# Patient Record
Sex: Male | Born: 1950 | Race: White | Hispanic: No | Smoking: Former smoker
Health system: Southern US, Community
[De-identification: ages and names within clinical notes are randomized; demographics above are authoritative.]

## PROBLEM LIST (undated history)

## (undated) DIAGNOSIS — M542 Cervicalgia: Secondary | ICD-10-CM

## (undated) DIAGNOSIS — I480 Paroxysmal atrial fibrillation: Secondary | ICD-10-CM

## (undated) DIAGNOSIS — M109 Gout, unspecified: Secondary | ICD-10-CM

## (undated) DIAGNOSIS — K219 Gastro-esophageal reflux disease without esophagitis: Secondary | ICD-10-CM

## (undated) DIAGNOSIS — E119 Type 2 diabetes mellitus without complications: Secondary | ICD-10-CM

## (undated) DIAGNOSIS — I340 Nonrheumatic mitral (valve) insufficiency: Secondary | ICD-10-CM

## (undated) DIAGNOSIS — L570 Actinic keratosis: Secondary | ICD-10-CM

## (undated) DIAGNOSIS — I729 Aneurysm of unspecified site: Secondary | ICD-10-CM

## (undated) DIAGNOSIS — I10 Essential (primary) hypertension: Secondary | ICD-10-CM

## (undated) DIAGNOSIS — F1010 Alcohol abuse, uncomplicated: Secondary | ICD-10-CM

## (undated) DIAGNOSIS — I499 Cardiac arrhythmia, unspecified: Secondary | ICD-10-CM

## (undated) DIAGNOSIS — I509 Heart failure, unspecified: Secondary | ICD-10-CM

## (undated) DIAGNOSIS — E785 Hyperlipidemia, unspecified: Secondary | ICD-10-CM

## (undated) DIAGNOSIS — G8929 Other chronic pain: Secondary | ICD-10-CM

## (undated) DIAGNOSIS — I63539 Cerebral infarction due to unspecified occlusion or stenosis of unspecified posterior cerebral artery: Secondary | ICD-10-CM

## (undated) HISTORY — PX: KNEE ARTHROSCOPY: SHX127

## (undated) HISTORY — DX: Heart failure, unspecified: I50.9

## (undated) HISTORY — DX: Hyperlipidemia, unspecified: E78.5

## (undated) HISTORY — DX: Cerebral infarction due to unspecified occlusion or stenosis of unspecified posterior cerebral artery: I63.539

## (undated) HISTORY — DX: Aneurysm of unspecified site: I72.9

## (undated) HISTORY — PX: SKIN CANCER EXCISION: SHX779

## (undated) HISTORY — DX: Actinic keratosis: L57.0

## (undated) HISTORY — PX: CARDIAC CATHETERIZATION: SHX172

---

## 2011-02-10 ENCOUNTER — Other Ambulatory Visit (HOSPITAL_COMMUNITY): Payer: Self-pay | Admitting: Chiropractic Medicine

## 2011-02-10 DIAGNOSIS — M79603 Pain in arm, unspecified: Secondary | ICD-10-CM

## 2011-02-10 DIAGNOSIS — M542 Cervicalgia: Secondary | ICD-10-CM

## 2011-02-13 ENCOUNTER — Ambulatory Visit (HOSPITAL_COMMUNITY)
Admission: RE | Admit: 2011-02-13 | Discharge: 2011-02-13 | Disposition: A | Discharge status: Home / Self Care | Payer: No Typology Code available for payment source | Source: Ambulatory Visit | Attending: Chiropractic Medicine | Admitting: Chiropractic Medicine

## 2011-02-13 DIAGNOSIS — M79603 Pain in arm, unspecified: Secondary | ICD-10-CM

## 2011-02-13 DIAGNOSIS — M542 Cervicalgia: Secondary | ICD-10-CM

## 2011-02-13 DIAGNOSIS — M47812 Spondylosis without myelopathy or radiculopathy, cervical region: Secondary | ICD-10-CM | POA: Insufficient documentation

## 2012-01-17 ENCOUNTER — Inpatient Hospital Stay (HOSPITAL_COMMUNITY): Payer: Self-pay

## 2012-01-17 ENCOUNTER — Encounter (HOSPITAL_COMMUNITY): Payer: Self-pay | Admitting: *Deleted

## 2012-01-17 ENCOUNTER — Emergency Department (HOSPITAL_COMMUNITY): Payer: Self-pay

## 2012-01-17 ENCOUNTER — Inpatient Hospital Stay (HOSPITAL_COMMUNITY)
Admission: EM | Admit: 2012-01-17 | Discharge: 2012-01-20 | DRG: 065 | Disposition: A | Discharge status: Home / Self Care | Payer: Self-pay | Attending: Internal Medicine | Admitting: Internal Medicine

## 2012-01-17 DIAGNOSIS — F1010 Alcohol abuse, uncomplicated: Secondary | ICD-10-CM

## 2012-01-17 DIAGNOSIS — I63539 Cerebral infarction due to unspecified occlusion or stenosis of unspecified posterior cerebral artery: Secondary | ICD-10-CM

## 2012-01-17 DIAGNOSIS — K219 Gastro-esophageal reflux disease without esophagitis: Secondary | ICD-10-CM | POA: Diagnosis present

## 2012-01-17 DIAGNOSIS — M542 Cervicalgia: Secondary | ICD-10-CM | POA: Diagnosis present

## 2012-01-17 DIAGNOSIS — E785 Hyperlipidemia, unspecified: Secondary | ICD-10-CM | POA: Diagnosis present

## 2012-01-17 DIAGNOSIS — Z85828 Personal history of other malignant neoplasm of skin: Secondary | ICD-10-CM

## 2012-01-17 DIAGNOSIS — I63531 Cerebral infarction due to unspecified occlusion or stenosis of right posterior cerebral artery: Secondary | ICD-10-CM | POA: Diagnosis present

## 2012-01-17 DIAGNOSIS — I509 Heart failure, unspecified: Secondary | ICD-10-CM | POA: Diagnosis present

## 2012-01-17 DIAGNOSIS — I5042 Chronic combined systolic (congestive) and diastolic (congestive) heart failure: Secondary | ICD-10-CM | POA: Diagnosis present

## 2012-01-17 DIAGNOSIS — Z79899 Other long term (current) drug therapy: Secondary | ICD-10-CM

## 2012-01-17 DIAGNOSIS — G8929 Other chronic pain: Secondary | ICD-10-CM | POA: Diagnosis present

## 2012-01-17 DIAGNOSIS — R29810 Facial weakness: Secondary | ICD-10-CM | POA: Diagnosis present

## 2012-01-17 DIAGNOSIS — I10 Essential (primary) hypertension: Secondary | ICD-10-CM

## 2012-01-17 DIAGNOSIS — I639 Cerebral infarction, unspecified: Secondary | ICD-10-CM

## 2012-01-17 DIAGNOSIS — H53469 Homonymous bilateral field defects, unspecified side: Secondary | ICD-10-CM | POA: Diagnosis present

## 2012-01-17 DIAGNOSIS — I7090 Unspecified atherosclerosis: Secondary | ICD-10-CM | POA: Diagnosis present

## 2012-01-17 DIAGNOSIS — F05 Delirium due to known physiological condition: Secondary | ICD-10-CM | POA: Diagnosis present

## 2012-01-17 DIAGNOSIS — H9319 Tinnitus, unspecified ear: Secondary | ICD-10-CM | POA: Diagnosis present

## 2012-01-17 DIAGNOSIS — I6789 Other cerebrovascular disease: Secondary | ICD-10-CM

## 2012-01-17 DIAGNOSIS — M549 Dorsalgia, unspecified: Secondary | ICD-10-CM | POA: Diagnosis present

## 2012-01-17 DIAGNOSIS — M109 Gout, unspecified: Secondary | ICD-10-CM | POA: Diagnosis present

## 2012-01-17 DIAGNOSIS — I6350 Cerebral infarction due to unspecified occlusion or stenosis of unspecified cerebral artery: Principal | ICD-10-CM | POA: Diagnosis present

## 2012-01-17 DIAGNOSIS — R279 Unspecified lack of coordination: Secondary | ICD-10-CM | POA: Diagnosis present

## 2012-01-17 HISTORY — DX: Alcohol abuse, uncomplicated: F10.10

## 2012-01-17 HISTORY — DX: Gout, unspecified: M10.9

## 2012-01-17 HISTORY — DX: Cervicalgia: M54.2

## 2012-01-17 HISTORY — DX: Gastro-esophageal reflux disease without esophagitis: K21.9

## 2012-01-17 HISTORY — DX: Other chronic pain: G89.29

## 2012-01-17 HISTORY — DX: Essential (primary) hypertension: I10

## 2012-01-17 LAB — COMPREHENSIVE METABOLIC PANEL
AST: 23 U/L (ref 0–37)
BUN: 11 mg/dL (ref 6–23)
CO2: 27 mEq/L (ref 19–32)
Calcium: 9.9 mg/dL (ref 8.4–10.5)
Creatinine, Ser: 0.87 mg/dL (ref 0.50–1.35)
GFR calc Af Amer: >90 mL/min (ref >90)
GFR calc non Af Amer: >90 mL/min (ref >90)
Total Bilirubin: 0.5 mg/dL (ref 0.3–1.2)

## 2012-01-17 LAB — CBC WITH DIFFERENTIAL/PLATELET
Hemoglobin: 15.6 g/dL (ref 13.0–17.0)
Lymphocytes Relative: 27 % (ref 12–46)
Lymphs Abs: 3.1 10*3/uL (ref 0.7–4.0)
MCH: 34.2 pg — ABNORMAL HIGH (ref 26.0–34.0)
Monocytes Relative: 7 % (ref 3–12)
Neutro Abs: 7.3 10*3/uL (ref 1.7–7.7)
Neutrophils Relative %: 64 % (ref 43–77)
Platelets: 352 10*3/uL (ref 150–400)
RBC: 4.56 MIL/uL (ref 4.22–5.81)
WBC: 11.4 10*3/uL — ABNORMAL HIGH (ref 4.0–10.5)

## 2012-01-17 LAB — ETHANOL: Alcohol, Ethyl (B): <11 mg/dL (ref 0–11)

## 2012-01-17 LAB — GLUCOSE, CAPILLARY: Glucose-Capillary: 119 mg/dL — ABNORMAL HIGH (ref 70–99)

## 2012-01-17 MED ORDER — SODIUM CHLORIDE 0.9 % IV SOLN
INTRAVENOUS | Status: DC
  Administered 2012-01-17: 18:00:00 via INTRAVENOUS
  Administered 2012-01-18: 1000 mL via INTRAVENOUS

## 2012-01-17 MED ORDER — SENNOSIDES-DOCUSATE SODIUM 8.6-50 MG PO TABS: 1.0000 | ORAL_TABLET | Freq: Every evening | ORAL | Status: DC | PRN

## 2012-01-17 MED ORDER — ASPIRIN 81 MG PO CHEW
81.0000 mg | CHEWABLE_TABLET | Freq: Once | ORAL | Status: AC
  Administered 2012-01-17: 81 mg via ORAL
  Filled 2012-01-17: qty 1

## 2012-01-17 MED ORDER — HEPARIN SODIUM (PORCINE) 5000 UNIT/ML IJ SOLN
5000.0000 [IU] | Freq: Three times a day (TID) | INTRAMUSCULAR | Status: DC
  Administered 2012-01-17 – 2012-01-19 (×7): 5000 [IU] via SUBCUTANEOUS
  Filled 2012-01-17 (×12): qty 1

## 2012-01-17 MED ORDER — ADULT MULTIVITAMIN W/MINERALS CH
1.0000 | ORAL_TABLET | Freq: Every day | ORAL | Status: DC
  Administered 2012-01-17 – 2012-01-20 (×4): 1 via ORAL
  Filled 2012-01-17 (×4): qty 1

## 2012-01-17 MED ORDER — THIAMINE HCL 100 MG/ML IJ SOLN
100.0000 mg | Freq: Every day | INTRAMUSCULAR | Status: DC
  Filled 2012-01-17 (×4): qty 1

## 2012-01-17 MED ORDER — LORAZEPAM 1 MG PO TABS: 1.0000 mg | ORAL_TABLET | Freq: Four times a day (QID) | ORAL | Status: AC | PRN

## 2012-01-17 MED ORDER — ONDANSETRON HCL 4 MG/2ML IJ SOLN: 4.0000 mg | Freq: Four times a day (QID) | INTRAMUSCULAR | Status: DC | PRN

## 2012-01-17 MED ORDER — ACETAMINOPHEN 325 MG PO TABS: 650.0000 mg | ORAL_TABLET | ORAL | Status: DC | PRN

## 2012-01-17 MED ORDER — LORAZEPAM 2 MG/ML IJ SOLN: 1.0000 mg | Freq: Four times a day (QID) | INTRAMUSCULAR | Status: AC | PRN

## 2012-01-17 MED ORDER — HYDROCODONE-ACETAMINOPHEN 5-325 MG PO TABS
1.0000 | ORAL_TABLET | Freq: Four times a day (QID) | ORAL | Status: DC | PRN
  Administered 2012-01-17 – 2012-01-20 (×9): 2 via ORAL
  Filled 2012-01-17 (×11): qty 2

## 2012-01-17 MED ORDER — GABAPENTIN 300 MG PO CAPS
600.0000 mg | ORAL_CAPSULE | Freq: Two times a day (BID) | ORAL | Status: DC
  Administered 2012-01-17 – 2012-01-20 (×7): 600 mg via ORAL
  Filled 2012-01-17 (×8): qty 2

## 2012-01-17 MED ORDER — ACETAMINOPHEN 650 MG RE SUPP: 650.0000 mg | RECTAL | Status: DC | PRN

## 2012-01-17 MED ORDER — ASPIRIN 81 MG PO CHEW
81.0000 mg | CHEWABLE_TABLET | Freq: Every day | ORAL | Status: DC
  Administered 2012-01-17 – 2012-01-18 (×2): 81 mg via ORAL
  Filled 2012-01-17 (×3): qty 1

## 2012-01-17 MED ORDER — CYCLOBENZAPRINE HCL 10 MG PO TABS
10.0000 mg | ORAL_TABLET | Freq: Once | ORAL | Status: AC
  Administered 2012-01-17: 10 mg via ORAL

## 2012-01-17 MED ORDER — VITAMIN B-1 100 MG PO TABS
100.0000 mg | ORAL_TABLET | Freq: Every day | ORAL | Status: DC
  Administered 2012-01-17 – 2012-01-20 (×4): 100 mg via ORAL
  Filled 2012-01-17 (×4): qty 1

## 2012-01-17 MED ORDER — CYCLOBENZAPRINE HCL 10 MG PO TABS
10.0000 mg | ORAL_TABLET | Freq: Every morning | ORAL | Status: DC
  Administered 2012-01-18 – 2012-01-20 (×3): 10 mg via ORAL
  Filled 2012-01-17 (×6): qty 1

## 2012-01-17 MED ORDER — ASPIRIN 300 MG RE SUPP
300.0000 mg | Freq: Every day | RECTAL | Status: DC
  Filled 2012-01-17: qty 1

## 2012-01-17 MED ORDER — ASPIRIN 325 MG PO TABS: 325.0000 mg | ORAL_TABLET | Freq: Every day | ORAL | Status: DC

## 2012-01-17 MED ORDER — FOLIC ACID 1 MG PO TABS
1.0000 mg | ORAL_TABLET | Freq: Every day | ORAL | Status: DC
  Administered 2012-01-17 – 2012-01-20 (×4): 1 mg via ORAL
  Filled 2012-01-17 (×4): qty 1

## 2012-01-17 MED ORDER — GABAPENTIN 300 MG PO CAPS
300.0000 mg | ORAL_CAPSULE | Freq: Every day | ORAL | Status: DC
  Administered 2012-01-17 – 2012-01-19 (×3): 300 mg via ORAL
  Filled 2012-01-17 (×4): qty 1

## 2012-01-17 NOTE — ED Notes (Signed)
Pt c/o equilibrium being off with slight dizziness. Pt c/o intermittent double vision, reports he had same issue about 10 days ago. Pt c/o neck pain 8/10, pt takes gabapentin and flexeril for neck pain daily. Equal hand grips, no facial droop. Pt reports he has also been having trouble walking since yesterday morning.

## 2012-01-17 NOTE — ED Notes (Signed)
Pt back from CT
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
## 2012-01-17 NOTE — Consult Note (Signed)
Referring Physician:  ED physician Chief Complaint:  Vision loss, ataxia, sensory loss LSN: yesterday 8: 00 AM HPI: This is a 61 y/o RHWM who has been having visual changes for past several days and it got worse yesterday and had some balance and  Co ordination issues. He did not seek medical help  And upon questioning he mentioned because he does not have health insurance. He mentioned he had some speech impairment. But his main concern was word finding difficulty, disoriented and confused around the house. He comes in today and head CT indicates PCA territory ischemic stroke He does not take ASA at home. He denied chest pain, no sob, no abdominal pain, no nausea, no vomiting, no tinnitus, no hearing loss, no speech impariment ( resolved), ataxia, poor coordination, no tick bites, no dysphagia, positive for visual changes.   tPA Given: No Because: 1) Outside the tPA window 2) head CT positive for stroke   Past Medical History  Diagnosis Date  . Hypertension   . Alcohol abuse     . Chronic back pain  History reviewed. No pertinent past surgical history.  Family history: Father died of emphysema, mother died of DVT     Social History:  reports that he has quit smoking. He does not have any smokeless tobacco history on file. He reports that he drinks alcohol. He reports that he does not use illicit drugs.  He is not married and lives alone, he works in [employer]  Allergies: No Known Allergies  Medications:  Scheduled:   . aspirin  81 mg Oral Once    ROS: All the 14 point review of systems were reviewed and pertinent are mentioned in the HPI  Physical Examination: Blood pressure 166/90, pulse 99, temperature 98.2 F (36.8 C), temperature source Oral, resp. rate 30, SpO2 97.00%.   Physical Exam: General: Awake, alert and follows commands HEENT:   EOMI, PEERLA  ( pupils are sluggish) , no nystagmus,  Mild ptosis on the left  Neck: Supple, no  JVD, no Carotid bruit, No lAD Lungs; CTA Heart; RRR,, no murmurs, rubs and or gallops Abdomen: bowel sounds present Skin: no rash Extremities : No edema   Neuro:  Mental Status: Alert, oriented, thought content appropriate.   Speech fluent without evidence of aphasia. Verbal Dyslexia without agraphia  Able to follow 3 step commands without difficulty.   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  No dysphagia, Cough and GAG + Tongue mildly deviated to the left NO HORNER SYNDROME   Motor: Right : Upper extremity   5/5    Left:     Upper extremity   4+/5 with drift  Lower extremity   5/5     Lower extremity   4+ /5  With drift Tone and bulk:normal tone throughout; no atrophy noted Sensory: Pinprick and light touch diminished on the right side  Including the face Deep Tendon Reflexes: 2+ and symmetric throughout Plantars: Right: downgoing   Left: downgoing Cerebellar: Intention tremor  And mild hemiparesis on the left,  ( secondary to posteroventral nucleus of thalamus involvment) also cerebral peduncle involvement    Laboratory Studies:  Basic Metabolic Panel:  Lab 01/17/12 4540  NA 138  K 3.7  CL 99  CO2 27  GLUCOSE 118*  BUN 11  CREATININE 0.87  CALCIUM 9.9  MG --  PHOS --    Liver Function Tests:  Lab 01/17/12 1206  AST 23  ALT 20  ALKPHOS 83  BILITOT 0.5  PROT 7.5  ALBUMIN 4.1   No results found for this basename: LIPASE:5,AMYLASE:5 in the last 168 hours No results found for this basename: AMMONIA:3 in the last 168 hours  CBC:  Lab 01/17/12 1206  WBC 11.4*  NEUTROABS 7.3  HGB 15.6  HCT 44.1  MCV 96.7  PLT 352    Cardiac Enzymes: No results found for this basename: CKTOTAL:5,CKMB:5,CKMBINDEX:5,TROPONINI:5 in the last 168  hours  BNP: No components found with this basename: POCBNP:5  CBG:  Lab 01/17/12 1153  GLUCAP 119*    Microbiology: No results found for this or any previous visit.  Coagulation Studies: No results found for this basename: LABPROT:5,INR:5 in the last 72 hours  Urinalysis: No results found for this basename: COLORURINE:2,APPERANCEUR:2,LABSPEC:2,PHURINE:2,GLUCOSEU:2,HGBUR:2,BILIRUBINUR:2,KETONESUR:2,PROTEINUR:2,UROBILINOGEN:2,NITRITE:2,LEUKOCYTESUR:2 in the last 168 hours  Lipid Panel: No results found for this basename: chol, trig, hdl, cholhdl, vldl, ldlcalc    HgbA1C:  No results found for this basename: HGBA1C    Urine Drug Screen:   No results found for this basename: labopia, cocainscrnur, labbenz, amphetmu, thcu, labbarb    Alcohol Level:  Lab 01/17/12 1206  ETH <11    Other results: EKG: normal EKG, normal sinus rhythm, unchanged from previous tracings.  Imaging: Ct Head Wo Contrast  01/17/2012  *RADIOLOGY REPORT*  Clinical Data:  visual disturbance  CT HEAD WITHOUT CONTRAST  Technique:  Contiguous axial images were obtained from the base of the skull through the vertex without contrast.  Comparison: None.  Findings: Wedge-shaped hypodense area in the right occipital lobe posteriorly with loss of gray-white matter differentiation and mild localized mass effect.  Appearance compatible with a subacute right PCA territory infarct.  No associated hemorrhage.  Cisterns patent. No hydrocephalus or extra-axial fluid collection.  No cerebellar abnormality demonstrated.  Orbits symmetric.  Mastoids and sinuses clear.  IMPRESSION: Subacute right PCA territory infarct.  Critical Value/emergent results were called by telephone at the time of interpretation on 01/17/2012 at 12:30 p.m. to Dr. Rulon Abide, who verbally acknowledged these results.   Original Report Authenticated By: Judie Petit. Ruel Favors, M.D.     Assessment: Patient with Right PCA ischemic stroke on CT. Not a tPA candidate,  Does not take ASA at home  Recommendations: 1) Admit for stroke work up 2) MRI of the brain, MRA of the head and neck,  TTE,   3) ASA 81 mg daily 4) Do not treat the bp unless it is > 220/120 for the next 48 hours. His symptoms started 24 hours ago 5) Stroke labs, 6) PT/OT/ST 7) fall and aspiration precautions   Approximately 60 minutes spent with the patient  Maxon Kresse V-P Eilleen Kempf., MD., Ph.D.,MS 01/17/2012 2:07 PM

## 2012-01-17 NOTE — ED Notes (Addendum)
Dr. Parekh at the bedside.  

## 2012-01-17 NOTE — ED Notes (Signed)
Pt in radiology 

## 2012-01-18 ENCOUNTER — Inpatient Hospital Stay (HOSPITAL_COMMUNITY): Payer: Self-pay

## 2012-01-18 DIAGNOSIS — I63539 Cerebral infarction due to unspecified occlusion or stenosis of unspecified posterior cerebral artery: Secondary | ICD-10-CM | POA: Diagnosis present

## 2012-01-18 DIAGNOSIS — E785 Hyperlipidemia, unspecified: Secondary | ICD-10-CM

## 2012-01-18 DIAGNOSIS — M4802 Spinal stenosis, cervical region: Secondary | ICD-10-CM | POA: Insufficient documentation

## 2012-01-18 DIAGNOSIS — I729 Aneurysm of unspecified site: Secondary | ICD-10-CM | POA: Insufficient documentation

## 2012-01-18 DIAGNOSIS — I6789 Other cerebrovascular disease: Secondary | ICD-10-CM

## 2012-01-18 DIAGNOSIS — I6350 Cerebral infarction due to unspecified occlusion or stenosis of unspecified cerebral artery: Principal | ICD-10-CM

## 2012-01-18 HISTORY — DX: Cerebral infarction due to unspecified occlusion or stenosis of unspecified posterior cerebral artery: I63.539

## 2012-01-18 HISTORY — DX: Aneurysm of unspecified site: I72.9

## 2012-01-18 HISTORY — DX: Hyperlipidemia, unspecified: E78.5

## 2012-01-18 LAB — URINALYSIS, ROUTINE W REFLEX MICROSCOPIC
Glucose, UA: NEGATIVE mg/dL
Ketones, ur: NEGATIVE mg/dL
Leukocytes, UA: NEGATIVE
Nitrite: NEGATIVE
Specific Gravity, Urine: 1.02 (ref 1.005–1.030)
pH: 6.5 (ref 5.0–8.0)

## 2012-01-18 LAB — BASIC METABOLIC PANEL
CO2: 27 mEq/L (ref 19–32)
Chloride: 103 mEq/L (ref 96–112)
Creatinine, Ser: 0.92 mg/dL (ref 0.50–1.35)
Glucose, Bld: 93 mg/dL (ref 70–99)
Sodium: 138 mEq/L (ref 135–145)

## 2012-01-18 LAB — LIPID PANEL
LDL Cholesterol: 121 mg/dL — ABNORMAL HIGH (ref 0–99)
Total CHOL/HDL Ratio: 6.1 RATIO
VLDL: 48 mg/dL — ABNORMAL HIGH (ref 0–40)

## 2012-01-18 LAB — CBC WITH DIFFERENTIAL/PLATELET
Eosinophils Relative: 3 % (ref 0–5)
HCT: 42.4 % (ref 39.0–52.0)
Lymphs Abs: 3.4 10*3/uL (ref 0.7–4.0)
MCV: 97 fL (ref 78.0–100.0)
Monocytes Relative: 9 % (ref 3–12)
Neutro Abs: 5.8 10*3/uL (ref 1.7–7.7)
RBC: 4.37 MIL/uL (ref 4.22–5.81)
WBC: 10.5 10*3/uL (ref 4.0–10.5)

## 2012-01-18 LAB — TSH: TSH: 1.642 u[IU]/mL (ref 0.350–4.500)

## 2012-01-18 LAB — HEMOGLOBIN A1C
Hgb A1c MFr Bld: 5.7 % — ABNORMAL HIGH (ref <5.7)
Mean Plasma Glucose: 117 mg/dL — ABNORMAL HIGH (ref <117)

## 2012-01-18 LAB — RAPID URINE DRUG SCREEN, HOSP PERFORMED: Barbiturates: NOT DETECTED

## 2012-01-18 MED ORDER — SIMVASTATIN 20 MG PO TABS
20.0000 mg | ORAL_TABLET | Freq: Every day | ORAL | Status: DC
  Administered 2012-01-18 – 2012-01-20 (×3): 20 mg via ORAL
  Filled 2012-01-18 (×3): qty 1

## 2012-01-18 MED ORDER — CLOPIDOGREL BISULFATE 75 MG PO TABS
75.0000 mg | ORAL_TABLET | Freq: Every day | ORAL | Status: DC
  Administered 2012-01-18 – 2012-01-20 (×3): 75 mg via ORAL
  Filled 2012-01-18 (×5): qty 1
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
## 2012-01-18 NOTE — Evaluation (Signed)
Occupational Therapy Evaluation Patient Details Name: Austin Andrade MRN: [id number] DOB: [birth year] Today's Date: 01/18/2012 Time: 1478-2956 OT Time Calculation (min): 24 min  OT Assessment / Plan / Recommendation Clinical Impression  Pt admitted with ataxia, sensory loss and visual changes.  Per MD note, MRI findings show: Concurrent tiny small acute infarct in the left cerebellum.Right PCA is occluded just beyond its origin with no reconstituted flow. 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 Right vertebral artery terminates in PICA.  Will benefit from acute OT services to address below problem list in prep for d/c home.    OT Assessment  Patient needs continued OT Services    Follow Up Recommendations  Outpatient OT    Barriers to Discharge Decreased caregiver support Pt unsure of level of assist he can obtain from friends/family but states he thinks he can have someone drive him to OPOT.  Equipment Recommendations  None recommended by OT    Recommendations for Other Services    Frequency  Min 2X/week    Precautions / Restrictions Precautions Precautions:  (left visual field deficit)   Pertinent Vitals/Pain See vitals    ADL  Grooming: Performed;Wash/dry hands;Supervision/safety Where Assessed - Grooming: Unsupported standing Toilet Transfer: Min guard;Performed Statistician Method: Sit to Barista: Comfort height toilet Toileting - Architect and Hygiene: Performed;Supervision/safety Where Assessed - Engineer, mining and Hygiene: Standing Equipment Used: Gait belt Transfers/Ambulation Related to ADLs: min guard during ambulation.  Slight LOB to left side when looking right.  ADL Comments: Pt is near baseline but demonstrates balance deficits during functional transfers as well as left visual field deficit. Edcuated pt on  safety concern with visual deficit as well as to wait until cleared by MD to begin driving.    OT Diagnosis: Disturbance of vision;Generalized weakness;Paresis  OT Problem List: Decreased activity tolerance;Impaired balance (sitting and/or standing);Impaired vision/perception;Impaired sensation OT Treatment Interventions: Self-care/ADL training;Therapeutic activities;Visual/perceptual remediation/compensation;Patient/family education;Balance training   OT Goals Acute Rehab OT Goals OT Goal Formulation: With patient Time For Goal Achievement: 01/25/12 Potential to Achieve Goals: Good ADL Goals Pt Will Transfer to Toilet: Independently;Ambulation;Regular height toilet ADL Goal: Toilet Transfer - Progress: Goal set today Miscellaneous OT Goals Miscellaneous OT Goal #1: Pt will independently retrieve ADL items located throughout room with no LOB. OT Goal: Miscellaneous Goal #1 - Progress: Goal set today Miscellaneous OT Goal #2: Pt will independently use visual compensatory strategies at all times to locate objects in environment. OT Goal: Miscellaneous Goal #2 - Progress: Goal set today  Visit Information  Last OT Received On: 01/18/12 Assistance Needed: +1    Subjective Data  Subjective: I feel sort of dizzy.   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) Communication Communication: No difficulties Dominant Hand: Right           Vision/Perception Vision - Assessment Eye Alignment: Within Functional Limits Vision Assessment: Vision tested Tracking/Visual Pursuits: Decreased smoothness of eye movement to LEFT superior field;Decreased smoothness of eye  movement to LEFT inferior field Saccades: Decreased speed of saccadic movement Visual Fields: Left homonymous hemianopsia;Impaired - to be further tested in functional context Diplopia Assessment:  (not present during this session but has occured recently) Additional Comments: Pt with possible left homonymous hemianopia.  Will continue to assess. Educated pt to scan environment thoroughly by turning head to left to ensure safety and to locate all objects.   Cognition  Overall Cognitive Status: Appears within functional limits for tasks assessed/performed Arousal/Alertness: Awake/alert Orientation Level: Oriented X4 / Intact Behavior During Session: [facility] for tasks performed    Extremity/Trunk Assessment Right Upper Extremity Assessment RUE ROM/Strength/Tone: Within functional levels RUE Sensation: WFL - Light Touch;WFL - Proprioception RUE Coordination: WFL - gross/fine motor Left Upper Extremity Assessment LUE ROM/Strength/Tone: Within functional levels LUE Sensation: Deficits LUE Sensation Deficits: numbness and tingling LUE Coordination: WFL - gross/fine motor     Mobility Bed Mobility Bed Mobility: Supine to Sit;Sitting - Scoot to Edge of Bed Supine to Sit: 7: Independent Sitting - Scoot to Edge of Bed: 7: Independent Transfers Sit to Stand: 5: Supervision;Without upper extremity assist;From bed;From chair/3-in-1 Stand to Sit: 5: Supervision;Without upper extremity assist;To chair/3-in-1 Details for Transfer Assistance: supervision due to reports of head feeling "wishy-washy"     Shoulder Instructions     Exercise     Balance     End of Session OT - End of Session Equipment Utilized During Treatment: Gait belt Activity Tolerance: Patient tolerated  treatment well Patient left: in chair;with call bell/phone within reach (with PT) Nurse Communication: Mobility status  GO   01/18/2012 Austin Andrade OTR/L Pager [phone] Office [phone]   Austin Andrade 01/18/2012, 4:39 PM

## 2012-01-18 NOTE — Progress Notes (Addendum)
BP remains elevated. Dr Shirlee Latch (medical resident with Dr Orvan Falconer) aware, pt BP must be untreated first 48hrs. No orders at this time.

## 2012-01-18 NOTE — Progress Notes (Signed)
  Echocardiogram 2D Echocardiogram has been performed.  Austin Andrade 01/18/2012, 9:26 AM
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
## 2012-01-18 NOTE — Progress Notes (Signed)
Patient ID: Austin Andrade, male   DOB: May 15, 1950, 61 y.o.   MRN: [id number]    Internal Medicine Teaching Program   Date of Admission:  01/17/2012     Principal Problem:  *Acute ischemic multifocal posterior circulation stroke Active Problems:  Left homonymous hemianopsia  Sensory deficit, left  Hypertension  Chronic neck pain  H/O alcohol abuse  Dyslipidemia      . aspirin  81 mg Oral Once  . clopidogrel  75 mg Oral Q breakfast  . cyclobenzaprine  10 mg Oral q morning - 10a  . cyclobenzaprine  10 mg Oral Once  . folic acid  1 mg Oral Daily  . gabapentin  300 mg Oral QHS  . gabapentin  600 mg Oral BID WC  . heparin  5,000 Units Subcutaneous Q8H  . multivitamin with minerals  1 tablet Oral Daily  . simvastatin  20 mg Oral q1800  . thiamine  100 mg Oral Daily   Or  . thiamine  100 mg Intravenous Daily  . DISCONTD: aspirin  81 mg Oral Daily  . DISCONTD: aspirin  300 mg Rectal Daily  . DISCONTD: aspirin  325 mg Oral Daily    Subjective: I have seen and examined Austin Andrade and discussed his assessment and initial management with my medical team. He had 2 brief episodes of vertical diplopia about 12 days ago. Over the last 3-4 days he has felt some "disequilibrium" when standing and walking. More recently he developed some vision loss in his left eye prompting him to come to the emergency department yesterday where a noncontrasted CT scan showed a right posterior cerebral artery ischemic stroke. His blood pressure was elevated on admission but is down slightly today. He has been told in the past that he had high blood pressure but he has not been on and he medications and has not been seen a primary care physician recently. He is a former cigarette smoker having quit about 6 years ago. He currently works as a Event organiser. He states that his disequilibrium and visual loss is about the same today as it has been for the past 2 days.  Objective: Temp:  [97.6 F (36.4 C)-98.7  F (37.1 C)] 97.8 F (36.6 C) (10/02 1333) Pulse Rate:  [80-89] 83  (10/02 1333) Resp:  [15-18] 16  (10/02 1333) BP: (166-183)/(84-100) 176/90 mmHg (10/02 1333) SpO2:  [94 %-100 %] 97 % (10/02 1333) Weight:  [74.844 kg (165 lb)] 74.844 kg (165 lb) (10/01 1550)  General: Alert and in no distress with normal speech Skin: No rash, conjunctival or splinter hemorrhages Lungs: Clear Cor: Regular S1 and S2 no murmurs Abdomen: Obese, soft and nontender Neuro: alert and oriented. No focal weakness noted. He does have a temporal field cut in his left eye. Pupils are equal, round and reactive to light. He has decrease sensation on his left face and left arm.  Lab Results Lab Results  Component Value Date   WBC 10.5 01/18/2012   HGB 14.7 01/18/2012   HCT 42.4 01/18/2012   MCV 97.0 01/18/2012   PLT 321 01/18/2012    Lab Results  Component Value Date   CREATININE 0.92 01/18/2012   BUN 10 01/18/2012   NA 138 01/18/2012   K 4.0 01/18/2012   CL 103 01/18/2012   CO2 27 01/18/2012    Lab Results  Component Value Date   ALT 20 01/17/2012   AST 23 01/17/2012   ALKPHOS 83 01/17/2012   BILITOT 0.5  01/17/2012      Microbiology: No results found for this or any previous visit (from the past 240 hour(s)).  Studies/Results: Dg Chest 2 View  01/17/2012  *RADIOLOGY REPORT*  Clinical Data: Stroke, hypertension  CHEST - 2 VIEW  Comparison: None.  Findings: Normal heart size and vascularity.  No focal pneumonia, collapse, consolidation, airspace disease, edema, effusion or pneumothorax.  Trachea is midline.  Healed posterior right rib fractures.  IMPRESSION: No acute chest finding.   Original Report Authenticated By: Judie Petit. Ruel Favors, M.D.    Ct Head Wo Contrast  01/17/2012  *RADIOLOGY REPORT*  Clinical Data:  visual disturbance  CT HEAD WITHOUT CONTRAST  Technique:  Contiguous axial images were obtained from the base of the skull through the vertex without contrast.  Comparison: None.  Findings: Wedge-shaped  hypodense area in the right occipital lobe posteriorly with loss of gray-white matter differentiation and mild localized mass effect.  Appearance compatible with a subacute right PCA territory infarct.  No associated hemorrhage.  Cisterns patent. No hydrocephalus or extra-axial fluid collection.  No cerebellar abnormality demonstrated.  Orbits symmetric.  Mastoids and sinuses clear.  IMPRESSION: Subacute right PCA territory infarct.  Critical Value/emergent results were called by telephone at the time of interpretation on 01/17/2012 at 12:30 p.m. to Dr. Rulon Abide, who verbally acknowledged these results.   Original Report Authenticated By: Judie Petit. Ruel Favors, M.D.    Mr Brain Wo Contrast  01/18/2012  *RADIOLOGY REPORT*  Clinical Data:  61 year old male with visual disturbance.  PCA stroke.  Comparison: Head CT without contrast 01/17/2012.  Cervical MRI 02/13/2011.  MRI HEAD WITHOUT CONTRAST  Technique: Multiplanar, multiecho pulse sequences of the brain and surrounding structures were obtained according to standard protocol without intravenous contrast.  Findings: Confluent restricted diffusion throughout the right PCA territory involving the medial right occipital lobe, posterior right mesial temporal lobe and hippocampal complex, and with patchy involvement of the right thalamus.  Cytotoxic edema and T2 and FLAIR hyperintensity throughout the affected areas.  No petechial hemorrhage or significant mass effect.  There is also a punctate focus of restricted effusion in the left central cerebellum (series 4 image 6).  Subtle T2 and FLAIR hyperintensity here.  No other posterior fossa restricted diffusion.  No left supratentorial hemisphere diffusion abnormality.  Major intracranial vascular flow voids are preserved, MRA findings are below.  No ventriculomegaly. No acute intracranial hemorrhage identified. No midline shift or mass lesion.  Negative pituitary, cervicomedullary junction and visualized cervical spine.  Normal  marrow signal.  Normal for age gray and white matter signal outside of the acute findings.  Orbit soft tissues and scalp soft tissues are within normal limits. Mild paranasal sinus mucosal thickening and/or occasional small retention cyst.  Mastoids are clear.  IMPRESSION: 1.  Confluent acute right PCA infarct.  Edema without hemorrhage or significant mass effect. 2. Concurrent tiny small acute infarct in the left cerebellum. 3.  See MRA findings below.  MRA HEAD WITHOUT CONTRAST  Technique: Angiographic images of the Circle of Willis were obtained using MRA technique without  intravenous contrast.  Findings: Antegrade flow in the posterior circulation.  The distal right vertebral artery appears patent but terminates in the right PICA.  The left vertebral artery supplies the basilar.  Minimal irregularity of the distal left vertebral artery without stenosis. Probable dominant left AICA.  No basilar stenosis.  SCA and left PCA origins are within normal limits.  The basilar tip is patent, but the right PCA is occluded just beyond its  origin (series 802 image 10).  No definite posterior communicating artery, a diminutive right anterior choroidal artery is visible.  No reconstituted PCA flow is identified.  Left PCA branches are within normal limits.  The left posterior communicating artery appears to be present.  Antegrade flow in both ICA siphons.  No ICA stenosis.  On the right, the ophthalmic artery origin is within normal limits.  A small inferior outpouching of the supraclinoid right ICA is suspected to be an infundibulum.  On the left there is a small 3 mm saccular lesion arising anteriorly from the proximal cavernous ICA (series 8 image 87).  An infundibulum is suspected at the left ophthalmic artery origin. The left posterior communicating artery origin is within normal limits.  Both carotid termini normal.  The MCA and ACA origins are normal. The anterior communicating artery is diminutive or absent.  Visualized ACA branches are within normal limits.  Visualized bilateral MCA branches are within normal limits; simplified branching pattern on the right.  IMPRESSION: 1.  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 2.  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 3.  Probable small infundibula at the left ophthalmic artery origin and right supraclinoid ICA. 4.  Right vertebral artery terminates in PICA.   Original Report Authenticated By: Harley Hallmark, M.D.    Mr Mra Head/brain Wo Cm  01/18/2012  *RADIOLOGY REPORT*  Clinical Data:  61 year old male with visual disturbance.  PCA stroke.  Comparison: Head CT without contrast 01/17/2012.  Cervical MRI 02/13/2011.  MRI HEAD WITHOUT CONTRAST  Technique: Multiplanar, multiecho pulse sequences of the brain and surrounding structures were obtained according to standard protocol without intravenous contrast.  Findings: Confluent restricted diffusion throughout the right PCA territory involving the medial right occipital lobe, posterior right mesial temporal lobe and hippocampal complex, and with patchy involvement of the right thalamus.  Cytotoxic edema and T2 and FLAIR hyperintensity throughout the affected areas.  No petechial hemorrhage or significant mass effect.  There is also a punctate focus of restricted effusion in the left central cerebellum (series 4 image 6).  Subtle T2 and FLAIR hyperintensity here.  No other posterior fossa restricted diffusion.  No left supratentorial hemisphere diffusion abnormality.  Major intracranial vascular flow voids are preserved, MRA findings are below.  No ventriculomegaly. No acute intracranial hemorrhage identified. No midline shift or mass lesion.  Negative pituitary, cervicomedullary junction and visualized cervical spine.  Normal marrow signal.  Normal for age gray and white matter signal outside of the acute findings.  Orbit soft tissues and scalp soft tissues are within normal limits.  Mild paranasal sinus mucosal thickening and/or occasional small retention cyst.  Mastoids are clear.  IMPRESSION: 1.  Confluent acute right PCA infarct.  Edema without hemorrhage or significant mass effect. 2. Concurrent tiny small acute infarct in the left cerebellum. 3.  See MRA findings below.  MRA HEAD WITHOUT CONTRAST  Technique: Angiographic images of the Circle of Willis were obtained using MRA technique without  intravenous contrast.  Findings: Antegrade flow in the posterior circulation.  The distal right vertebral artery appears patent but terminates in the right PICA.  The left vertebral artery supplies the basilar.  Minimal irregularity of the distal left vertebral artery without stenosis. Probable dominant left AICA.  No basilar stenosis.  SCA and left PCA origins are within normal limits.  The basilar tip is patent, but the right PCA is occluded just beyond its origin (series 802 image 10).  No definite posterior communicating artery, a diminutive right anterior choroidal artery is visible.  No reconstituted PCA flow is identified.  Left PCA branches are within normal limits.  The left posterior communicating artery appears to be present.  Antegrade flow in both ICA siphons.  No ICA stenosis.  On the right, the ophthalmic artery origin is within normal limits.  A small inferior outpouching of the supraclinoid right ICA is suspected to be an infundibulum.  On the left there is a small 3 mm saccular lesion arising anteriorly from the proximal cavernous ICA (series 8 image 87).  An infundibulum is suspected at the left ophthalmic artery origin. The left posterior communicating artery origin is within normal limits.  Both carotid termini normal.  The MCA and ACA origins are normal. The anterior communicating artery is diminutive or absent. Visualized ACA branches are within normal limits.  Visualized bilateral MCA branches are within normal limits; simplified branching pattern on the right.  IMPRESSION: 1.   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 2.  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 3.  Probable small infundibula at the left ophthalmic artery origin and right supraclinoid ICA. 4.  Right vertebral artery terminates in PICA.   Original Report Authenticated By: Harley Hallmark, M.D.     Assessment: His MRI shows the right posterior cerebral artery ischemic stroke and a small, acute left cerebellar stroke. The MRA shows sharp cut off of the right posterior cerebral artery. These findings suggest the possibility of cardiac embolic source for bihemispheric strokes. He has no evidence for endocarditis clinically and, so far, no known reason to have intracardiac thrombus. I agree with transesophageal echocardiography to look for the sources as well as for patent foramen ovale. His blood pressure is still elevated but we will wait another 24 hours before making a decision about treatment. He does have dyslipidemia and we will start pravastatin. He is already on low-dose aspirin.  Plan: 1. Check TEE 2. Check hemoglobin A1c 3. Low-dose aspirin and pravastatin 4. Monitor her blood pressure  Cliffton Asters, MD Internal Medicine Teaching Program [facility] [phone] pager   [phone] cell 01/18/2012, 1:41 PM

## 2012-01-18 NOTE — Evaluation (Signed)
Physical Therapy Evaluation Patient Details Name: Austin Andrade MRN: [id number] DOB: 19-Jun-1950 Today's Date: 01/18/2012 Time: 4098-1191 PT Time Calculation (min): 27 min  PT Assessment / Plan / Recommendation Clinical Impression  61 yo s/p Rt PCA infarct, Rt PCA occluded, and Lt cerebellar CVA. Pt with residual visual, sensory, and balance deficits and will benefit from PT to incr independence.    PT Assessment  Patient needs continued PT services    Follow Up Recommendations  Outpatient PT    Barriers to Discharge Decreased caregiver support      Equipment Recommendations  None recommended by PT    Recommendations for Other Services     Frequency Min 4X/week    Precautions / Restrictions Precautions Precautions: Other (comment) (Lt visual field deficit)   Pertinent Vitals/Pain No pain      Mobility  Bed Mobility Bed Mobility: Supine to Sit;Sitting - Scoot to Edge of Bed Supine to Sit: 7: Independent Sitting - Scoot to Edge of Bed: 7: Independent Transfers Transfers: Sit to Stand;Stand to Sit Sit to Stand: 5: Supervision;Without upper extremity assist;From bed;From chair/3-in-1 Stand to Sit: 5: Supervision;Without upper extremity assist;To chair/3-in-1 Details for Transfer Assistance: supervision due to reports of head feeling "wishy-washy" Ambulation/Gait Ambulation/Gait Assistance: 4: Min guard Ambulation Distance (Feet): 180 Feet Assistive device: None Ambulation/Gait Assistance Details: maintained straight path when looking forward, deviated to Lt when looking to Rt while walking (able to maintain balance without assist, but min guard required) Gait Pattern: Wide base of support Modified Rankin (Stroke Patients Only) Pre-Morbid Rankin Score: No symptoms Modified Rankin: Moderate disability    Shoulder Instructions     Exercises     PT Diagnosis: Difficulty walking  PT Problem List: Decreased balance;Decreased mobility;Impaired sensation PT Treatment  Interventions: Gait training;Stair training;Functional mobility training;Therapeutic activities;Balance training;Neuromuscular re-education;Patient/family education   PT Goals Acute Rehab PT Goals PT Goal Formulation: With patient Time For Goal Achievement: 01/21/12 Potential to Achieve Goals: Good Pt will go Sit to Stand: Independently PT Goal: Sit to Stand - Progress: Goal set today Pt will Ambulate: >150 feet;Independently;with modified independence PT Goal: Ambulate - Progress: Goal set today Pt will Go Up / Down Stairs: 1-2 stairs;Independently PT Goal: Up/Down Stairs - Progress: Goal set today Pt will Perform Home Exercise Program: with supervision, verbal cues required/provided PT Goal: Perform Home Exercise Program - Progress: Goal set today  Visit Information  Last PT Received On: 01/18/12 Assistance Needed: +1    Subjective Data  Subjective: reports he felt light headed--denied vertigo Patient Stated Goal: return to independent status; driving again   Prior Functioning  Home Living Lives With: Alone Available Help at Discharge: Other (Comment) (may be able to arrange help) Type of Home: House Home Access: Stairs to enter [employer] of Steps: 2 Entrance Stairs-Rails: None Home Layout: One level Bathroom Shower/Tub: Engineer, manufacturing systems: Standard Bathroom Accessibility: Yes How Accessible: Accessible via walker ("it would be tight") Home Adaptive Equipment: None Prior Function Level of Independence: Independent Able to Take Stairs?: Reciprically Driving: Yes 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)    Cognition  Overall Cognitive Status: Appears within functional limits for tasks assessed/performed Arousal/Alertness: Awake/alert Orientation Level: Oriented X4 / Intact Behavior During Session: WFL for tasks performed    Extremity/Trunk Assessment Right Lower Extremity Assessment RLE ROM/Strength/Tone: Within  functional levels RLE Sensation: WFL - Light Touch RLE Coordination: WFL - gross motor Left Lower Extremity Assessment LLE ROM/Strength/Tone: Within functional levels LLE Sensation: Deficits LLE Sensation Deficits: numbness compared  to light touch on RLE (throughout entire LLE) LLE Coordination: WFL - gross motor Trunk Assessment Trunk Assessment: Normal   Balance Balance Balance Assessed: Yes Standardized Balance Assessment Standardized Balance Assessment: Berg Balance Test;Dynamic Gait Index Berg Balance Test Sit to Stand: Able to stand without using hands and stabilize independently Standing Unsupported: Able to stand safely 2 minutes Sitting with Back Unsupported but Feet Supported on Floor or Stool: Able to sit safely and securely 2 minutes Stand to Sit: Sits safely with minimal use of hands Transfers: Able to transfer safely, minor use of hands Standing Unsupported with Eyes Closed: Able to stand 10 seconds safely Standing Ubsupported with Feet Together: Able to place feet together independently and stand 1 minute safely From Standing, Reach Forward with Outstretched Arm: Can reach confidently >25 cm (10") From Standing Position, Pick up Object from Floor: Able to pick up shoe safely and easily From Standing Position, Turn to Look Behind Over each Shoulder: Looks behind from both sides and weight shifts well Turn 360 Degrees: Able to turn 360 degrees safely in 4 seconds or less Standing Unsupported, One Foot in Front: Loses balance while stepping or standing Standing on One Leg: Unable to try or needs assist to prevent fall Dynamic Gait Index Level Surface: Normal Change in Gait Speed: Normal Gait with Horizontal Head Turns: Mild Impairment Gait with Vertical Head Turns: Mild Impairment Gait and Pivot Turn: Normal  End of Session PT - End of Session Equipment Utilized During Treatment: Gait belt Activity Tolerance: Patient tolerated treatment well Patient left: in  chair;with call bell/phone within reach Nurse Communication: Mobility status  GP     Elfriede Bonini 01/18/2012, 12:26 PM  Pager [phone]

## 2012-01-18 NOTE — Care Management Note (Signed)
    Page 1 of 1   01/23/2012     3:25:41 PM   CARE MANAGEMENT NOTE 01/23/2012  Patient:  Austin Andrade   Account Number:  [id number]  Date Initiated:  01/18/2012  Documentation initiated by:  Onnie Boer  Subjective/Objective Assessment:   PT WAS ADMITTED WITH WEAKNESS AND BLURRED VISION     Action/Plan:   PROGRESSION OF CARE AND DISCHARGE PLANNING   Anticipated DC Date:  01/20/2012   Anticipated DC Plan:        DC Planning Services  CM consult      Choice offered to / List presented to:  C-1 Patient        HH arranged  HH-2 PT  HH-3 OT      HH agency  [facility]   Status of service:  Completed, signed off Medicare Important Message given?   (If response is "NO", the following Medicare IM given date fields will be blank) Date Medicare IM given:   Date Additional Medicare IM given:    Discharge Disposition:  HOME W HOME HEALTH SERVICES  Per UR Regulation:  Reviewed for med. necessity/level of care/duration of stay  If discussed at Long Length of Stay Meetings, dates discussed:    Comments:  01/18/12 Onnie Boer, RN, BSN 1117 PT WAS ADMITTED WITH CVA.  PTA PT WAS AT HOME WITH SELF CARE.  WILL F/U WITH DC NEEDS AND RECOMMENDATIONS.

## 2012-01-18 NOTE — Progress Notes (Signed)
Stroke Team Progress Note  HISTORY  This is a 61 y/o RHWM who has been having visual changes for past several days and it got worse yesterday and had some balance and Co ordination issues. He did not seek medical help And upon questioning he mentioned because he does not have health insurance. He mentioned he had some speech impairment. But his main concern was word finding difficulty, disoriented and confused around the house. He comes in today and head CT indicates PCA territory ischemic stroke   He does not take ASA at home.  He denied chest pain, no sob, no abdominal pain, no nausea, no vomiting, no tinnitus, no hearing loss, no speech impariment ( resolved), ataxia, poor coordination, no tick bites, no dysphagia, positive for visual changes  SUBJECTIVE His spouse is at the bedside.  Overall he feels his condition is quickly resolving.  OBJECTIVE Most recent Vital Signs: Filed Vitals:   01/18/12 0030 01/18/12 0200 01/18/12 0437 01/18/12 0631  BP: 168/90 183/93 172/86 176/84  Pulse: 86 86 80 86  Temp: 98.6 F (37 C) 98.3 F (36.8 C) 98.7 F (37.1 C) 97.8 F (36.6 C)  TempSrc: Oral Oral  Oral  Resp: 18 18 18 18   Height:      Weight:      SpO2: 96% 96% 95% 94%   CBG (last 3)   Basename 01/17/12 1153  GLUCAP 119*   Intake/Output from previous day: 10/01 0701 - 10/02 0700 In: -  Out: 500 [Urine:500]  IV Fluid Intake:     . sodium chloride 100 mL/hr at 01/17/12 1822    MEDICATIONS    . aspirin  81 mg Oral Once  . aspirin  81 mg Oral Daily  . cyclobenzaprine  10 mg Oral q morning - 10a  . cyclobenzaprine  10 mg Oral Once  . folic acid  1 mg Oral Daily  . gabapentin  300 mg Oral QHS  . gabapentin  600 mg Oral BID WC  . heparin  5,000 Units Subcutaneous Q8H  . multivitamin with minerals  1 tablet Oral Daily  . thiamine  100 mg Oral Daily   Or  . thiamine  100 mg Intravenous Daily  . DISCONTD: aspirin  300 mg Rectal Daily  . DISCONTD: aspirin  325 mg Oral Daily   PRN:  acetaminophen, acetaminophen, HYDROcodone-acetaminophen, LORazepam, LORazepam, ondansetron (ZOFRAN) IV, senna-docusate  Diet:  Sodium Restricted thin liquids Activity:  Up with assistance DVT Prophylaxis:  SCD  CLINICALLY SIGNIFICANT STUDIES Basic Metabolic Panel:  Lab 01/18/12 1191 01/17/12 1206  NA 138 138  K 4.0 3.7  CL 103 99  CO2 27 27  GLUCOSE 93 118*  BUN 10 11  CREATININE 0.92 0.87  CALCIUM 9.3 9.9  MG -- --  PHOS -- --   Liver Function Tests:  Lab 01/17/12 1206  AST 23  ALT 20  ALKPHOS 83  BILITOT 0.5  PROT 7.5  ALBUMIN 4.1   CBC:  Lab 01/18/12 0530 01/17/12 1206  WBC 10.5 11.4*  NEUTROABS 5.8 7.3  HGB 14.7 15.6  HCT 42.4 44.1  MCV 97.0 96.7  PLT 321 352   Urinalysis:  Lab 01/18/12 0519  COLORURINE YELLOW  LABSPEC 1.020  PHURINE 6.5  GLUCOSEU NEGATIVE  HGBUR NEGATIVE  BILIRUBINUR NEGATIVE  KETONESUR NEGATIVE  PROTEINUR NEGATIVE  UROBILINOGEN 1.0  NITRITE NEGATIVE  LEUKOCYTESUR NEGATIVE   Lipid Panel    Component Value Date/Time   CHOL 202* 01/18/2012 0530   TRIG 238* 01/18/2012 0530  HDL 33* 01/18/2012 0530   CHOLHDL 6.1 01/18/2012 0530   VLDL 48* 01/18/2012 0530   LDLCALC 121* 01/18/2012 0530   HgbA1C  No results found for this basename: HGBA1C    Urine Drug Screen:     Component Value Date/Time   LABOPIA POSITIVE* 01/18/2012 0519   COCAINSCRNUR NONE DETECTED 01/18/2012 0519   LABBENZ NONE DETECTED 01/18/2012 0519   AMPHETMU NONE DETECTED 01/18/2012 0519   THCU POSITIVE* 01/18/2012 0519   LABBARB NONE DETECTED 01/18/2012 0519    Alcohol Level:  Lab 01/17/12 1206  ETH <11    Dg Chest 2 View 01/17/2012 No acute chest finding.     Ct Head Wo Contrast 01/17/2012 Subacute right PCA territory infarct.   Mr Brain Wo Contrast 01/18/2012   IMPRESSION: Confluent acute right PCA infarct.  Edema without hemorrhage or significant mass effect. Concurrent tiny small acute infarct in the left cerebellum.Right PCA is occluded just beyond its  origin with no reconstituted flow. 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 Right vertebral artery terminates in PICA.   Original Report Authenticated By: Harley Hallmark, M.D.    Mr Maxine Glenn Head/brain Wo Cm 01/18/2012    Right PCA is occluded just beyond its origin with no reconstituted flow. 2.  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 3.  Probable small infundibula at the left ophthalmic artery origin and right supraclinoid ICA. 4.  Right vertebral artery terminates in PICA.    TEE  Carotid Doppler    EKG   normal sinus rhythm.   Therapy Recommendations PT - ; OT - ; ST -   Physical Exam pleasant middle aged Caucasian male not in distress.Awake alert. Afebrile. Head is nontraumatic. Neck is supple without bruit. Hearing is normal. Cardiac exam no murmur or gallop. Lungs are clear to auscultation. Distal pulses are well felt.  Neurological Exam ; Awake  Alert oriented x 3. Normal speech and language. Intact attention, registration and recall.Animal naming 11. eye movements full without nystagmus.Fundi not visualized. Visual acuity adequate. Dense left homonymous hemianopia. Face symmetric. Tongue midline. Normal strength, tone, reflexes and coordination. Normal sensation. Gait deferred.  ASSESSMENT Mr. Maston Wight is a 61 y.o. male presenting with acute delirium and ataxia . Imaging confirms an acute Right PCA infarct as well as a concurent small acute infarct in the left cerebellum. Felt to be embolic.  Work up underway. On none antiplatelets prior to admission. Now on aspirin 81 mg orally every day for secondary stroke prevention. Patient with resultant ataxia.   Acute Right Posterior Cerebral Artery Infarct  Acute Left cerebellar infarct  Hypertension  Alcohol Abuse  Chronic Back Pain  Hospital day # 1  TREATMENT/PLAN  Change aspirin to clopidogrel 75 mg orally every day  for secondary stroke prevention. TEE to look for embolic source.I have arranged TEE. Will need to be NPO after midnight. If positive for PFO (patent foramen ovale), check bilateral lower extremity venous dopplers to rule out DVT as possible source of stroke.   Aggressive risk factor management: goals---SBP < 130, HGB A1C < 7.0, LDL < 70 (diabetics) < 100 (non diabetics) PT/OT EVAL Carotid doppler pending.  Guy Franco, [facility],  MBA, MHA [facility] Pager: [phone] 01/18/2012 12:02 PM  Scribe for Dr. Delia Heady, Stroke Center Medical Director. He has personally reviewed chart, pertinent data, examined the patient and developed the plan of care. Pager:  [phone]

## 2012-01-18 NOTE — Progress Notes (Signed)
*  PRELIMINARY RESULTS* Vascular Ultrasound Carotid Duplex (Doppler) has been completed.   No obvious evidence of hemodynamically significant internal carotid artery stenosis bilaterally. Vertebral arteries are patent with antegrade flow. 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  01/18/2012 12:22 PM Gertie Fey, RDMS, RDCS

## 2012-01-19 ENCOUNTER — Encounter (HOSPITAL_COMMUNITY)
Admission: EM | Disposition: A | Discharge status: Home / Self Care | Payer: Self-pay | Source: Home / Self Care | Attending: Internal Medicine

## 2012-01-19 ENCOUNTER — Encounter (HOSPITAL_COMMUNITY): Payer: Self-pay | Admitting: *Deleted

## 2012-01-19 DIAGNOSIS — I509 Heart failure, unspecified: Secondary | ICD-10-CM

## 2012-01-19 DIAGNOSIS — I7090 Unspecified atherosclerosis: Secondary | ICD-10-CM | POA: Diagnosis present

## 2012-01-19 DIAGNOSIS — I6789 Other cerebrovascular disease: Secondary | ICD-10-CM

## 2012-01-19 HISTORY — DX: Heart failure, unspecified: I50.9

## 2012-01-19 HISTORY — PX: TEE WITHOUT CARDIOVERSION: SHX5443

## 2012-01-19 LAB — CBC WITH DIFFERENTIAL/PLATELET
Eosinophils Absolute: 0.3 10*3/uL (ref 0.0–0.7)
Lymphocytes Relative: 26 % (ref 12–46)
Lymphs Abs: 2.8 10*3/uL (ref 0.7–4.0)
MCH: 30.6 pg (ref 26.0–34.0)
Neutrophils Relative %: 62 % (ref 43–77)
Platelets: 300 10*3/uL (ref 150–400)
RBC: 4.45 MIL/uL (ref 4.22–5.81)
WBC: 10.6 10*3/uL — ABNORMAL HIGH (ref 4.0–10.5)

## 2012-01-19 LAB — BASIC METABOLIC PANEL
GFR calc non Af Amer: 89 mL/min — ABNORMAL LOW (ref >90)
Glucose, Bld: 102 mg/dL — ABNORMAL HIGH (ref 70–99)
Potassium: 3.8 mEq/L (ref 3.5–5.1)
Sodium: 137 mEq/L (ref 135–145)

## 2012-01-19 SURGERY — ECHOCARDIOGRAM, TRANSESOPHAGEAL
Anesthesia: Moderate Sedation

## 2012-01-19 MED ORDER — FENTANYL CITRATE 0.05 MG/ML IJ SOLN
INTRAMUSCULAR | Status: AC
  Filled 2012-01-19: qty 2

## 2012-01-19 MED ORDER — MIDAZOLAM HCL 5 MG/ML IJ SOLN
INTRAMUSCULAR | Status: AC
  Filled 2012-01-19: qty 2

## 2012-01-19 MED ORDER — MIDAZOLAM HCL 10 MG/2ML IJ SOLN
INTRAMUSCULAR | Status: DC | PRN
  Administered 2012-01-19: 1 mg via INTRAVENOUS
  Administered 2012-01-19 (×2): 2 mg via INTRAVENOUS

## 2012-01-19 MED ORDER — LISINOPRIL 2.5 MG PO TABS
2.5000 mg | ORAL_TABLET | Freq: Every day | ORAL | Status: DC
  Administered 2012-01-19 – 2012-01-20 (×2): 2.5 mg via ORAL
  Filled 2012-01-19 (×2): qty 1

## 2012-01-19 MED ORDER — FENTANYL CITRATE 0.05 MG/ML IJ SOLN
INTRAMUSCULAR | Status: DC | PRN
  Administered 2012-01-19 (×2): 25 ug via INTRAVENOUS

## 2012-01-19 MED ORDER — CARVEDILOL 3.125 MG PO TABS
3.1250 mg | ORAL_TABLET | Freq: Two times a day (BID) | ORAL | Status: DC
  Administered 2012-01-19 – 2012-01-20 (×3): 3.125 mg via ORAL
  Filled 2012-01-19 (×4): qty 1

## 2012-01-19 MED ORDER — SODIUM CHLORIDE 0.9 % IV SOLN
INTRAVENOUS | Status: DC
  Administered 2012-01-19: 10:00:00 via INTRAVENOUS

## 2012-01-19 MED ORDER — BUTAMBEN-TETRACAINE-BENZOCAINE 2-2-14 % EX AERO
INHALATION_SPRAY | CUTANEOUS | Status: DC | PRN
  Administered 2012-01-19: 2 via TOPICAL

## 2012-01-19 MED ORDER — DIPHENHYDRAMINE HCL 50 MG/ML IJ SOLN
INTRAMUSCULAR | Status: AC
  Filled 2012-01-19: qty 1
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
## 2012-01-19 NOTE — Progress Notes (Signed)
Medical Student Daily Progress Note  Subjective: Austin Andrade reports no new complaints today. His symptoms of disequilibrium and lack of balance are still improving, and he reports being much more steady when walking to and from the bathroom.  His close friend Austin Andrade [phone] visits with him today and reports that he will help take complete care of Austin Andrade when he returns home. Austin Andrade inquires about when he will be discharged so he can prepare Austin Andrade house and business affairs. He adds that they are trying to organize paperwork to get Austin Andrade appointed as Austin Andrade for Austin Andrade, who agrees this is the best plan.  Austin Andrade states he could afford his Plavix on an outpatient basis if it needed, though it is expensive. He is amenable to the healthcare plan of risk factor reduction and close follow-up.   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, normal field of vision in right eye; sensation diminished in left cheek and mandible, left UE and left LE compared to right; strength 5/5 in UE and LE b/l; RAM is intact in b/l UE; romberg test is negative; gait is wide-based and slow with no evidence of ataxia or disequilibrium, some shuffling that may be secondary to weakness Lab Results: Basic Metabolic Panel:  Lab 01/19/12 0981 01/18/12 0530  NA 137 138  K 3.8 4.0  CL 103 103  CO2 27 27  GLUCOSE 102* 93  BUN 8 10  CREATININE 0.91 0.92  CALCIUM 9.4 9.3  MG -- --  PHOS --  --   Liver Function Tests:  Lab 01/17/12 1206  AST 23  ALT 20  ALKPHOS 83  BILITOT 0.5  PROT 7.5  ALBUMIN 4.1   CBC:  Lab 01/19/12 0629 01/18/12 0530  WBC 10.6* 10.5  NEUTROABS 6.6 5.8  HGB 13.6 14.7  HCT 42.9 42.4  MCV 96.4 97.0  PLT 300 321   CBG:  Lab 01/17/12 1153  GLUCAP 119*   Hemoglobin A1C:  Lab 01/18/12 0530  HGBA1C 5.7*   Fasting Lipid Panel:  Lab 01/18/12 0530  CHOL 202*  HDL 33*  LDLCALC 121*  TRIG 238*  CHOLHDL 6.1  LDLDIRECT --   Thyroid Function Tests:  Lab 01/17/12 1847  TSH 1.642  T4TOTAL --  FREET4 --  T3FREE --  THYROIDAB --   Urine Drug Screen: Drugs of Abuse     Component Value Date/Time   LABOPIA POSITIVE* 01/18/2012 0519   COCAINSCRNUR NONE DETECTED 01/18/2012 0519   LABBENZ NONE DETECTED 01/18/2012 0519   AMPHETMU NONE DETECTED 01/18/2012 0519   THCU POSITIVE* 01/18/2012 0519   LABBARB NONE DETECTED 01/18/2012 0519    Alcohol Level:  Lab 01/17/12 1206  ETH <11   Urinalysis:  Lab 01/18/12 0519  COLORURINE YELLOW  LABSPEC 1.020  PHURINE 6.5  GLUCOSEU NEGATIVE  HGBUR NEGATIVE  BILIRUBINUR NEGATIVE  KETONESUR NEGATIVE  PROTEINUR NEGATIVE  UROBILINOGEN 1.0  NITRITE NEGATIVE  LEUKOCYTESUR NEGATIVE   Studies/Results: Dg Chest 2 View  01/17/2012  *RADIOLOGY REPORT*  Clinical Data: Stroke, hypertension  CHEST - 2 VIEW  Comparison: None.  Findings: Normal heart size and vascularity.  No focal pneumonia, collapse, consolidation, airspace disease, edema, effusion or pneumothorax.  Trachea is midline.  Healed posterior right rib fractures.  IMPRESSION: No acute chest finding.   Original Report Authenticated By: Judie Petit. Ruel Favors, M.D.    Mr Brain Wo Contrast  01/18/2012  *RADIOLOGY REPORT*  Clinical Data:  61 year old male with visual disturbance.  PCA stroke.  Comparison: Head CT without contrast 01/17/2012.  Cervical MRI 02/13/2011.  MRI HEAD WITHOUT CONTRAST  Technique: Multiplanar, multiecho pulse sequences of the brain and  surrounding structures were obtained according to standard protocol without intravenous contrast.  Findings: Confluent restricted diffusion throughout the right PCA territory involving the medial right occipital lobe, posterior right mesial temporal lobe and hippocampal complex, and with patchy involvement of the right thalamus.  Cytotoxic edema and T2 and FLAIR hyperintensity throughout the affected areas.  No petechial hemorrhage or significant mass effect.  There is also a punctate focus of restricted effusion in the left central cerebellum (series 4 image 6).  Subtle T2 and FLAIR hyperintensity here.  No other posterior fossa restricted diffusion.  No left supratentorial hemisphere diffusion abnormality.  Major intracranial vascular flow voids are preserved, MRA findings are below.  No ventriculomegaly. No acute intracranial hemorrhage identified. No midline shift or mass lesion.  Negative pituitary, cervicomedullary junction and visualized cervical spine.  Normal marrow signal.  Normal for age gray and white matter signal outside of the acute findings.  Orbit soft tissues and scalp soft tissues are within normal limits. Mild paranasal sinus mucosal thickening and/or occasional small retention cyst.  Mastoids are clear.  IMPRESSION: 1.  Confluent acute right PCA infarct.  Edema without hemorrhage or significant mass effect. 2. Concurrent tiny small acute infarct in the left cerebellum. 3.  See MRA findings below.  MRA HEAD WITHOUT CONTRAST  Technique: Angiographic images of the Circle of Willis were obtained using MRA technique without  intravenous contrast.  Findings: Antegrade flow in the posterior circulation.  The distal right vertebral artery appears patent but terminates in the right PICA.  The left vertebral artery supplies the basilar.  Minimal irregularity of the distal left vertebral artery without stenosis. Probable dominant left AICA.  No basilar stenosis.  SCA and left PCA origins are within normal  limits.  The basilar tip is patent, but the right PCA is occluded just beyond its origin (series 802 image 10).  No definite posterior communicating artery, a diminutive right anterior choroidal artery is visible.  No reconstituted PCA flow is identified.  Left PCA branches are within normal limits.  The left posterior communicating artery appears to be present.  Antegrade flow in both ICA siphons.  No ICA stenosis.  On the right, the ophthalmic artery origin is within normal limits.  A small inferior outpouching of the supraclinoid right ICA is suspected to be an infundibulum.  On the left there is a small 3 mm saccular lesion arising anteriorly from the proximal cavernous ICA (series 8 image 87).  An infundibulum is suspected at the left ophthalmic artery origin. The left posterior communicating artery origin is within normal limits.  Both carotid termini normal.  The MCA and ACA origins are normal. The anterior communicating artery is diminutive or absent. Visualized ACA branches are within normal limits.  Visualized bilateral MCA branches are within normal limits; simplified branching pattern on the right.  IMPRESSION: 1.  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 2.  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 3.  Probable small infundibula at the left ophthalmic artery origin and right supraclinoid ICA. 4.  Right vertebral artery terminates in PICA.   Original Report Authenticated By: Harley Hallmark, M.D.    Mr Mra Head/brain Wo Cm  01/18/2012  *RADIOLOGY REPORT*  Clinical Data:  61 year old male with visual disturbance.  PCA stroke.  Comparison: Head CT without contrast 01/17/2012.  Cervical MRI 02/13/2011.  MRI HEAD WITHOUT CONTRAST  Technique: Multiplanar, multiecho pulse sequences of the brain and surrounding structures were obtained according to standard protocol without intravenous contrast.  Findings: Confluent restricted diffusion throughout the right PCA  territory involving the medial right occipital lobe, posterior right mesial temporal lobe and hippocampal complex, and with patchy involvement of the right thalamus.  Cytotoxic edema and T2 and FLAIR hyperintensity throughout the affected areas.  No petechial hemorrhage or significant mass effect.  There is also a punctate focus of restricted effusion in the left central cerebellum (series 4 image 6).  Subtle T2 and FLAIR hyperintensity here.  No other posterior fossa restricted diffusion.  No left supratentorial hemisphere diffusion abnormality.  Major intracranial vascular flow voids are preserved, MRA findings are below.  No ventriculomegaly. No acute intracranial hemorrhage identified. No midline shift or mass lesion.  Negative pituitary, cervicomedullary junction and visualized cervical spine.  Normal marrow signal.  Normal for age gray and white matter signal outside of the acute findings.  Orbit soft tissues and scalp soft tissues are within normal limits. Mild paranasal sinus mucosal thickening and/or occasional small retention cyst.  Mastoids are clear.  IMPRESSION: 1.  Confluent acute right PCA infarct.  Edema without hemorrhage or significant mass effect. 2. Concurrent tiny small acute infarct in the left cerebellum. 3.  See MRA findings below.  MRA HEAD WITHOUT CONTRAST  Technique: Angiographic images of the Circle of Willis were obtained using MRA technique without  intravenous contrast.  Findings: Antegrade flow in the posterior circulation.  The distal right vertebral artery appears patent but terminates in the right PICA.  The left vertebral artery supplies the basilar.  Minimal irregularity of the distal left vertebral artery without stenosis. Probable dominant left AICA.  No basilar stenosis.  SCA and left PCA origins are within normal limits.  The basilar tip is patent, but the right PCA is occluded just beyond its origin (series 802 image 10).  No definite posterior communicating artery, a  diminutive right anterior choroidal artery is visible.  No reconstituted PCA flow is identified.  Left PCA branches are within normal limits.  The left posterior communicating artery appears to be present.  Antegrade flow in both ICA siphons.  No ICA stenosis.  On the right, the ophthalmic artery origin is within normal limits.  A small inferior outpouching of the supraclinoid right ICA is suspected to be an infundibulum.  On the left there is a small 3 mm saccular lesion arising anteriorly from the proximal cavernous ICA (series 8 image 87).  An infundibulum is suspected at the left ophthalmic artery origin. The left posterior communicating artery origin is within normal limits.  Both carotid termini normal.  The MCA and ACA origins are normal. The anterior communicating artery is diminutive or absent. Visualized ACA branches are within normal limits.  Visualized bilateral MCA branches are within  normal limits; simplified branching pattern on the right.  IMPRESSION: 1.  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 2.  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 3.  Probable small infundibula at the left ophthalmic artery origin and right supraclinoid ICA. 4.  Right vertebral artery terminates in PICA.   Original Report Authenticated By: Harley Hallmark, M.D.    Medications: I have reviewed the patient's current medications. Scheduled Meds:   . clopidogrel  75 mg Oral Q breakfast  . cyclobenzaprine  10 mg Oral q morning - 10a  . folic acid  1 mg Oral Daily  . gabapentin  300 mg Oral QHS  . gabapentin  600 mg Oral BID WC  . heparin  5,000 Units Subcutaneous Q8H  . multivitamin with minerals  1 tablet Oral Daily  . simvastatin  20 mg Oral q1800  . thiamine  100 mg Oral Daily   Or  . thiamine  100 mg Intravenous Daily   Continuous Infusions:   . sodium chloride 1,000 mL (01/18/12 2024)  . sodium chloride 20 mL/hr at 01/19/12 0945   PRN Meds:.acetaminophen,  acetaminophen, HYDROcodone-acetaminophen, LORazepam, LORazepam, ondansetron (ZOFRAN) IV, senna-docusate, DISCONTD: butamben-tetracaine-benzocaine, DISCONTD: fentaNYL, DISCONTD: midazolam   Assessment/Plan:  Problem List: **Subacute ischemic multifocal posterior circulation stroke** Hypertension   **Subacute ischemic multifocal posterior circulation stroke** Symptomatically, Austin Andrade is improving steadily in his balance and left eye visual field. Both TTE and TEE are negative for any intracardiac thrombi, PFO, and valvular vegetations, which makes a cardiac source of emboli less likely. The differential for his ischemic bihemispheric stroke therefore consists of cryptogenic embolic etiology versus less likely thrombotic etiology. His preceding episodes of diplopia seem most consistent with TIA's at this point, given that his carotid duplex was negative for occlusive disease. Therapeutic goals include continuing to reduce his risk factors of hypertension and dyslipidemia, and to anticoagulate him. He is outside of the 48 hour window of permissive hypertension that allows for maximal perfusion of the ischemic tissue, so it is reasonable to begin treating his hypertension slowly. 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 The best option for anticoagulation in his case is Clopidogrel for secondary stroke prevention, per neurology recommndations.  --Continue Simvastatin 20 mg (corrected from pravastatin 40 mg from yesterday's note) --Started Carvedilol 3.125 mg and 2.5 mg Lisinopril for blood pressure control --Started Clopidogrel 75 mg --Continue PT/OT visits; ambulation as tolerated  **Hypertension** Following the 48 hour window of permissive hypertension following his CVA, it is now acceptable to reduce his blood pressure  slowly to a goal of < 130/90. An ACE inhibitor and Beta blocker would be ideal agents to start, given his TTE findings of systolic and diastolic dysfunction (EF of 35-40%), as these reduce mortality in such patients. This combination has potential to cause bradycardia and hypotension, so he will be monitored closely and started at low doses. --Patient counseled on diet and physical activity --Started Carvedilol 3.125 mg and Lisinopril 2.5 mg as above --Will titrate up after one week and make recommendations to follow-up on an outpatient basis for further adjustment to a goal of under 130/90   This is a Psychologist, occupational Note.  The care of the patient was discussed with Dr. Shirlee Latch and the assessment and plan formulated with their assistance.  Please see their attached note for official  documentation of the daily encounter.  Diontae Route Imad 01/19/2012, 2:45 PM
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
## 2012-01-19 NOTE — Progress Notes (Signed)
This is the note from the medical student from 01/18/12. Overall agree with plan.  Did not decide to start antihypertensives ACEI, Coreg until 01/19/12 because during an acute stroke permissive hypertension x 48 hours from 01/17/12.  Also he is on Zocor instead of pravachol for hyperlipidemia.   Shirlee Latch MD

## 2012-01-19 NOTE — CV Procedure (Signed)
Procedure: TEE  Indication: CVA  Sedation: Versed 5 mg IV, Fentanyl 50 mcg IV  Findings: Please see echo section of chart for full report.  Normal LV size with mild LV hypertrophy.  EF 35-40% with global hypokinesis.  Mild aortic insufficiency.  No LAA thrombus.  No evidence for PFO.  Minimal aortic plaque.  No source of embolus.   No complications.   Should have cardiology followup as an outpatient given cardiomyopathy.  Ought to be on ACEi and beta blocker.   Austin Andrade 01/19/2012 10:48 AM

## 2012-01-19 NOTE — Progress Notes (Signed)
  Echocardiogram Echocardiogram Transesophageal has been performed.  Austin Andrade 01/19/2012, 11:21 AM

## 2012-01-19 NOTE — Clinical Social Work Psychosocial (Signed)
     Clinical Social Work Department BRIEF PSYCHOSOCIAL ASSESSMENT 01/19/2012  Patient:  Austin Andrade, Austin Andrade     Account Number:  [id number]     Admit date:  01/17/2012  Clinical Social Worker:  Jacelyn Grip  Date/Time:  01/19/2012 04:20 PM  Referred by:  Physician  Date Referred:  01/19/2012 Referred for  Other - See comment  Advanced Directives   Other Referral:   Assistance with Insurance   Interview type:  Patient Other interview type:    PSYCHOSOCIAL DATA Living Status:  ALONE Admitted from facility:   Level of care:   Primary support name:  Trey Paula Eells/friend Primary support relationship to patient:  FRIEND Degree of support available:   unknown at this time    CURRENT CONCERNS Current Concerns  Other - See comment   Other Concerns:   Advanced Directives and assistance with insurance    SOCIAL WORK ASSESSMENT / PLAN CSW received referral for Advanced Directives and assistance with insurance.    CSW contacted financial counseling to notify that pt is self pay. Per Artist, financial counseling spoke with pt today but pt is not appropriate for Medicaid and/or disability.    CSW met with pt at bedside to address pt concerns. CSW discussed Advanced Directives packet and provided packet to patient. CSW encouraged pt to notify MD or RN if he completes packet in order for notary to be arranged. CSW discussed with pt and provided information about affordable care act and the website for pt to visit to explore insurance options. Pt did not identify any other social work needs at this time.    CSW notified RNCM that pt will need medication assistance and per PT, Outpatient PT.    CSW signing off. Please re-consult if pt seeks for Advanced Directives to be notarized or if any further social work needs arise.   Assessment/plan status:  No Further Intervention Required Other assessment/ plan:   Information/referral to community resources:   Advanced Directives  and Information on affordable care act and website to explore enrollment options.    PATIENTS/FAMILYS RESPONSE TO PLAN OF CARE: Pt alert and oriented. Pt expressed appreciation for CSW visit and assistance. Pt stated understanding on how to notify staff if needing follow up in regard to notarizing advanced directives.

## 2012-01-19 NOTE — Evaluation (Signed)
Speech Language Pathology Evaluation Patient Details Name: Austin Andrade MRN: [id number] DOB: 1951/02/10 Today's Date: 01/19/2012 Time: 6962-9528 SLP Time Calculation (min): 25 min  Problem List:  Patient Active Problem List  Diagnosis  . Hypertension  . Left hemianopsia  . Sensory deficit, left  . Chronic neck pain  . H/O alcohol abuse  . Acute ischemic multifocal posterior circulation stroke  . Dyslipidemia  . Stenosis, cervical spine  . Cervical spondylosis  . Cervical arthritis  . 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  . Diastolic dysfunction (grade 1)   . Cerebellar infarct (acute, left)   . Artery occlusion, right PCA  . Chronic combined systolic and diastolic heart failure   Past Medical History:  Past Medical History  Diagnosis Date  . Alcohol abuse   . Hypertension     not currently on medication as of 01/17/2012   . Chronic back pain greater than 3 months duration   . Chronic neck pain   . GERD (gastroesophageal reflux disease)   . Stroke 01/17/2012    Subacute Right PCA territory ischemic infarction  . Arthritis     "neck" (01/17/2012)  . Gout   . Tinnitus of both ears     chronic, intermittent (01/17/2012)  . Double vision     "off and on; both eyes; last 10 days or more" 910/04/2011)  . Fall at home 01/16/2012    "was standing; stumbled backwards" (01/17/2012)  . Skin cancer     "both arms and hands" (01/17/2012)   Past Surgical History:  Past Surgical History  Procedure Date  . Cardiac catheterization ~ 2003    "in [place]"  . Knee arthroscopy twice    right  . Skin cancer excision     "both arms and hands" (01/17/2012)   HPI:      Assessment / Plan / Recommendation Clinical Impression  Pt scored 24/30 points on Montreal Cognitive Assessment (MoCA) (N=>25/30). Difficulty noted with serial 7 subtraction task, and delayed recall of 5 unrelated words. Pt is aware of memory deficits, and reports using strategies to compensate.  Provided option of  outpatient therapy if memory issues progress, or begin to impair independence or safety.    SLP Assessment  Patient does not need any further Speech Lanaguage Pathology Services    Follow Up Recommendations   Outpatient services available if needed in the future       Pertinent Vitals/Pain Neck pain   SLP Evaluation Prior Functioning  Cognitive/Linguistic Baseline: Within functional limits Education: 2 years of college   Cognition  Overall Cognitive Status: Appears within functional limits for tasks assessed Arousal/Alertness: Awake/alert Orientation Level: Oriented X4 Memory: Impaired Memory Impairment: Decreased recall of new information;Decreased short term memory;Other (comment) (Pt reports difficulty with memory.) Decreased Short Term Memory: Verbal basic Awareness: Appears intact Problem Solving: Appears intact Safety/Judgment: Appears intact Comments: MoCA administered. Pt scored 24/30 (N=>25/30). Encouraged use of compensatory strategies for improving recall. Provided information re: outpatient services in the future if the need arises.    Comprehension  Auditory Comprehension Overall Auditory Comprehension: Appears within functional limits for tasks assessed Visual Recognition/Discrimination Discrimination: Within Function Limits Reading Comprehension Reading Status: Within funtional limits    Expression Expression Primary Mode of Expression: Verbal Verbal Expression Overall Verbal Expression: Appears within functional limits for tasks assessed Written Expression Written Expression: Not tested   Oral / Motor Oral Motor/Sensory Function Overall Oral Motor/Sensory Function: Appears within functional limits for tasks assessed Motor Speech Overall Motor Speech: Appears within functional  limits for tasks assessed   GO  Austin Andrade B. Roots, [facility], CCC-SLP [phone]   [name] 01/19/2012, 3:34 PM

## 2012-01-19 NOTE — Progress Notes (Signed)
Patient ID: Austin Andrade, male   DOB: [birth year], 61 y.o.   MRN: [id number] Patient gone for TEE during rounds hence not examined. Chart reviewed. Continue present treatment. Will see patient in am.

## 2012-01-19 NOTE — Progress Notes (Signed)
OT Cancellation Note  Treatment cancelled today due to patient receiving procedure or test.  Adler Alton, Ursula Alert M 01/19/2012, 10:21 PM

## 2012-01-19 NOTE — Progress Notes (Signed)
Physical Therapy Treatment Patient Details Name: Austin Andrade MRN: [id number] DOB: 05/01/1950 Today's Date: 01/19/2012 Time: 4098-1191 PT Time Calculation (min): 13 min  PT Assessment / Plan / Recommendation Comments on Treatment Session  Patient progressing well. Still some slight gait imbalances with dynamic acitivities. Session cut short by transportors coming to take  patient to TEE    Follow Up Recommendations  Outpatient PT    Barriers to Discharge        Equipment Recommendations  None recommended by PT;None recommended by OT    Recommendations for Other Services    Frequency Min 4X/week   Plan Discharge plan remains appropriate;Frequency remains appropriate    Precautions / Restrictions Precautions Precautions: None;Other (comment) (L visual field deficit)   Pertinent Vitals/Pain     Mobility  Bed Mobility Bed Mobility: Sit to Supine Supine to Sit: 7: Independent Sitting - Scoot to Edge of Bed: 7: Independent Sit to Supine: 7: Independent Transfers Sit to Stand: 6: Modified independent (Device/Increase time) Stand to Sit: 6: Modified independent (Device/Increase time) Ambulation/Gait Ambulation/Gait Assistance: 5: Supervision Ambulation Distance (Feet): 1300 Feet Assistive device: None Ambulation/Gait Assistance Details: Patient able to ambulate with good balance this session. Was able to perform DGI activities ( see below). Patient did complain of "blinds spots" out of L eye Gait Pattern: Wide base of support Modified Rankin (Stroke Patients Only) Pre-Morbid Rankin Score: No symptoms Modified Rankin: Moderate disability    Exercises     PT Diagnosis:    PT Problem List:   PT Treatment Interventions:     PT Goals Acute Rehab PT Goals PT Goal: Sit to Stand - Progress: Progressing toward goal PT Goal: Ambulate - Progress: Progressing toward goal PT Goal: Up/Down Stairs - Progress: Progressing toward goal  Visit Information  Last PT Received On:  01/19/12 Assistance Needed: +1    Subjective Data      Cognition  Overall Cognitive Status: Appears within functional limits for tasks assessed/performed Arousal/Alertness: Awake/alert Orientation Level: Appears intact for tasks assessed Behavior During Session: [facility] for tasks performed    Balance  Standardized Balance Assessment Standardized Balance Assessment: Dynamic Gait Index Dynamic Gait Index Level Surface: Normal Change in Gait Speed: Normal Gait with Horizontal Head Turns: Mild Impairment Gait with Vertical Head Turns: Mild Impairment Gait and Pivot Turn: Normal Step Over Obstacle: Normal Step Around Obstacles: Mild Impairment Steps: Mild Impairment Total Score: 20   End of Session PT - End of Session Equipment Utilized During Treatment: Gait belt Activity Tolerance: Patient tolerated treatment well Patient left: in bed;Other (comment) (transport here to take patient to TEE) Nurse Communication: Mobility status   GP     Fredrich Birks 01/19/2012, 9:38 AM 01/19/2012 Fredrich Birks PTA [phone] pager [phone] office

## 2012-01-19 NOTE — Progress Notes (Signed)
Pt sent down to  Endo for Tee via bed without tele monitor this am.  Pt remained NPO .  Ambulated with pt and gait fairly steady this am

## 2012-01-19 NOTE — Interval H&P Note (Signed)
History and Physical Interval Note:  01/19/2012 10:30 AM  Austin Andrade  has presented today for surgery, with the diagnosis of stroke   The various methods of treatment have been discussed with the patient and family. After consideration of risks, benefits and other options for treatment, the patient has consented to  Procedure(s) (LRB) with comments: TRANSESOPHAGEAL ECHOCARDIOGRAM (TEE) (N/A) as a surgical intervention .  The patient's history has been reviewed, patient examined, no change in status, stable for surgery.  I have reviewed the patient's chart and labs.  Questions were answered to the patient's satisfaction.     Nickolai Rinks [employer]

## 2012-01-20 ENCOUNTER — Encounter (HOSPITAL_COMMUNITY): Payer: Self-pay

## 2012-01-20 ENCOUNTER — Encounter (HOSPITAL_COMMUNITY): Payer: Self-pay | Admitting: Cardiology

## 2012-01-20 MED ORDER — LISINOPRIL 2.5 MG PO TABS: 2.5000 mg | ORAL_TABLET | Freq: Every day | ORAL | Status: DC

## 2012-01-20 MED ORDER — CARVEDILOL 3.125 MG PO TABS: 3.1250 mg | ORAL_TABLET | Freq: Two times a day (BID) | ORAL | Status: DC

## 2012-01-20 MED ORDER — PRAVASTATIN SODIUM 20 MG PO TABS: 20.0000 mg | ORAL_TABLET | Freq: Every day | ORAL | Status: DC

## 2012-01-20 MED ORDER — CLOPIDOGREL BISULFATE 75 MG PO TABS: 75.0000 mg | ORAL_TABLET | Freq: Every day | ORAL | Status: DC

## 2012-01-20 NOTE — Progress Notes (Signed)
Stroke Team Progress Note  HISTORY This is a 61 y/o RHWM who has been having visual changes for past several days and it got worse yesterday and had some balance and Co ordination issues. He did not seek medical help And upon questioning he mentioned because he does not have health insurance. He mentioned he had some speech impairment. But his main concern was word finding difficulty, disoriented and confused around the house. He comes in today and head CT indicates PCA territory ischemic stroke   He does not take ASA at home.  He denied chest pain, no sob, no abdominal pain, no nausea, no vomiting, no tinnitus, no hearing loss, no speech impariment ( resolved), ataxia, poor coordination, no tick bites, no dysphagia, positive for visual changes  SUBJECTIVE His spouse is at the bedside.  Overall he feels his condition is quickly resolving.  OBJECTIVE Most recent Vital Signs: Filed Vitals:   01/19/12 1800 01/19/12 2126 01/20/12 0219 01/20/12 0618  BP: 160/86 159/89 163/92 175/92  Pulse: 85 84 79 83  Temp: 98 F (36.7 C) 98.4 F (36.9 C) 98.3 F (36.8 C) 98.1 F (36.7 C)  TempSrc: Oral Oral Oral Oral  Resp: 18 20 20 20   Height:      Weight:      SpO2: 95% 94% 97% 98%   CBG (last 3)   Basename 01/17/12 1153  GLUCAP 119*   Intake/Output from previous day: 10/03 0701 - 10/04 0700 In: 480 [P.O.:240; I.V.:240] Out: -   IV Fluid Intake:      . sodium chloride 1,000 mL (01/18/12 2024)  . sodium chloride 20 mL/hr at 01/19/12 0945    MEDICATIONS     . carvedilol  3.125 mg Oral BID WC  . clopidogrel  75 mg Oral Q breakfast  . cyclobenzaprine  10 mg Oral q morning - 10a  . folic acid  1 mg Oral Daily  . gabapentin  300 mg Oral QHS  . gabapentin  600 mg Oral BID WC  . heparin  5,000 Units Subcutaneous Q8H  . lisinopril  2.5 mg Oral Daily  . multivitamin with minerals  1 tablet Oral Daily  . simvastatin  20 mg Oral q1800  . thiamine  100 mg Oral Daily   Or  . thiamine  100  mg Intravenous Daily   PRN:  acetaminophen, acetaminophen, HYDROcodone-acetaminophen, LORazepam, LORazepam, ondansetron (ZOFRAN) IV, senna-docusate, DISCONTD: butamben-tetracaine-benzocaine, DISCONTD: fentaNYL, DISCONTD: midazolam  Diet:  Cardiac thin liquids Activity:  Up with assistance DVT Prophylaxis:  SCD  CLINICALLY SIGNIFICANT STUDIES Basic Metabolic Panel:   Lab 01/19/12 0629 01/18/12 0530  NA 137 138  K 3.8 4.0  CL 103 103  CO2 27 27  GLUCOSE 102* 93  BUN 8 10  CREATININE 0.91 0.92  CALCIUM 9.4 9.3  MG -- --  PHOS -- --   Liver Function Tests:   Lab 01/17/12 1206  AST 23  ALT 20  ALKPHOS 83  BILITOT 0.5  PROT 7.5  ALBUMIN 4.1   CBC:   Lab 01/19/12 0629 01/18/12 0530  WBC 10.6* 10.5  NEUTROABS 6.6 5.8  HGB 13.6 14.7  HCT 42.9 42.4  MCV 96.4 97.0  PLT 300 321   Urinalysis:   Lab 01/18/12 0519  COLORURINE YELLOW  LABSPEC 1.020  PHURINE 6.5  GLUCOSEU NEGATIVE  HGBUR NEGATIVE  BILIRUBINUR NEGATIVE  KETONESUR NEGATIVE  PROTEINUR NEGATIVE  UROBILINOGEN 1.0  NITRITE NEGATIVE  LEUKOCYTESUR NEGATIVE   Lipid Panel    Component Value Date/Time  CHOL 202* 01/18/2012 0530   TRIG 238* 01/18/2012 0530   HDL 33* 01/18/2012 0530   CHOLHDL 6.1 01/18/2012 0530   VLDL 48* 01/18/2012 0530   LDLCALC 121* 01/18/2012 0530   HgbA1C  Lab Results  Component Value Date   HGBA1C 5.7* 01/18/2012    Urine Drug Screen:     Component Value Date/Time   LABOPIA POSITIVE* 01/18/2012 0519   COCAINSCRNUR NONE DETECTED 01/18/2012 0519   LABBENZ NONE DETECTED 01/18/2012 0519   AMPHETMU NONE DETECTED 01/18/2012 0519   THCU POSITIVE* 01/18/2012 0519   LABBARB NONE DETECTED 01/18/2012 0519    Alcohol Level:   Lab 01/17/12 1206  ETH <11    Dg Chest 2 View 01/17/2012 No acute chest finding.     Ct Head Wo Contrast 01/17/2012 Subacute right PCA territory infarct.   Mr Brain Wo Contrast 01/18/2012   IMPRESSION: Confluent acute right PCA infarct.  Edema without hemorrhage or  significant mass effect. Concurrent tiny small acute infarct in the left cerebellum.Right PCA is occluded just beyond its origin with no reconstituted flow. 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 Right vertebral artery terminates in PICA.   Original Report Authenticated By: Harley Hallmark, M.D.    Mr Maxine Glenn Head/brain Wo Cm 01/18/2012    Right PCA is occluded just beyond its origin with no reconstituted flow. 2.  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 3.  Probable small infundibula at the left ophthalmic artery origin and right supraclinoid ICA. 4.  Right vertebral artery terminates in PICA.    TEE no source of embolus  Carotid Doppler  No obvious evidence of hemodynamically significant internal carotid artery stenosis bilaterally. Vertebral arteries are patent with antegrade flow. 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  EKG   normal sinus rhythm.   Therapy Recommendations PT &  OT - outpatient   Physical Exam pleasant middle aged Caucasian male not in distress.Awake alert. Afebrile. Head is nontraumatic. Neck is supple without bruit. Hearing is normal. Cardiac exam no murmur or gallop. Lungs are clear to auscultation. Distal pulses are well felt.  Neurological Exam ; Awake  Alert oriented x 3. Normal speech and language. Intact attention, registration and recall.Animal naming 11. eye movements full without nystagmus.Fundi not visualized. Visual acuity adequate. Dense left homonymous hemianopia. Face symmetric. Tongue midline. Normal strength, tone, reflexes and coordination. Normal sensation. Gait deferred.   ASSESSMENT Mr. Anubis Blewitt is a 61 y.o. male presenting with acute delirium and ataxia . Imaging confirms an acute Right PCA infarct as well as a concurent small acute infarct in the left cerebellum. Felt to be embolic.  No source found. Work up completed. On  no antiplatelets prior to admission. Now on clopidogrel 75 mg orally every day for secondary stroke prevention. Patient with resultant ataxia.   Acute Right Posterior Cerebral Artery Infarct  Acute Left cerebellar infarct  Hypertension  Alcohol Abuse  Chronic Back Pain  Hospital day # 3  TREATMENT/PLAN  Ccontinue clopidogrel 75 mg orally every day for secondary stroke prevention. Ongoing aggressive risk factor management: goals---SBP < 130, HGB A1C < 7.0, LDL < 70 (diabetics) < 100 (non diabetics) Home health PT/OT  Typically would do OP cardiac monitoring to look for Afib as well as OP TCD bubble study and emboli monitoring. Discussed with patient. Given lack of insurance coverage, will hold further testing at this time. Stroke Service will sign off.  Follow up with Dr. Pearlean Brownie, Stroke Clinic, in 2 months.   Annie Main, MSN, RN, ANVP-BC, ANP-BC, GNP-BC [facility] Pager: [phone] 01/20/2012 10:38 AM  Scribe for Dr. Delia Heady, Stroke Center Medical Director, who has personally reviewed chart, pertinent data, examined the patient and developed the plan of care. Pager:  [phone]

## 2012-01-20 NOTE — Progress Notes (Signed)
Occupational Therapy Treatment Patient Details Name: Austin Andrade MRN: [id number] DOB: 1950-09-03 Today's Date: 01/20/2012 Time: 4098-1191 OT Time Calculation (min): 32 min  OT Assessment / Plan / Recommendation Comments on Treatment Session Pt verbalizes good awareness of visual deficits.  Activities to facilitate visual scanning provided.  Recommend HHOT    Follow Up Recommendations  Outpatient OT    Barriers to Discharge       Equipment Recommendations  None recommended by OT    Recommendations for Other Services    Frequency Min 2X/week   Plan Discharge plan remains appropriate    Precautions / Restrictions Precautions Precautions: Other (comment) Precaution Comments: Lt. visual field deficit Restrictions Weight Bearing Restrictions: No   Pertinent Vitals/Pain     ADL  Transfers/Ambulation Related to ADLs: Pt. ambulates with supervision ADL Comments: Pt. just out of the shower - performed in standing.  Pt. with lt. superior field deficit; however, he demonstrates difficutly reading computer screen and has to tilt head to see the print - ? macular involvement.  Pt. is able to verbalize awareness of visual deficit and that it will impact work and driving - he reports he plans discontinuing both until vision is better.  Discussed visual scanning activities to perform at home, and pt. provided with a list of computer activities to improve/facilitate visual scanning and saccades.  Also instructed pt in community safety issues - i.e. making sure someone is with him, and walks on his Lt. side when out and about to avoid running into objects or people.  Pt. demonstrated mod difficulty locating email, and demonstrated difficulty with alternating and divided attention.  Pt. reports he plans to go to OP for therapies    OT Diagnosis:    OT Problem List:   OT Treatment Interventions:     OT Goals ADL Goals ADL Goal: Toilet Transfer - Progress: Met Miscellaneous OT Goals OT Goal:  Miscellaneous Goal #1 - Progress: Met OT Goal: Miscellaneous Goal #2 - Progress: Progressing toward goals  Visit Information  Last OT Received On: 01/20/12 Assistance Needed: +1    Subjective Data      Prior Functioning       Cognition  Overall Cognitive Status: Impaired Area of Impairment: Attention Arousal/Alertness: Awake/alert Orientation Level: Appears intact for tasks assessed Behavior During Session: [facility] for tasks performed Current Attention Level: Selective Attention - Other Comments: min - mod difficulty with alternating and divided attention Cognition - Other Comments: not specifically tested    Mobility  Shoulder Instructions Bed Mobility Supine to Sit: 7: Independent Sitting - Scoot to Edge of Bed: 7: Independent Sit to Supine: 7: Independent Transfers Transfers: Sit to Stand;Stand to Sit Sit to Stand: 6: Modified independent (Device/Increase time);From bed Stand to Sit: 6: Modified independent (Device/Increase time);To chair/3-in-1;With armrests       Exercises  Other Exercises Other Exercises: balance exercises: tandem stand at sink and single leg stand at sink multiple trials bil legs.   Other Exercises: braiding, heel walk, toe walk, tandem gait 25'x2 each in hallway.   supervision to minguard assist needed during higher level activities.    Balance Dynamic Gait Index Level Surface: Normal Change in Gait Speed: Normal Gait with Horizontal Head Turns: Normal Gait with Vertical Head Turns: Normal Gait and Pivot Turn: Normal Step Over Obstacle: Normal Step Around Obstacles: Normal Steps: Normal Total Score: 24    End of Session    GO     Corwin Kuiken M 01/20/2012, 3:25 PM

## 2012-01-20 NOTE — Progress Notes (Signed)
Medical Student Daily Progress Note  Problem List *Acute ischemic multifocal posterior circulation stroke* Chronic combined systolic and diastolic heart failure Hypertension Dyslipidemia  Subjective: Mr. Towell continues to improve clinically. His peripheral vision in his left eye in increased from prior, and he is ambulating well to and from the bathroom.  He is motivated to take better care of his health in the future, including close follow-up, adherence to his new medical regimen, and risk factor modification consisting of reducing his alcohol intake, exercising regularly, and eating a healthy diet. He understands the disease processes of heart failure and stroke and is amenable to the changes we have made regarding his work-up to date.   Objective: Vital signs in last 24 hours: Filed Vitals:   01/19/12 1800 01/19/12 2126 01/20/12 0219 01/20/12 0618  BP: 160/86 159/89 163/92 175/92  Pulse: 85 84 79 83  Temp: 98 F (36.7 C) 98.4 F (36.9 C) 98.3 F (36.8 C) 98.1 F (36.7 C)  TempSrc: Oral Oral Oral Oral  Resp: 18 20 20 20   Height:      Weight:      SpO2: 95% 94% 97% 98%   Weight change:   Intake/Output Summary (Last 24 hours) at 01/20/12 1029 Last data filed at 01/19/12 1830  Gross per 24 hour  Intake    480 ml  Output      0 ml  Net    480 ml   Physical Exam: General appearance: alert, cooperative, fatigued, but in no distress Lungs: clear to auscultation bilaterally with no wheezes, rales, or rhonchi in anterior lung fields Heart: regular rate and rhythm, faint S1 with normal S2, no murmur, rub or gallop Neurologic: Sensory: slightly diminished in left UE and LE and in left cheek and chin; Motor: grossly normal; Gait: slightly wide-based and slow, with no loss of balance or signs of ataxia  Lab Results: Basic Metabolic Panel:  Lab 01/19/12 1610 01/18/12 0530  NA 137 138  K 3.8 4.0  CL 103 103  CO2 27 27  GLUCOSE 102* 93  BUN 8 10  CREATININE 0.91 0.92    CALCIUM 9.4 9.3  MG -- --  PHOS -- --   Liver Function Tests:  Lab 01/17/12 1206  AST 23  ALT 20  ALKPHOS 83  BILITOT 0.5  PROT 7.5  ALBUMIN 4.1   CBC:  Lab 01/19/12 0629 01/18/12 0530  WBC 10.6* 10.5  NEUTROABS 6.6 5.8  HGB 13.6 14.7  HCT 42.9 42.4  MCV 96.4 97.0  PLT 300 321   CBG:  Lab 01/17/12 1153  GLUCAP 119*   Hemoglobin A1C:  Lab 01/18/12 0530  HGBA1C 5.7*   Fasting Lipid Panel:  Lab 01/18/12 0530  CHOL 202*  HDL 33*  LDLCALC 121*  TRIG 238*  CHOLHDL 6.1  LDLDIRECT --   Thyroid Function Tests:  Lab 01/17/12 1847  TSH 1.642  T4TOTAL --  FREET4 --  T3FREE --  THYROIDAB --   Urine Drug Screen: Drugs of Abuse     Component Value Date/Time   LABOPIA POSITIVE* 01/18/2012 0519   COCAINSCRNUR NONE DETECTED 01/18/2012 0519   LABBENZ NONE DETECTED 01/18/2012 0519   AMPHETMU NONE DETECTED 01/18/2012 0519   THCU POSITIVE* 01/18/2012 0519   LABBARB NONE DETECTED 01/18/2012 0519    Alcohol Level:  Lab 01/17/12 1206  ETH <11   Urinalysis:  Lab 01/18/12 0519  COLORURINE YELLOW  LABSPEC 1.020  PHURINE 6.5  GLUCOSEU NEGATIVE  HGBUR NEGATIVE  BILIRUBINUR NEGATIVE  KETONESUR  NEGATIVE  PROTEINUR NEGATIVE  UROBILINOGEN 1.0  NITRITE NEGATIVE  LEUKOCYTESUR NEGATIVE    MRI/MRA Brain w/o Contrast  IMPRESSION: 1. 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 2. 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 3. Probable small infundibula at the left ophthalmic artery origin and right supraclinoid ICA. 4. Right vertebral artery terminates in PICA.  TEE - Left ventricle: The cavity size was normal. Wall thickness was normal. Systolic function was moderately reduced. The estimated ejection fraction was in the range of 35% to 40%. Diffuse hypokinesis. - Aortic valve: There was no stenosis. Mild regurgitation. - Aorta: Normal caliber aorta with mild plaque. - Left atrium: The atrium was mildly dilated. No evidence  of thrombus in the atrial cavity or appendage. No evidence of thrombus in the atrial cavity or appendage. - Right ventricle: The cavity size was normal. Systolic function was normal. - Right atrium: No evidence of thrombus in the atrial cavity or appendage. - Atrial septum: No defect or patent foramen ovale was identified. Echo contrast study showed no right-to-left atrial level shunt, at baseline or with provocation.   Medications: I have reviewed the patient's current medications. Scheduled Meds:   . carvedilol  3.125 mg Oral BID WC  . clopidogrel  75 mg Oral Q breakfast  . cyclobenzaprine  10 mg Oral q morning - 10a  . folic acid  1 mg Oral Daily  . gabapentin  300 mg Oral QHS  . gabapentin  600 mg Oral BID WC  . heparin  5,000 Units Subcutaneous Q8H  . lisinopril  2.5 mg Oral Daily  . multivitamin with minerals  1 tablet Oral Daily  . simvastatin  20 mg Oral q1800  . thiamine  100 mg Oral Daily   Or  . thiamine  100 mg Intravenous Daily   Continuous Infusions:   . sodium chloride 1,000 mL (01/18/12 2024)  . sodium chloride 20 mL/hr at 01/19/12 0945   PRN Meds:.acetaminophen, acetaminophen, HYDROcodone-acetaminophen, LORazepam, LORazepam, ondansetron (ZOFRAN) IV, senna-docusate, DISCONTD: butamben-tetracaine-benzocaine, DISCONTD: fentaNYL, DISCONTD: midazolam   Assessment and Plan *Acute ischemic multifocal posterior circulation stroke* Taking into consideration findings from TTE, TEE, carotid doppler, and MRI/MRA, it seems an embolic source of Mr. Dibiasio stroke has not yet been found in terms of a thrombus, PFO, or significant carotid stenosis. His infarcts are bihemispheric in nature, meaning that two separate thrombotic events would have occurred in the right PCA and left cerebellar circulation for the etiology to be thrombotic, which is unlikely even in light of his atherosclerotic risk factors of HTN, dyslipidemia, and significant history of tobacco and alcohol abuse.  An exception would be a coagulation disorder esulting in a prothrombotic state, which is not suspected in Mr. Mcmann given his negative past medical history and family history of signs/symptoms of such. In addition, current literature shows that 75% of ischemic strokes have an embolic etiology. The differential includes atheroembolic versus cryptogenic embolic ischemic stroke. His carotid doppler study showed no significant flow limiting stenosis, but mentioned an irregular and possibly ulcerative heterogenous plaque in his right ICA, which could be the source of an atheroembolus that would infarct his right PCA downstream. However, this still would not account for the infarct in his left cerebellum, unless a piece of the atheroembolus broke off from the right ICA and went through the circle of Willis to end in the left cerebellar circulation. 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 The second item on the differential is cryptogenic embolic stroke, which is a diagnosis of exclusion that accounts for 30-40% of embolic strokes, and therefore must be considered if Mr. Andreski carotid disease was not the source of his stroke. In either case, it is important to stabilize the existing plaques with cholesterol control with a statin, give antiplatelet therapy in the form of Clopidogrel, and reduce risk factors of alcohol use, hypertension, and diet and exercise. --Continue Clopidogrel 75 mg --Switched from Simvastatin 20 to Pravastatin 40 because it is on the $4 list --Patient counseled on diet, exercise, limiting ethanol use, and medication regimen as a means of secondary stroke prevention  Chronic combined systolic and diastolic heart failure Findings from the TTE and  TEE of note include a reduced ejection fraction of 35-40%, increased contribution of atrial contraction to ventricular filling, and diffuse hypokinesis, which are consistent with chronic systolic and diastolic dysfunction. Diffuse hypokinesis likely indicates global ischemia secondary to ischemic heart disease versus cardiomyopathy. Given his carotid disease, it is likely that he has a component of coronary stenosis causing global demand ischemia that accounts for some systolic and diastolic dysfunction. The other component that may be implicated is a dilated cardiomyopathy, possibly due to long term alcohol abuse. Given that the echo found that ventricular wall thickness is not abnormal, and that the ventricular size is within normal limits, it is possible that these findings represent very early stage dilated cardiomyopathy, resulting in combined systolic and diastolic dysfunction as well, with overlay from ischemic heart disease from atherosclerosis. In either case, since he has a decreased EF, therapeutic options should include adding medications that have been shown to reduce mortality in systolic CHF patients, including a beta blocker and ACE inhibitor. In addition, these two medication classes would reduce the important risk factor of hypertension. Because he experienced a CVA recently, it is most prudent to titrate these medicines slowly, over several weeks, and ideally with a close follow-up on an outpatient basis. --Continue Carvedilol 3.125 mg and Lisinopril 2.5 mg for mortality reduction --Titrate doses up after 2 weeks to a BP of <140/90 in [facility], where he will follow up --Patient advised on basics of CHF, including advice not to take NSAID's, as they can exacerbate dysfunction   Hypertension High blood pressures remain a significant risk factor for further stroke, MI, and accelerated atherosclerosis for Mr. Kirshenbaum. Therapy options include ACE inhibitors, beta blockers, calcium channel  blockers, and nitrates and hydralazine. Mr. Goulette blood pressures do not need emergent, aggressive management with nitrates and hydralazine, and he would not benefit from a mortality reduction if CCBs were used. Therefore, of these agents, an ACE inhibitor would be the ideal therapy. Blood pressures have been trending down since initiation of lisinopril, but further titration can be done an an outpatient basis, as above. --Continue Lisinopril 2.5 mg --Titrate doses up after 2 weeks in follow-up in [facility], as above   Dyslipidemia Not well controlled previously, but patient has been started on a statin. --Changed Simvastatin 20 to Pravastatin 40, as above --Diet and exercise recommendations as above    This is a Psychologist, occupational Note.  The care of the patient was discussed with Dr. Shirlee Latch and the assessment and plan formulated with their assistance.  Please see their attached note for official  documentation of the daily encounter.  Canuto Kingston Imad 01/20/2012, 10:29 AM

## 2012-01-20 NOTE — Progress Notes (Signed)
[facility]  Patient Status: New  [facility] is providing the following services: PT and MSW  If patient discharges after hours, please call [phone].   Austin Andrade 01/20/2012, 2:17 PM
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
## 2012-01-20 NOTE — Progress Notes (Signed)
Patient ID: Austin Andrade, male   DOB: 06-Apr-1951, 61 y.o.   MRN: [id number]    Internal Medicine Teaching Program   Date of Admission:  01/17/2012     Principal Problem:  *Acute ischemic multifocal posterior circulation stroke Active Problems:  Left hemianopsia  Sensory deficit, left  Hypertension  Chronic neck pain  Dyslipidemia  Cerebellar infarct (acute, left)   Artery occlusion, right PCA  Chronic combined systolic and diastolic heart failure      . carvedilol  3.125 mg Oral BID WC  . clopidogrel  75 mg Oral Q breakfast  . cyclobenzaprine  10 mg Oral q morning - 10a  . folic acid  1 mg Oral Daily  . gabapentin  300 mg Oral QHS  . gabapentin  600 mg Oral BID WC  . heparin  5,000 Units Subcutaneous Q8H  . lisinopril  2.5 mg Oral Daily  . multivitamin with minerals  1 tablet Oral Daily  . simvastatin  20 mg Oral q1800  . thiamine  100 mg Oral Daily   Or  . thiamine  100 mg Intravenous Daily    Subjective: Austin Andrade is feeling better. He has not noted any change in his left temporal visual field cut but he has less of a feeling of "disequilibrium" when he is up walking.  Objective: Temp:  [98 F (36.7 C)-98.4 F (36.9 C)] 98.1 F (36.7 C) (10/04 0618) Pulse Rate:  [79-88] 83  (10/04 0618) Resp:  [18-20] 20  (10/04 0618) BP: (159-175)/(86-96) 175/92 mmHg (10/04 0618) SpO2:  [94 %-98 %] 98 % (10/04 0618)  General:  Alert and comfortable working on his lap top computer Skin: No rash Lungs: Clear Cor: Regular S1 and S2 no murmur Neuro: No change in left temporal visual field cut. Decrease sensation left cheek unchanged. Gait not tested today.  Lab Results Lab Results  Component Value Date   WBC 10.6* 01/19/2012   HGB 13.6 01/19/2012   HCT 42.9 01/19/2012   MCV 96.4 01/19/2012   PLT 300 01/19/2012    Lab Results  Component Value Date   CREATININE 0.91 01/19/2012   BUN 8 01/19/2012   NA 137 01/19/2012   K 3.8 01/19/2012   CL 103 01/19/2012   CO2 27 01/19/2012      Lab Results  Component Value Date   ALT 20 01/17/2012   AST 23 01/17/2012   ALKPHOS 83 01/17/2012   BILITOT 0.5 01/17/2012   Assessment: He suffered a bihemispheric ischemic strokes. His TEE did not reveal any cardiac source for emboli. I agree with antiplatelet therapy, control of his blood pressure and dyslipidemia. The TEE also showed some global hypokinesis which could be related to prior alcohol use, myocarditis or could be idiopathic. I am less inclined to think that he has ischemic cardiomyopathy. I agree with ACE inhibitor and beta blocker therapy. I've examined him and reviewed discharge plans with my team.  Plan: 1. Antiplatelet, antihypertensive and statin therapy 2. Outpatient neuro-rehabilitation therapy 3. Outpatient followup with primary care and cardiology  Austin Asters, MD Internal Medicine Teaching Program [facility] [phone] pager   [phone] cell 01/20/2012, 1:19 PM
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
## 2012-01-20 NOTE — Discharge Summary (Signed)
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 NEGATIVE  Bilirubin Urine Latest Range: NEGATIVE  NEGATIVE  Ketones, ur Latest Range: NEGATIVE mg/dL NEGATIVE  Protein Latest Range: NEGATIVE mg/dL NEGATIVE  Urobilinogen, UA Latest Range: 0.0-1.0 mg/dL 1.0  Nitrite Latest Range: NEGATIVE  NEGATIVE  Leukocytes, UA Latest Range: NEGATIVE  NEGATIVE  Hgb urine dipstick Latest Range: NEGATIVE  NEGATIVE   Results for JAHAAD, PENADO (MRN [id number]) as of 01/20/2012 10:46  Ref. Range 01/17/2012 12:06 01/18/2012 05:19  Alcohol, Ethyl (B) Latest Range: 0-11 mg/dL <14   AMPHETAMINES Latest Range: NONE DETECTED   NONE DETECTED  Barbiturates Latest Range: NONE DETECTED   NONE DETECTED  Benzodiazepines Latest Range: NONE DETECTED   NONE DETECTED  Opiates Latest Range: NONE DETECTED   POSITIVE (A)  COCAINE Latest Range: NONE DETECTED   NONE DETECTED  Tetrahydrocannabinol Latest Range: NONE DETECTED   POSITIVE (A)   Signed: Annett Gula 01/20/2012, 11:31 AM   Time Spent on Discharge: >30 minutes <1 hour

## 2012-01-21 NOTE — Progress Notes (Signed)
Agree with A&P student doctor Hamzi. Reviewed with him and also see my assessment and plan. 

## 2012-01-21 NOTE — ED Provider Notes (Signed)
History     CSN: [id number]  Arrival date & time 01/17/12  1116   First MD Initiated Contact with Patient 01/17/12 1230      Chief Complaint  Patient presents with  . Cerebrovascular Accident    (Consider location/radiation/quality/duration/timing/severity/associated sxs/prior treatment) Patient is a 61 y.o. male presenting with Acute Neurological Problem.  Cerebrovascular Accident  Austin Andrade is a 61 y.o. male presenting with double vision and difficult walking due to balance from his PCP office.  This started 1 week ago but spontaneously resolved, then occurred yesterday morning and has persisted.  He says he can still see but sees double, no "curtain" over his vision, colors, or hallucinations.  Pt says his difficulty with walking is being disoriented and "off balance." Problems are moderate, constant, no alleviating factors.  No associated sinus pain/pressure, recent URI, ear pain, tinnitus, doesn't take aspirin.  Drinks 3-4 beers daily. Lives by himself, self employed. He denies headache, chest pain, dyspnea, nausea or vomiting or abdominal pain.  Past Medical History  Diagnosis Date  . Alcohol abuse   . Hypertension     not currently on medication as of 01/17/2012   . Chronic back pain greater than 3 months duration   . Chronic neck pain   . GERD (gastroesophageal reflux disease)   . Stroke 01/17/2012    Subacute Right PCA territory ischemic infarction  . Arthritis     "neck" (01/17/2012)  . Gout   . Tinnitus of both ears     chronic, intermittent (01/17/2012)  . Double vision     "off and on; both eyes; last 10 days or more" 910/04/2011)  . Fall at home 01/16/2012    "was standing; stumbled backwards" (01/17/2012)  . Skin cancer     "both arms and hands" (01/17/2012)    Past Surgical History  Procedure Date  . Cardiac catheterization ~ 2003    "in [place]"  . Knee arthroscopy twice    right  . Skin cancer excision     "both arms and hands" (01/17/2012)  . Tee  without cardioversion 01/19/2012    Procedure: TRANSESOPHAGEAL ECHOCARDIOGRAM (TEE);  Surgeon: Laurey Morale, MD;  Location: [facility] ENDOSCOPY;  Service: Cardiovascular;  Laterality: N/A;    Family History  Problem Relation Age of Onset  . Emphysema Father     deceased  . Other Mother     blood disorder ?cancer, deceased  . Diabetes Father     borderline    History  Substance Use Topics  . Smoking status: Former Smoker -- 1.0 packs/day for 40 years    Types: Cigarettes  . Smokeless tobacco: Never Used   Comment: 01/17/2012 "quit smoking > 5 yr ago"  . Alcohol Use: 8.4 oz/week    14 Cans of beer per week     daily      Review of Systems At least 10pt or greater review of systems completed and are negative except where specified in the HPI.  Allergies  Review of patient's allergies indicates no known allergies.  Home Medications   Current Outpatient Rx  Name Route Sig Dispense Refill  . CYCLOBENZAPRINE HCL 10 MG PO TABS Oral Take 10 mg by mouth every morning.    Marland Kitchen GABAPENTIN 300 MG PO CAPS Oral Take 300-600 mg by mouth 3 (three) times daily. Take 600mg  in AM and 600mg  in PM and 300mg  at bedtime    . HYDROCODONE-ACETAMINOPHEN 5-325 MG PO TABS Oral Take 1-2 tablets by mouth every 6 (six) hours  as needed. For pain    . CARVEDILOL 3.125 MG PO TABS Oral Take 1 tablet (3.125 mg total) by mouth 2 (two) times daily with a meal. 60 tablet 3  . CLOPIDOGREL BISULFATE 75 MG PO TABS Oral Take 1 tablet (75 mg total) by mouth daily with breakfast. 30 tablet 3  . LISINOPRIL 2.5 MG PO TABS Oral Take 1 tablet (2.5 mg total) by mouth daily. 30 tablet 3  . PRAVASTATIN SODIUM 20 MG PO TABS Oral Take 1 tablet (20 mg total) by mouth daily. 30 tablet 3    BP 150/92  Pulse 107  Temp 98.4 F (36.9 C) (Oral)  Resp 18  Ht 5\' 8"  (1.727 m)  Wt 165 lb (74.844 kg)  BMI 25.09 kg/m2  SpO2 99%  Physical Exam  PHYSICAL EXAM: VITAL SIGNS:  . Filed Vitals:   01/20/12 0219 01/20/12 0618 01/20/12 1000  01/20/12 1400  BP: 163/92 175/92 185/101 150/92  Pulse: 79 83 91 107  Temp: 98.3 F (36.8 C) 98.1 F (36.7 C) 97.9 F (36.6 C) 98.4 F (36.9 C)  TempSrc: Oral Oral Oral Oral  Resp: 20 20 18 18   Height:      Weight:      SpO2: 97% 98% 99% 99%   CONSTITUTIONAL: Awake, oriented, appears non-toxic HENT: Atraumatic, normocephalic, oral mucosa pink and moist, airway patent. Nares patent without drainage. External ears normal. EYES: Conjunctiva clear, EOMI, PERRLA NECK: Trachea midline, non-tender, supple CARDIOVASCULAR: Normal heart rate, Normal rhythm, No murmurs, rubs, gallops PULMONARY/CHEST: Clear to auscultation, no rhonchi, wheezes, or rales. Symmetrical breath sounds. CHEST WALL: No lesions. Non-tender. ABDOMINAL: Non-distended, soft, non-tender - no rebound or guarding.  BS normal. NEUROLOGIC:Left temporal visual field compromise.  Facial sensation equal to light touch bilaterally.  Good muscle bulk in the masseter muscle and good lateral movement of the jaw.  Facial expressions equal and good strength with smile/frown and puffed cheeks.  Hearing grossly intact to finger rub test.  Uvula, tongue are midline with no deviation. Symmetrical palate elevation.  Trapezius and SCM muscles are 5/5 strength bilaterally.   DTR: Brachioradialis, biceps, patellar, Achilles tendon reflexes 2+ bilaterally.  No clonus. Strength: 5/5 strength flexors and extensors in the upper and lower extremities.  Grip strength, finger adduction/abduction 5/5. Sensation: Sensation intact distally to light touch, proprioception using position testing of 2nd digit and great toe Cerebellar: Pt unsafe to walk unassisted. Left arm dysmetria to finger-to-nose testing.  LLE dysmetria to heel to shin testing. LUE pronator drift. EXTREMITIES: No clubbing, cyanosis, or edema SKIN: Warm, Dry, No erythema, No rash   ED Course  Procedures (including critical care time)  Date: 01/21/2012  Rate: 93  Rhythm: normal sinus  rhythm  QRS Axis: normal  Intervals: normal  ST/T Wave abnormalities: normal  Conduction Disutrbances: none  Narrative Interpretation: unremarkable     Labs Reviewed  COMPREHENSIVE METABOLIC PANEL - Abnormal; Notable for the following:    Glucose, Bld 118 (*)     All other components within normal limits  CBC WITH DIFFERENTIAL - Abnormal; Notable for the following:    WBC 11.4 (*)     MCH 34.2 (*)     All other components within normal limits  GLUCOSE, CAPILLARY - Abnormal; Notable for the following:    Glucose-Capillary 119 (*)     All other components within normal limits  URINE RAPID DRUG SCREEN (HOSP PERFORMED) - Abnormal; Notable for the following:    Opiates POSITIVE (*)     Tetrahydrocannabinol POSITIVE (*)  All other components within normal limits  HEMOGLOBIN A1C - Abnormal; Notable for the following:    Hemoglobin A1C 5.7 (*)     Mean Plasma Glucose 117 (*)     All other components within normal limits  BASIC METABOLIC PANEL - Abnormal; Notable for the following:    GFR calc non Af Amer 89 (*)     All other components within normal limits  LIPID PANEL - Abnormal; Notable for the following:    Cholesterol 202 (*)     Triglycerides 238 (*)     HDL 33 (*)     VLDL 48 (*)     LDL Cholesterol 121 (*)     All other components within normal limits  CBC WITH DIFFERENTIAL - Abnormal; Notable for the following:    WBC 10.6 (*)     All other components within normal limits  BASIC METABOLIC PANEL - Abnormal; Notable for the following:    Glucose, Bld 102 (*)     GFR calc non Af Amer 89 (*)     All other components within normal limits  ETHANOL  URINALYSIS, ROUTINE W REFLEX MICROSCOPIC  TSH  CBC WITH DIFFERENTIAL  LAB REPORT - SCANNED   No results found.   1. Acute right PCA stroke   2. Cerebellar infarct   3. Acute ischemic multifocal posterior circulation stroke       MDM  Austin Andrade is a 61 y.o. male presenting with occipital/cerebellar CVA.  CT  shows subacute right PCA territory infarct - no hemorrhage. Pt not eligible for TPA therapy.  D/W neurology.  Will work pt up in ED, admit to medicine.  EKG and lab workup is unremarkable.  Pt stable and admitted.        Jones Skene, MD 01/21/12 [phone]

## 2012-01-21 NOTE — Progress Notes (Signed)
Agree with A&P student doctor Hamzi. Reviewed with him and also see my assessment and plan.

## 2012-01-26 ENCOUNTER — Encounter: Payer: Self-pay | Admitting: Internal Medicine

## 2012-01-26 ENCOUNTER — Ambulatory Visit (INDEPENDENT_AMBULATORY_CARE_PROVIDER_SITE_OTHER): Payer: 59 | Admitting: Internal Medicine

## 2012-01-26 VITALS — BP 148/90 | HR 100 | Temp 97.8°F | Ht 68.5 in | Wt 164.5 lb

## 2012-01-26 DIAGNOSIS — E785 Hyperlipidemia, unspecified: Secondary | ICD-10-CM

## 2012-01-26 DIAGNOSIS — I639 Cerebral infarction, unspecified: Secondary | ICD-10-CM

## 2012-01-26 DIAGNOSIS — I5042 Chronic combined systolic (congestive) and diastolic (congestive) heart failure: Secondary | ICD-10-CM

## 2012-01-26 DIAGNOSIS — I729 Aneurysm of unspecified site: Secondary | ICD-10-CM

## 2012-01-26 DIAGNOSIS — Z1211 Encounter for screening for malignant neoplasm of colon: Secondary | ICD-10-CM

## 2012-01-26 DIAGNOSIS — H53462 Homonymous bilateral field defects, left side: Secondary | ICD-10-CM

## 2012-01-26 DIAGNOSIS — Z0000 Encounter for general adult medical examination without abnormal findings: Secondary | ICD-10-CM | POA: Insufficient documentation

## 2012-01-26 DIAGNOSIS — H53469 Homonymous bilateral field defects, unspecified side: Secondary | ICD-10-CM

## 2012-01-26 DIAGNOSIS — I6350 Cerebral infarction due to unspecified occlusion or stenosis of unspecified cerebral artery: Secondary | ICD-10-CM

## 2012-01-26 DIAGNOSIS — I63539 Cerebral infarction due to unspecified occlusion or stenosis of unspecified posterior cerebral artery: Secondary | ICD-10-CM

## 2012-01-26 DIAGNOSIS — I10 Essential (primary) hypertension: Secondary | ICD-10-CM

## 2012-01-26 LAB — BASIC METABOLIC PANEL
CO2: 30 mEq/L (ref 19–32)
Chloride: 104 mEq/L (ref 96–112)
Sodium: 140 mEq/L (ref 135–145)

## 2012-01-26 MED ORDER — LISINOPRIL 5 MG PO TABS: 5.0000 mg | ORAL_TABLET | Freq: Every day | ORAL | Status: DC

## 2012-01-26 MED ORDER — CARVEDILOL 6.25 MG PO TABS: 6.2500 mg | ORAL_TABLET | Freq: Two times a day (BID) | ORAL | Status: DC

## 2012-01-26 NOTE — Patient Instructions (Signed)
Your Blood Pressure was elevated today.  Controlling your blood pressure will reduce your risk for a future stroke. -we are doubling your Carvedilol (Coreg) medication to 6.25 mg, twice per day.  We have sent in a new prescription to [facility] with the increased dose. -we are doubling your Lisinopril medication to 5 mg, once per day.  We have sent in a new prescription to [facility] with the increased dose.  For your vision difficulty following your stroke, we are referring you to an Ophthalmologist.  We are giving your Fecal Occult Blood Cards for colon cancer screening.  Follow the instructions listed on the card, and mail the card in the enclosed envelope.  Please return for a follow-up visit in 2-4 months.

## 2012-01-26 NOTE — Assessment & Plan Note (Signed)
-  continue pravastatin, as started during his last hospitalization

## 2012-01-26 NOTE — Progress Notes (Signed)
HPI The patient is a 62 y.o. yo male, presenting for a hospital follow-up for stroke.  The patient was recently hospitalized 10/1-10/4 with a first-episode acute stroke.  Since hospital discharge he continues to note some mild feelings of imbalance, though notes that they do not interfere with his ability to walk.  He also continues to note left-sided hemianopsia, which has been stable since discharge.  The patient previously worked self-employed at his own Teacher, [employer].  Since hospital discharge, he has not been able to resume working.  The patient has started home PT, and will be starting home OT.  When asked what his greatest barrier is to returning to work, he states "I'm not sure, I just don't feel confident".  The patient quit smoking 5 years ago with Chantix.  He drinks 2 drinks of alcohol per night.  His BP is high in clinic today, after starting lisinopril 2.5 and coreg 3.125 during his last hospitalization.  His LDL was found to be elevated during his last hospitalization, and he was started on pravastatin.  The patient has been taking plavix.  ROS: General: no fevers, chills, changes in weight, changes in appetite Skin: no rash HEENT: no hearing changes, sore throat Pulm: no dyspnea, coughing, wheezing CV: no chest pain, palpitations, shortness of breath Abd: no abdominal pain, nausea/vomiting, diarrhea/constipation GU: no dysuria, hematuria, polyuria Ext: no arthralgias, myalgias Neuro: no weakness, numbness, or tingling  Filed Vitals:   01/26/12 1356  BP: 148/90  Pulse: 100  Temp: 97.8 F (36.6 C)    PEX General: alert, cooperative, and in no apparent distress HEENT: pupils equal round and reactive to light, deficit noted in left temporal upper and lower visual fields of left eye, EOMI, oropharynx clear and non-erythematous Neck: supple, no lymphadenopathy Lungs: clear to ascultation bilaterally, normal work of respiration, no wheezes, rales, ronchi Heart:  regular rate and rhythm, no murmurs, gallops, or rubs Abdomen: soft, non-tender, non-distended, normal bowel sounds Extremities: no cyanosis, clubbing, or edema Neurologic: alert & oriented X3, cranial nerves II-XII intact, strength grossly intact, sensation intact to light touch  Current Outpatient Prescriptions on File Prior to Visit  Medication Sig Dispense Refill  . carvedilol (COREG) 3.125 MG tablet Take 1 tablet (3.125 mg total) by mouth 2 (two) times daily with a meal.  60 tablet  3  . clopidogrel (PLAVIX) 75 MG tablet Take 1 tablet (75 mg total) by mouth daily with breakfast.  30 tablet  3  . cyclobenzaprine (FLEXERIL) 10 MG tablet Take 10 mg by mouth every morning.      . gabapentin (NEURONTIN) 300 MG capsule Take 300-600 mg by mouth 3 (three) times daily. Take 600mg  in AM and 600mg  in PM and 300mg  at bedtime      . HYDROcodone-acetaminophen (NORCO/VICODIN) 5-325 MG per tablet Take 1-2 tablets by mouth every 6 (six) hours as needed. For pain      . lisinopril (PRINIVIL,ZESTRIL) 2.5 MG tablet Take 1 tablet (2.5 mg total) by mouth daily.  30 tablet  3  . pravastatin (PRAVACHOL) 20 MG tablet Take 1 tablet (20 mg total) by mouth daily.  30 tablet  3    Assessment/Plan

## 2012-01-26 NOTE — Assessment & Plan Note (Addendum)
The patient presents for follow-up of acute stroke.  The patient is currently on Plavix, and states that he is able to afford this medication.  Patient quit smoking 5 years ago.  He still notes mild balance disturbances that do not affect ambulation, and left-sided hemianopsia for which he would like ophtho referral. -continue plavix -risk factor modification as below -continue working with PT/OT -Ophtho referral initially ordered, but no remaining appointments for this month.  Patient instructed to obtain orange card, then we will re-try ophtho referral

## 2012-01-26 NOTE — Assessment & Plan Note (Signed)
-  patient declines flu shot -patient given fecal occult blood cards for colon cancer screening -patient given information for obtaining [employer]

## 2012-01-26 NOTE — Assessment & Plan Note (Addendum)
BP elevated today both initially and upon re-check.  Patient has a history of HTN, untreated up until his last hospitalization -increase lisinopril from 2.5 to 5 mg/day -increase coreg from 3.125 to 6.25 mg/day -recheck BP in 2-4 months -check BMET today  Addendum 10/11: BMET shows a potassium of 5.1, which is within normal limits, but higher than his previous value.  Will re-check BMET at his next visit, given the increase in lisinopril dosage.

## 2012-01-27 NOTE — Addendum Note (Signed)
Addended by: Linward Headland on: 01/27/2012 09:50 AM   Modules accepted: Orders

## 2012-02-02 ENCOUNTER — Encounter: Payer: Self-pay | Admitting: *Deleted

## 2012-02-03 ENCOUNTER — Encounter: Payer: Self-pay | Admitting: Cardiology

## 2012-02-03 ENCOUNTER — Ambulatory Visit (INDEPENDENT_AMBULATORY_CARE_PROVIDER_SITE_OTHER): Payer: Self-pay | Admitting: Cardiology

## 2012-02-03 VITALS — BP 121/76 | HR 83 | Ht 68.5 in | Wt 164.0 lb

## 2012-02-03 DIAGNOSIS — I428 Other cardiomyopathies: Secondary | ICD-10-CM

## 2012-02-03 DIAGNOSIS — I422 Other hypertrophic cardiomyopathy: Secondary | ICD-10-CM

## 2012-02-03 DIAGNOSIS — I6350 Cerebral infarction due to unspecified occlusion or stenosis of unspecified cerebral artery: Secondary | ICD-10-CM

## 2012-02-03 DIAGNOSIS — I429 Cardiomyopathy, unspecified: Secondary | ICD-10-CM | POA: Insufficient documentation

## 2012-02-03 DIAGNOSIS — I749 Embolism and thrombosis of unspecified artery: Secondary | ICD-10-CM

## 2012-02-03 DIAGNOSIS — E785 Hyperlipidemia, unspecified: Secondary | ICD-10-CM

## 2012-02-03 DIAGNOSIS — I639 Cerebral infarction, unspecified: Secondary | ICD-10-CM

## 2012-02-03 DIAGNOSIS — I10 Essential (primary) hypertension: Secondary | ICD-10-CM

## 2012-02-03 DIAGNOSIS — I7090 Unspecified atherosclerosis: Secondary | ICD-10-CM

## 2012-02-03 LAB — BASIC METABOLIC PANEL
BUN: 12 mg/dL (ref 6–23)
Chloride: 101 mEq/L (ref 96–112)
Glucose, Bld: 107 mg/dL — ABNORMAL HIGH (ref 70–99)
Potassium: 4.5 mEq/L (ref 3.5–5.1)
Sodium: 138 mEq/L (ref 135–145)

## 2012-02-03 MED ORDER — COLCHICINE 0.6 MG PO TABS: 0.6000 mg | ORAL_TABLET | Freq: Every day | ORAL | Status: DC

## 2012-02-03 MED ORDER — CARVEDILOL 12.5 MG PO TABS: 12.5000 mg | ORAL_TABLET | Freq: Two times a day (BID) | ORAL | Status: DC

## 2012-02-03 MED ORDER — PRAVASTATIN SODIUM 40 MG PO TABS: 40.0000 mg | ORAL_TABLET | Freq: Every evening | ORAL | Status: DC
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
## 2012-02-03 NOTE — Patient Instructions (Signed)
Increase coreg(carvedilol) to 9.375mg  twice a day for 4 days, then increase to 12.5mg  two times a day. Using your current supply of 6.25mg  tablets this  will be one and one-half 6.25mg  tablets two times a day for 4 days, then two 6.25mg  tablets two times a day. The new prescription will be one 12.5mg  tablet two times a day.   Increase pravachol(pravastatin) to 40mg  daily. You can take two 20mg  tablets daily in the evening  at the same time and use your current supply. The new prescription will be one 40mg  tablet daily in the evening.   Start colchicine 0.6mg  daily.   Your physician recommends that you have lab today--BMET/BNP.  Your physician has requested that you have en exercise stress myoview. For further information please visit [email or web address] Please follow instruction sheet, as given. This can be scheduled after you find out about financial help from [facility].   Your physician has recommended that you wear an event monitor. Event monitors are medical devices that record the heart's electrical activity. Doctors most often [place] these monitors to diagnose arrhythmias. Arrhythmias are problems with the speed or rhythm of the heartbeat. The monitor is a small, portable device. You can wear one while you do your normal daily activities. This is usually used to diagnose what is causing palpitations/syncope (passing out). This can be after you find out about financial help from [facility].   Your physician recommends that you schedule a follow-up appointment in: 1 month with Dr Shirlee Latch.

## 2012-02-13 ENCOUNTER — Telehealth: Payer: Self-pay | Admitting: *Deleted

## 2012-02-13 NOTE — Telephone Encounter (Signed)
I spoke with Onnie Boer on Friday. She stated that Mrs. Priddy has been assigned as his Child psychotherapist from [employer], [phone] ext [phone]. He has been set-up for PT and Home Health Nurse. He was also given the application for financial counseling though [facility]. Patient is waiting on response.

## 2012-03-12 ENCOUNTER — Encounter: Payer: Self-pay | Admitting: Cardiology

## 2012-03-12 ENCOUNTER — Ambulatory Visit (INDEPENDENT_AMBULATORY_CARE_PROVIDER_SITE_OTHER): Payer: Self-pay | Admitting: Cardiology

## 2012-03-12 ENCOUNTER — Other Ambulatory Visit: Payer: Self-pay

## 2012-03-12 VITALS — BP 126/72 | HR 71 | Resp 18 | Ht 69.0 in | Wt 158.0 lb

## 2012-03-12 DIAGNOSIS — I429 Cardiomyopathy, unspecified: Secondary | ICD-10-CM

## 2012-03-12 DIAGNOSIS — I422 Other hypertrophic cardiomyopathy: Secondary | ICD-10-CM

## 2012-03-12 DIAGNOSIS — I10 Essential (primary) hypertension: Secondary | ICD-10-CM

## 2012-03-12 DIAGNOSIS — I428 Other cardiomyopathies: Secondary | ICD-10-CM

## 2012-03-12 DIAGNOSIS — I639 Cerebral infarction, unspecified: Secondary | ICD-10-CM

## 2012-03-12 DIAGNOSIS — E785 Hyperlipidemia, unspecified: Secondary | ICD-10-CM

## 2012-03-12 MED ORDER — LISINOPRIL 10 MG PO TABS: 10.0000 mg | ORAL_TABLET | Freq: Every day | ORAL | Status: DC
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
## 2012-03-12 NOTE — Patient Instructions (Addendum)
Your physician recommends that you schedule a follow-up appointment in: AFTER ECHO IN JAN  Your physician has requested that you have an echocardiogram. Echocardiography is a painless test that uses sound waves to create images of your heart. It provides your doctor with information about the size and shape of your heart and how well your heart's chambers and valves are working. This procedure takes approximately one hour. There are no restrictions for this procedure. SCHEDULE IN JAN  INCREASE LISINOPRIL TO 10 MG ONCE DAILY  Your physician recommends that you return for lab work in: 2 WEEKS

## 2012-03-20 ENCOUNTER — Other Ambulatory Visit (HOSPITAL_COMMUNITY): Payer: Self-pay

## 2012-03-26 ENCOUNTER — Other Ambulatory Visit: Payer: Self-pay

## 2012-03-27 ENCOUNTER — Other Ambulatory Visit (INDEPENDENT_AMBULATORY_CARE_PROVIDER_SITE_OTHER): Payer: Self-pay

## 2012-03-27 DIAGNOSIS — I429 Cardiomyopathy, unspecified: Secondary | ICD-10-CM

## 2012-03-27 DIAGNOSIS — I428 Other cardiomyopathies: Secondary | ICD-10-CM

## 2012-03-27 LAB — BASIC METABOLIC PANEL
BUN: 18 mg/dL (ref 6–23)
Calcium: 9.7 mg/dL (ref 8.4–10.5)
GFR: 94.39 mL/min (ref >60.00)
Potassium: 3.7 mEq/L (ref 3.5–5.1)
Sodium: 136 mEq/L (ref 135–145)

## 2012-04-20 ENCOUNTER — Other Ambulatory Visit (HOSPITAL_COMMUNITY): Payer: Self-pay

## 2012-04-25 NOTE — Addendum Note (Signed)
Addended by: Bufford Spikes on: 04/25/2012 11:55 AM   Modules accepted: Orders

## 2012-04-26 ENCOUNTER — Ambulatory Visit (HOSPITAL_COMMUNITY): Payer: Self-pay | Attending: Cardiology | Admitting: Radiology

## 2012-04-26 DIAGNOSIS — I10 Essential (primary) hypertension: Secondary | ICD-10-CM | POA: Insufficient documentation

## 2012-04-26 DIAGNOSIS — I080 Rheumatic disorders of both mitral and aortic valves: Secondary | ICD-10-CM | POA: Insufficient documentation

## 2012-04-26 DIAGNOSIS — I428 Other cardiomyopathies: Secondary | ICD-10-CM | POA: Insufficient documentation

## 2012-04-26 DIAGNOSIS — I369 Nonrheumatic tricuspid valve disorder, unspecified: Secondary | ICD-10-CM | POA: Insufficient documentation

## 2012-04-26 DIAGNOSIS — I429 Cardiomyopathy, unspecified: Secondary | ICD-10-CM

## 2012-04-26 DIAGNOSIS — I509 Heart failure, unspecified: Secondary | ICD-10-CM | POA: Insufficient documentation

## 2012-04-26 NOTE — Progress Notes (Signed)
Echocardiogram performed.  

## 2012-05-16 ENCOUNTER — Encounter: Payer: Self-pay | Admitting: Cardiology

## 2012-05-16 ENCOUNTER — Ambulatory Visit (INDEPENDENT_AMBULATORY_CARE_PROVIDER_SITE_OTHER): Payer: Self-pay | Admitting: Cardiology

## 2012-05-16 VITALS — BP 127/79 | HR 82 | Ht 68.0 in | Wt 152.6 lb

## 2012-05-16 DIAGNOSIS — I428 Other cardiomyopathies: Secondary | ICD-10-CM

## 2012-05-16 DIAGNOSIS — E785 Hyperlipidemia, unspecified: Secondary | ICD-10-CM

## 2012-05-16 DIAGNOSIS — I639 Cerebral infarction, unspecified: Secondary | ICD-10-CM | POA: Insufficient documentation

## 2012-05-16 DIAGNOSIS — I429 Cardiomyopathy, unspecified: Secondary | ICD-10-CM

## 2012-05-16 DIAGNOSIS — I6350 Cerebral infarction due to unspecified occlusion or stenosis of unspecified cerebral artery: Secondary | ICD-10-CM

## 2012-05-16 NOTE — Patient Instructions (Addendum)
Your physician recommends that you have  lab work today--lipid profile/liver profile/CBCd/TSH.  Dr Shirlee Latch recommends you walk 30 minutes 5-6 times a week.   Your physician has recommended that you wear an event monitor. Event monitors are medical devices that record the heart's electrical activity. Doctors most often [place] these monitors to diagnose arrhythmias. Arrhythmias are problems with the speed or rhythm of the heartbeat. The monitor is a small, portable device. You can wear one while you do your normal daily activities. This is usually used to diagnose what is causing palpitations/syncope (passing out). Let us know if you get Medicaid or other financial assistance so we can arrange this monitor. I have also given you paperwork to complete to see if you would qualify for financial assistance from the monitor company.   Your physician wants you to follow-up in: 6 months with Dr Shirlee Latch.(July 2014). You will receive a reminder letter in the mail two months in advance. If you don't receive a letter, please call our office to schedule the follow-up appointment.
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
## 2012-05-17 LAB — BASIC METABOLIC PANEL
BUN: 14 mg/dL (ref 6–23)
Calcium: 9.7 mg/dL (ref 8.4–10.5)
Chloride: 106 mEq/L (ref 96–112)
Creatinine, Ser: 1 mg/dL (ref 0.4–1.5)
GFR: 77.64 mL/min (ref >60.00)

## 2012-05-17 LAB — HEPATIC FUNCTION PANEL
ALT: 16 U/L (ref 0–53)
Albumin: 3.9 g/dL (ref 3.5–5.2)
Alkaline Phosphatase: 81 U/L (ref 39–117)
Total Protein: 7.2 g/dL (ref 6.0–8.3)

## 2012-05-17 LAB — LDL CHOLESTEROL, DIRECT: Direct LDL: 93.2 mg/dL

## 2012-05-17 LAB — TSH: TSH: 1.32 u[IU]/mL (ref 0.35–5.50)

## 2012-05-17 LAB — LIPID PANEL
Cholesterol: 168 mg/dL (ref 0–200)
VLDL: 42.6 mg/dL — ABNORMAL HIGH (ref 0.0–40.0)

## 2012-05-24 ENCOUNTER — Other Ambulatory Visit: Payer: Self-pay | Admitting: *Deleted

## 2012-05-24 ENCOUNTER — Telehealth: Payer: Self-pay | Admitting: Cardiology

## 2012-05-24 DIAGNOSIS — E785 Hyperlipidemia, unspecified: Secondary | ICD-10-CM

## 2012-05-24 DIAGNOSIS — E875 Hyperkalemia: Secondary | ICD-10-CM

## 2012-05-24 MED ORDER — PRAVASTATIN SODIUM 80 MG PO TABS: 80.0000 mg | ORAL_TABLET | Freq: Every evening | ORAL | Status: DC

## 2012-05-24 NOTE — Telephone Encounter (Signed)
05-24-12  Have call patient several times to schedule event monitor.  Unable to leave a message.  Message states mailbox is full.

## 2012-05-25 ENCOUNTER — Ambulatory Visit (INDEPENDENT_AMBULATORY_CARE_PROVIDER_SITE_OTHER): Payer: Self-pay | Admitting: *Deleted

## 2012-05-25 DIAGNOSIS — I10 Essential (primary) hypertension: Secondary | ICD-10-CM

## 2012-05-25 LAB — BASIC METABOLIC PANEL
CO2: 31 mEq/L (ref 19–32)
Calcium: 9.5 mg/dL (ref 8.4–10.5)
Chloride: 103 mEq/L (ref 96–112)
Creat: 0.99 mg/dL (ref 0.50–1.35)
Glucose, Bld: 95 mg/dL (ref 70–99)

## 2012-05-28 ENCOUNTER — Telehealth: Payer: Self-pay | Admitting: Cardiology

## 2012-05-29 ENCOUNTER — Ambulatory Visit (INDEPENDENT_AMBULATORY_CARE_PROVIDER_SITE_OTHER): Payer: Self-pay | Admitting: *Deleted

## 2012-05-29 DIAGNOSIS — I10 Essential (primary) hypertension: Secondary | ICD-10-CM

## 2012-05-29 LAB — BASIC METABOLIC PANEL
BUN: 12 mg/dL (ref 6–23)
CO2: 27 mEq/L (ref 19–32)
Calcium: 9.6 mg/dL (ref 8.4–10.5)
Chloride: 101 mEq/L (ref 96–112)
Creatinine, Ser: 0.8 mg/dL (ref 0.4–1.5)

## 2012-06-14 ENCOUNTER — Telehealth: Payer: Self-pay | Admitting: Cardiology

## 2012-06-14 DIAGNOSIS — E785 Hyperlipidemia, unspecified: Secondary | ICD-10-CM

## 2012-06-14 MED ORDER — PRAVASTATIN SODIUM 80 MG PO TABS: 80.0000 mg | ORAL_TABLET | Freq: Every evening | ORAL | Status: DC

## 2012-06-14 NOTE — Telephone Encounter (Signed)
Pt was to have an increase in  Pravastatin to  80mg  , and gave [place] the wrong pharmacy, was to be called into [facility]

## 2012-07-03 ENCOUNTER — Other Ambulatory Visit (HOSPITAL_COMMUNITY): Payer: Self-pay | Admitting: Internal Medicine

## 2012-07-14 ENCOUNTER — Telehealth: Payer: Self-pay | Admitting: *Deleted

## 2012-07-14 NOTE — Telephone Encounter (Signed)
Left message that 07-25-12 appointment has been canceled. Please call back to reschedule.

## 2012-07-20 NOTE — Telephone Encounter (Signed)
Called pt no answer. Left a message for him to call the office and r/s when he gets time.

## 2012-07-24 ENCOUNTER — Other Ambulatory Visit (INDEPENDENT_AMBULATORY_CARE_PROVIDER_SITE_OTHER): Payer: Self-pay

## 2012-07-24 DIAGNOSIS — E785 Hyperlipidemia, unspecified: Secondary | ICD-10-CM

## 2012-07-24 LAB — LIPID PANEL
Cholesterol: 147 mg/dL (ref 0–200)
HDL: 34.3 mg/dL — ABNORMAL LOW (ref >39.00)
Triglycerides: 185 mg/dL — ABNORMAL HIGH (ref 0.0–149.0)
VLDL: 37 mg/dL (ref 0.0–40.0)

## 2012-07-24 LAB — HEPATIC FUNCTION PANEL
ALT: 13 U/L (ref 0–53)
Albumin: 3.7 g/dL (ref 3.5–5.2)
Bilirubin, Direct: 0 mg/dL (ref 0.0–0.3)
Total Protein: 6.9 g/dL (ref 6.0–8.3)

## 2012-07-25 ENCOUNTER — Ambulatory Visit: Payer: Self-pay | Admitting: Nurse Practitioner

## 2012-08-08 ENCOUNTER — Ambulatory Visit: Payer: Self-pay | Admitting: Nurse Practitioner

## 2012-08-15 ENCOUNTER — Telehealth: Payer: Self-pay | Admitting: Cardiology

## 2012-08-15 NOTE — Telephone Encounter (Signed)
New problem   Pt is having trouble urinating and need to talk to nurse concerning this matter and possibly get some medication for this problem. Please call pt

## 2012-08-15 NOTE — Telephone Encounter (Signed)
Pt called because he said is having problems urinating. Pt would like to have a medication prescribed for that. Pt was made aware that he needs to see a PCP . Pt was seen by Dr. Blake Divine. Manson Passey MD on 01/26/12 for Hypertension, so the office phone # given to pt so pt can  call and make an appointment . Pt verbalized understanding.

## 2012-09-05 ENCOUNTER — Other Ambulatory Visit: Payer: Self-pay | Admitting: *Deleted

## 2012-09-05 DIAGNOSIS — I429 Cardiomyopathy, unspecified: Secondary | ICD-10-CM

## 2012-09-05 MED ORDER — LISINOPRIL 10 MG PO TABS: 10.0000 mg | ORAL_TABLET | Freq: Every day | ORAL | Status: DC

## 2012-09-14 ENCOUNTER — Other Ambulatory Visit: Payer: Self-pay | Admitting: *Deleted

## 2012-09-14 DIAGNOSIS — I429 Cardiomyopathy, unspecified: Secondary | ICD-10-CM

## 2012-09-14 MED ORDER — LISINOPRIL 10 MG PO TABS: 10.0000 mg | ORAL_TABLET | Freq: Every day | ORAL | Status: DC

## 2012-11-25 ENCOUNTER — Other Ambulatory Visit: Payer: Self-pay | Admitting: Cardiology

## 2013-01-23 ENCOUNTER — Other Ambulatory Visit: Payer: Self-pay | Admitting: Cardiology

## 2013-02-07 ENCOUNTER — Ambulatory Visit (INDEPENDENT_AMBULATORY_CARE_PROVIDER_SITE_OTHER): Payer: Self-pay | Admitting: Cardiology

## 2013-02-07 ENCOUNTER — Encounter: Payer: Self-pay | Admitting: Cardiology

## 2013-02-07 VITALS — BP 122/70 | HR 70 | Ht 68.0 in | Wt 155.0 lb

## 2013-02-07 DIAGNOSIS — I428 Other cardiomyopathies: Secondary | ICD-10-CM

## 2013-02-07 DIAGNOSIS — E785 Hyperlipidemia, unspecified: Secondary | ICD-10-CM

## 2013-02-07 MED ORDER — LISINOPRIL 5 MG PO TABS: 5.0000 mg | ORAL_TABLET | Freq: Every day | ORAL | Status: DC

## 2013-02-07 NOTE — Patient Instructions (Addendum)
Your physician wants you to follow-up in:  12 months. You will receive a reminder letter in the mail two months in advance. If you don't receive a letter, please call our office to schedule the follow-up appointment.  Your physician recommends that you return for fasting lab work in: one week--BMP and fasting lipid profile. The lab opens at 7:30 AM every week day  Your physician has recommended you make the following change in your medication:  Resume lisinopril 5 mg by mouth daily
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
## 2013-02-14 ENCOUNTER — Other Ambulatory Visit (INDEPENDENT_AMBULATORY_CARE_PROVIDER_SITE_OTHER): Payer: Self-pay

## 2013-02-14 DIAGNOSIS — E785 Hyperlipidemia, unspecified: Secondary | ICD-10-CM

## 2013-02-14 DIAGNOSIS — I428 Other cardiomyopathies: Secondary | ICD-10-CM

## 2013-02-14 LAB — LIPID PANEL
Cholesterol: 156 mg/dL (ref 0–200)
HDL: 36.8 mg/dL — ABNORMAL LOW (ref >39.00)
Triglycerides: 317 mg/dL — ABNORMAL HIGH (ref 0.0–149.0)
VLDL: 63.4 mg/dL — ABNORMAL HIGH (ref 0.0–40.0)

## 2013-02-14 LAB — BASIC METABOLIC PANEL
Creatinine, Ser: 0.8 mg/dL (ref 0.4–1.5)
GFR: 105.2 mL/min (ref >60.00)
Glucose, Bld: 89 mg/dL (ref 70–99)
Potassium: 4.3 mEq/L (ref 3.5–5.1)
Sodium: 140 mEq/L (ref 135–145)

## 2013-02-25 ENCOUNTER — Other Ambulatory Visit: Payer: Self-pay | Admitting: Cardiology

## 2013-02-25 NOTE — Progress Notes (Signed)
Unable to reach pt by telephone

## 2013-04-17 ENCOUNTER — Other Ambulatory Visit: Payer: Self-pay | Admitting: Cardiology

## 2013-06-26 ENCOUNTER — Telehealth: Payer: Self-pay | Admitting: Cardiology

## 2013-06-26 NOTE — Telephone Encounter (Signed)
Pt states he had a stroke in 2013. He has been doing find , but for a few weeks he has  no  appetite. Pt was made aware that he will need to call his PCP to see what he can do for him. Pt states he has been seen a general doctor in a primary care clinic, so he will go to see him today.

## 2013-06-26 NOTE — Telephone Encounter (Signed)
Pt has no appetite and wants to know if there is anything we can suggest he do, asked if he had a PCP and he said no, pls call [phone]

## 2013-08-15 ENCOUNTER — Other Ambulatory Visit: Payer: Self-pay | Admitting: Cardiology

## 2013-09-01 ENCOUNTER — Encounter (HOSPITAL_COMMUNITY): Payer: Self-pay | Admitting: Emergency Medicine

## 2013-09-01 ENCOUNTER — Emergency Department (HOSPITAL_COMMUNITY): Payer: Medicare Other

## 2013-09-01 ENCOUNTER — Emergency Department (HOSPITAL_COMMUNITY)
Admission: EM | Admit: 2013-09-01 | Discharge: 2013-09-01 | Disposition: A | Discharge status: Home / Self Care | Payer: Medicare Other | Attending: Emergency Medicine | Admitting: Emergency Medicine

## 2013-09-01 DIAGNOSIS — E785 Hyperlipidemia, unspecified: Secondary | ICD-10-CM | POA: Insufficient documentation

## 2013-09-01 DIAGNOSIS — G8929 Other chronic pain: Secondary | ICD-10-CM | POA: Diagnosis not present

## 2013-09-01 DIAGNOSIS — R4182 Altered mental status, unspecified: Secondary | ICD-10-CM | POA: Diagnosis not present

## 2013-09-01 DIAGNOSIS — Z79899 Other long term (current) drug therapy: Secondary | ICD-10-CM | POA: Diagnosis not present

## 2013-09-01 DIAGNOSIS — F911 Conduct disorder, childhood-onset type: Secondary | ICD-10-CM | POA: Diagnosis present

## 2013-09-01 DIAGNOSIS — Z872 Personal history of diseases of the skin and subcutaneous tissue: Secondary | ICD-10-CM | POA: Diagnosis not present

## 2013-09-01 DIAGNOSIS — I509 Heart failure, unspecified: Secondary | ICD-10-CM | POA: Diagnosis not present

## 2013-09-01 DIAGNOSIS — I10 Essential (primary) hypertension: Secondary | ICD-10-CM | POA: Insufficient documentation

## 2013-09-01 DIAGNOSIS — Z7902 Long term (current) use of antithrombotics/antiplatelets: Secondary | ICD-10-CM | POA: Diagnosis not present

## 2013-09-01 DIAGNOSIS — Z87891 Personal history of nicotine dependence: Secondary | ICD-10-CM | POA: Diagnosis not present

## 2013-09-01 DIAGNOSIS — Z8673 Personal history of transient ischemic attack (TIA), and cerebral infarction without residual deficits: Secondary | ICD-10-CM | POA: Diagnosis not present

## 2013-09-01 LAB — ETHANOL

## 2013-09-01 LAB — URINALYSIS, ROUTINE W REFLEX MICROSCOPIC
Bilirubin Urine: NEGATIVE
GLUCOSE, UA: NEGATIVE mg/dL
HGB URINE DIPSTICK: NEGATIVE
Ketones, ur: NEGATIVE mg/dL
Leukocytes, UA: NEGATIVE
Nitrite: NEGATIVE
PROTEIN: NEGATIVE mg/dL
Specific Gravity, Urine: 1.012 (ref 1.005–1.030)
Urobilinogen, UA: 1 mg/dL (ref 0.0–1.0)
pH: 7 (ref 5.0–8.0)

## 2013-09-01 LAB — CBC WITH DIFFERENTIAL/PLATELET
BASOS ABS: 0 10*3/uL (ref 0.0–0.1)
BASOS PCT: 0 % (ref 0–1)
EOS ABS: 0 10*3/uL (ref 0.0–0.7)
Eosinophils Relative: 0 % (ref 0–5)
HCT: 43.7 % (ref 39.0–52.0)
HEMOGLOBIN: 15.4 g/dL (ref 13.0–17.0)
Lymphocytes Relative: 10 % — ABNORMAL LOW (ref 12–46)
Lymphs Abs: 1.4 10*3/uL (ref 0.7–4.0)
MCH: 33.4 pg (ref 26.0–34.0)
MCHC: 35.2 g/dL (ref 30.0–36.0)
MCV: 94.8 fL (ref 78.0–100.0)
MONOS PCT: 3 % (ref 3–12)
Monocytes Absolute: 0.5 10*3/uL (ref 0.1–1.0)
NEUTROS PCT: 87 % — AB (ref 43–77)
Neutro Abs: 12.8 10*3/uL — ABNORMAL HIGH (ref 1.7–7.7)
Platelets: 313 10*3/uL (ref 150–400)
RBC: 4.61 MIL/uL (ref 4.22–5.81)
RDW: 13.4 % (ref 11.5–15.5)
WBC: 14.7 10*3/uL — ABNORMAL HIGH (ref 4.0–10.5)

## 2013-09-01 LAB — TROPONIN I: Troponin I: <0.3 ng/mL (ref <0.30)

## 2013-09-01 LAB — RAPID URINE DRUG SCREEN, HOSP PERFORMED
Amphetamines: NOT DETECTED
BENZODIAZEPINES: POSITIVE — AB
Barbiturates: NOT DETECTED
Cocaine: NOT DETECTED
Opiates: NOT DETECTED
Tetrahydrocannabinol: POSITIVE — AB

## 2013-09-01 LAB — COMPREHENSIVE METABOLIC PANEL
ALBUMIN: 3.9 g/dL (ref 3.5–5.2)
ALK PHOS: 93 U/L (ref 39–117)
ALT: 9 U/L (ref 0–53)
AST: 16 U/L (ref 0–37)
BUN: 8 mg/dL (ref 6–23)
CO2: 22 mEq/L (ref 19–32)
Calcium: 10 mg/dL (ref 8.4–10.5)
Chloride: 100 mEq/L (ref 96–112)
Creatinine, Ser: 0.69 mg/dL (ref 0.50–1.35)
GFR calc Af Amer: >90 mL/min (ref >90)
GFR calc non Af Amer: >90 mL/min (ref >90)
Glucose, Bld: 133 mg/dL — ABNORMAL HIGH (ref 70–99)
POTASSIUM: 3.6 meq/L — AB (ref 3.7–5.3)
Sodium: 140 mEq/L (ref 137–147)
TOTAL PROTEIN: 7.6 g/dL (ref 6.0–8.3)
Total Bilirubin: 0.3 mg/dL (ref 0.3–1.2)

## 2013-09-01 MED ORDER — SODIUM CHLORIDE 0.9 % IV BOLUS (SEPSIS)
500.0000 mL | Freq: Once | INTRAVENOUS | Status: AC
  Administered 2013-09-01: 500 mL via INTRAVENOUS

## 2013-09-01 NOTE — ED Notes (Signed)
Pt resting quietly. Sitter remains at bedside.

## 2013-09-01 NOTE — Discharge Instructions (Signed)
Altered Mental Status °Altered mental status most often refers to an abnormal change in your responsiveness and awareness. It can affect your speech, thought, mobility, memory, attention span, or alertness. It can range from slight confusion to complete unresponsiveness (coma). Altered mental status can be a sign of a serious underlying medical condition. Rapid evaluation and medical treatment is necessary for patients having an altered mental status. °CAUSES  °· Low blood sugar (hypoglycemia) or diabetes. °· Severe loss of body fluids (dehydration) or a body salt (electrolyte) imbalance. °· A stroke or other neurologic problem, such as dementia or delirium. °· A head injury or tumor. °· A drug or alcohol overdose. °· Exposure to toxins or poisons. °· Depression, anxiety, and stress. °· A low oxygen level (hypoxia). °· An infection. °· Blood loss. °· Twitching or shaking (seizure). °· Heart problems, such as heart attack or heart rhythm problems (arrhythmias). °· A body temperature that is too low or too high (hypothermia or hyperthermia). °DIAGNOSIS  °A diagnosis is based on your history, symptoms, physical and neurologic examinations, and diagnostic tests. Diagnostic tests may include: °· Measurement of your blood pressure, pulse, breathing, and oxygen levels (vital signs). °· Blood tests. °· Urine tests. °· X-ray exams. °· A computerized magnetic scan (magnetic resonance imaging, MRI). °· A computerized X-ray scan (computed tomography, CT scan). °TREATMENT  °Treatment will depend on the cause. Treatment may include: °· Management of an underlying medical or mental health condition. °· Critical care or support in the hospital. °HOME CARE INSTRUCTIONS  °· Only take over-the-counter or prescription medicines for pain, discomfort, or fever as directed by your caregiver. °· Manage underlying conditions as directed by your caregiver. °· Eat a healthy, well-balanced diet to maintain strength. °· Join a support group or  prevention program to cope with the condition or trauma that caused the altered mental status. Ask your caregiver to help choose a program that works for you. °· Follow up with your caregiver for further examination, therapy, or testing as directed. °SEEK MEDICAL CARE IF:  °· You feel unwell or have chills. °· You or your family notice a change in your behavior or your alertness. °· You have trouble following your caregiver's treatment plan. °· You have questions or concerns. °SEEK IMMEDIATE MEDICAL CARE IF:  °· You have a rapid heartbeat or have chest pain. °· You have difficulty breathing. °· You have a fever. °· You have a headache with a stiff neck. °· You cough up blood. °· You have blood in your urine or stool. °· You have severe agitation or confusion. °MAKE SURE YOU:  °· Understand these instructions. °· Will watch your condition. °· Will get help right away if you are not doing well or get worse. °Document Released: 09/22/2009 Document Revised: 06/27/2011 Document Reviewed: 09/22/2009 °[employer]® Patient Information ©2014 [employer]. ° °
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
## 2013-09-01 NOTE — ED Notes (Signed)
Pt lives at home  By his self and has friends that come over to visit. Today friend came over and seen pt at 1000 drinking ETOH. Friend went back home and then heard another friend say that they called EMS due to he was combative. EMS arrived and had to give pt haldol 2.5mg  and versed 2.5mg . Pt did calm down and is alert to person and place.  Friend stats that pt does take oxycodone that they just increased ut he did not think that he took them. Pupils are not pin point at this time, pt is calm but is drowsy.

## 2013-09-01 NOTE — ED Notes (Signed)
Bed: WA03 Expected date:  Expected time:  Means of arrival:  Comments: agitation

## 2013-09-24 ENCOUNTER — Other Ambulatory Visit: Payer: Self-pay | Admitting: Specialist

## 2013-09-24 DIAGNOSIS — M5412 Radiculopathy, cervical region: Secondary | ICD-10-CM

## 2013-09-25 ENCOUNTER — Other Ambulatory Visit: Payer: 59

## 2013-09-28 ENCOUNTER — Ambulatory Visit
Admission: RE | Admit: 2013-09-28 | Discharge: 2013-09-28 | Disposition: A | Discharge status: Home / Self Care | Payer: Medicare Other | Source: Ambulatory Visit | Attending: Specialist | Admitting: Specialist

## 2013-09-28 DIAGNOSIS — M5412 Radiculopathy, cervical region: Secondary | ICD-10-CM

## 2013-09-30 ENCOUNTER — Ambulatory Visit (INDEPENDENT_AMBULATORY_CARE_PROVIDER_SITE_OTHER): Payer: Medicare Other | Admitting: Emergency Medicine

## 2013-09-30 VITALS — BP 142/80 | HR 64 | Temp 98.4°F | Resp 16 | Ht 66.75 in | Wt 142.6 lb

## 2013-09-30 DIAGNOSIS — F40298 Other specified phobia: Secondary | ICD-10-CM

## 2013-09-30 DIAGNOSIS — M4802 Spinal stenosis, cervical region: Secondary | ICD-10-CM

## 2013-09-30 DIAGNOSIS — F40240 Claustrophobia: Secondary | ICD-10-CM

## 2013-09-30 MED ORDER — DIAZEPAM 5 MG PO TABS: ORAL_TABLET | ORAL | Status: DC
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
## 2013-10-06 ENCOUNTER — Ambulatory Visit
Admission: RE | Admit: 2013-10-06 | Discharge: 2013-10-06 | Disposition: A | Discharge status: Home / Self Care | Payer: Medicare Other | Source: Ambulatory Visit | Attending: Specialist | Admitting: Specialist

## 2013-10-06 ENCOUNTER — Telehealth: Payer: Self-pay | Admitting: Family Medicine

## 2013-10-06 NOTE — Telephone Encounter (Signed)
Pt stated that he wanted to wait to make an appt and will call back.

## 2013-10-06 NOTE — Telephone Encounter (Signed)
Message copied by Chinita Pester on Sun Oct 06, 2013  3:41 PM ------      Message from: [place], [place] S      Created: Mon Sep 30, 2013  2:49 PM       Wants to establish care at [phone] ------

## 2013-10-06 NOTE — Telephone Encounter (Signed)
LMOM to CB. 

## 2013-10-09 NOTE — Progress Notes (Signed)
Spoke with patient and he is going to find primary care closer to his side of town.

## 2013-10-31 NOTE — Telephone Encounter (Signed)
Close encounter 

## 2013-11-04 ENCOUNTER — Other Ambulatory Visit: Payer: Self-pay | Admitting: Cardiology

## 2013-12-13 ENCOUNTER — Other Ambulatory Visit: Payer: Self-pay | Admitting: Cardiology

## 2014-02-04 ENCOUNTER — Encounter: Payer: Self-pay | Admitting: *Deleted

## 2014-02-04 ENCOUNTER — Encounter: Payer: Self-pay | Admitting: Cardiology

## 2014-02-04 ENCOUNTER — Ambulatory Visit (INDEPENDENT_AMBULATORY_CARE_PROVIDER_SITE_OTHER): Payer: Medicare Other | Admitting: Cardiology

## 2014-02-04 VITALS — BP 108/64 | HR 62 | Ht 68.0 in | Wt 134.0 lb

## 2014-02-04 DIAGNOSIS — I426 Alcoholic cardiomyopathy: Secondary | ICD-10-CM

## 2014-02-04 DIAGNOSIS — R634 Abnormal weight loss: Secondary | ICD-10-CM

## 2014-02-04 DIAGNOSIS — E785 Hyperlipidemia, unspecified: Secondary | ICD-10-CM

## 2014-02-04 DIAGNOSIS — I429 Cardiomyopathy, unspecified: Secondary | ICD-10-CM

## 2014-02-04 LAB — HEPATIC FUNCTION PANEL
ALT: 9 U/L (ref 0–53)
AST: 18 U/L (ref 0–37)
Albumin: 3.2 g/dL — ABNORMAL LOW (ref 3.5–5.2)
Alkaline Phosphatase: 71 U/L (ref 39–117)
Bilirubin, Direct: 0.1 mg/dL (ref 0.0–0.3)
Total Bilirubin: 0.5 mg/dL (ref 0.2–1.2)
Total Protein: 7.1 g/dL (ref 6.0–8.3)

## 2014-02-04 LAB — LIPID PANEL
Cholesterol: 134 mg/dL (ref 0–200)
HDL: 33.7 mg/dL — ABNORMAL LOW (ref >39.00)
LDL Cholesterol: 66 mg/dL (ref 0–99)
NONHDL: 100.3
Total CHOL/HDL Ratio: 4
Triglycerides: 171 mg/dL — ABNORMAL HIGH (ref 0.0–149.0)
VLDL: 34.2 mg/dL (ref 0.0–40.0)

## 2014-02-04 LAB — BASIC METABOLIC PANEL
BUN: 7 mg/dL (ref 6–23)
CO2: 25 meq/L (ref 19–32)
Calcium: 9.2 mg/dL (ref 8.4–10.5)
Chloride: 104 mEq/L (ref 96–112)
Creatinine, Ser: 0.8 mg/dL (ref 0.4–1.5)
GFR: 109.67 mL/min (ref >60.00)
Glucose, Bld: 112 mg/dL — ABNORMAL HIGH (ref 70–99)
Potassium: 3.7 mEq/L (ref 3.5–5.1)
SODIUM: 139 meq/L (ref 135–145)

## 2014-02-04 NOTE — Patient Instructions (Signed)
Your physician recommends that you have  lab work today--Lipid profile/Liver profile/BMET.  Your physician has requested that you have an echocardiogram. Echocardiography is a painless test that uses sound waves to create images of your heart. It provides your doctor with information about the size and shape of your heart and how well your heart's chambers and valves are working. This procedure takes approximately one hour. There are no restrictions for this procedure.  A chest x-ray takes a picture of the organs and structures inside the chest, including the heart, lungs, and blood vessels. This test can show several things, including, whether the heart is enlarges; whether fluid is building up in the lungs; and whether pacemaker / defibrillator leads are still in place. TODAY  Your physician wants you to follow-up in: 6-8 months with Dr Aundra Dubin. (April -June 2016).  You will receive a reminder letter in the mail two months in advance. If you don't receive a letter, please call our office to schedule the follow-up appointment.
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
## 2014-02-06 ENCOUNTER — Ambulatory Visit (INDEPENDENT_AMBULATORY_CARE_PROVIDER_SITE_OTHER)
Admission: RE | Admit: 2014-02-06 | Discharge: 2014-02-06 | Disposition: A | Discharge status: Home / Self Care | Payer: Medicare Other | Source: Ambulatory Visit | Attending: Cardiology | Admitting: Cardiology

## 2014-02-06 DIAGNOSIS — I426 Alcoholic cardiomyopathy: Secondary | ICD-10-CM

## 2014-02-06 DIAGNOSIS — E785 Hyperlipidemia, unspecified: Secondary | ICD-10-CM

## 2014-02-11 ENCOUNTER — Ambulatory Visit (HOSPITAL_COMMUNITY): Payer: Medicare Other | Attending: Internal Medicine | Admitting: Radiology

## 2014-02-11 DIAGNOSIS — E785 Hyperlipidemia, unspecified: Secondary | ICD-10-CM

## 2014-02-11 DIAGNOSIS — I426 Alcoholic cardiomyopathy: Secondary | ICD-10-CM

## 2014-02-11 DIAGNOSIS — I10 Essential (primary) hypertension: Secondary | ICD-10-CM | POA: Diagnosis not present

## 2014-02-11 DIAGNOSIS — Z87891 Personal history of nicotine dependence: Secondary | ICD-10-CM | POA: Insufficient documentation

## 2014-02-11 DIAGNOSIS — I429 Cardiomyopathy, unspecified: Secondary | ICD-10-CM | POA: Diagnosis not present

## 2014-02-11 NOTE — Progress Notes (Signed)
Echocardiogram performed.  

## 2014-02-21 ENCOUNTER — Telehealth: Payer: Self-pay | Admitting: Cardiology

## 2014-02-21 NOTE — Telephone Encounter (Signed)
New message      Pain doctor want to cut back on pts blood thinner and put him on an anti inflammatory medication.  Will this be ok?

## 2014-02-21 NOTE — Telephone Encounter (Signed)
Pt states he was asked by his pain clinic doctor if he could lower the dose of Plavix so he could try NSAIDS for pain relief.  Per Dr Wendie Agreste is not a lower dose of Plavix. Pt can try the lowest dose of NSAIDS. There is a potential for increased bruising/bleeding on NSAIDS and Plavix--pt advised.  Pt should not take aspirin if he is going to take NSAIDS--pt advised.  Pt advised, verbalized understanding.

## 2014-04-22 ENCOUNTER — Ambulatory Visit
Admission: RE | Admit: 2014-04-22 | Discharge: 2014-04-22 | Disposition: A | Discharge status: Home / Self Care | Payer: Medicare Other | Source: Ambulatory Visit | Attending: Neurosurgery | Admitting: Neurosurgery

## 2014-04-22 ENCOUNTER — Other Ambulatory Visit: Payer: Self-pay | Admitting: Neurosurgery

## 2014-04-22 DIAGNOSIS — Z981 Arthrodesis status: Secondary | ICD-10-CM

## 2014-06-17 ENCOUNTER — Other Ambulatory Visit: Payer: Self-pay | Admitting: Neurosurgery

## 2014-06-17 ENCOUNTER — Ambulatory Visit
Admission: RE | Admit: 2014-06-17 | Discharge: 2014-06-17 | Disposition: A | Discharge status: Home / Self Care | Payer: Medicare Other | Source: Ambulatory Visit | Attending: Neurosurgery | Admitting: Neurosurgery

## 2014-06-17 DIAGNOSIS — Z981 Arthrodesis status: Secondary | ICD-10-CM

## 2014-06-20 ENCOUNTER — Other Ambulatory Visit: Payer: Self-pay | Admitting: Cardiology

## 2014-07-15 ENCOUNTER — Ambulatory Visit: Payer: Medicare Other | Attending: Neurosurgery | Admitting: Physical Therapy

## 2014-07-15 ENCOUNTER — Encounter: Payer: Self-pay | Admitting: Physical Therapy

## 2014-07-15 DIAGNOSIS — R29898 Other symptoms and signs involving the musculoskeletal system: Secondary | ICD-10-CM | POA: Diagnosis not present

## 2014-07-15 DIAGNOSIS — G8929 Other chronic pain: Secondary | ICD-10-CM | POA: Diagnosis not present

## 2014-07-15 DIAGNOSIS — M542 Cervicalgia: Secondary | ICD-10-CM
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
## 2014-07-17 ENCOUNTER — Ambulatory Visit: Payer: Medicare Other | Admitting: Physical Therapy

## 2014-07-17 DIAGNOSIS — M542 Cervicalgia: Secondary | ICD-10-CM | POA: Diagnosis not present
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
## 2014-07-28 ENCOUNTER — Ambulatory Visit: Payer: Medicare Other | Attending: Neurosurgery | Admitting: Physical Therapy

## 2014-07-28 DIAGNOSIS — G8929 Other chronic pain: Secondary | ICD-10-CM | POA: Diagnosis not present

## 2014-07-28 DIAGNOSIS — R29898 Other symptoms and signs involving the musculoskeletal system: Secondary | ICD-10-CM | POA: Insufficient documentation

## 2014-07-28 DIAGNOSIS — M542 Cervicalgia: Secondary | ICD-10-CM | POA: Insufficient documentation
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
## 2014-07-30 ENCOUNTER — Ambulatory Visit: Payer: Medicare Other | Admitting: Physical Therapy

## 2014-07-31 ENCOUNTER — Ambulatory Visit: Payer: Medicare Other | Admitting: Physical Therapy

## 2014-07-31 DIAGNOSIS — G8929 Other chronic pain: Secondary | ICD-10-CM

## 2014-07-31 DIAGNOSIS — M542 Cervicalgia: Secondary | ICD-10-CM

## 2014-07-31 DIAGNOSIS — R29898 Other symptoms and signs involving the musculoskeletal system: Secondary | ICD-10-CM
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
## 2014-07-31 NOTE — Patient Instructions (Addendum)
Chest Fly   KEEP CHIN TUCK WITH HEAD PRESSED INTO PILLOW FOR THE FOLLOWING EXERCISES Lie on back with knees bent, arms extended to sides and slightly bent, palms facing up. Inhale, then exhale while bringing weights together, arms straight. Hold 1 count. Slowly return to starting position. Repeat _10___ times per set. Do _2___ sets per session. Do __7__ sessions per week. May be done with dumbbells, tubing or resistive band. (YELLOW BAND)  Copyright  VHI. All rights reserved.      PNF Strengthening: Resisted   Laying with resistive band around each hand, bring right arm up and away, thumb back. Repeat _10-20___ times each side. Do ___2_ sets per session. Do 2____ sessions per day.  http://[email or web address].exer.us/918

## 2014-08-04 ENCOUNTER — Ambulatory Visit: Payer: Medicare Other | Admitting: Physical Therapy

## 2014-08-04 DIAGNOSIS — G8929 Other chronic pain: Secondary | ICD-10-CM

## 2014-08-04 DIAGNOSIS — M542 Cervicalgia: Secondary | ICD-10-CM | POA: Diagnosis not present

## 2014-08-04 DIAGNOSIS — R29898 Other symptoms and signs involving the musculoskeletal system: Secondary | ICD-10-CM

## 2014-08-04 NOTE — Patient Instructions (Signed)
Tennis ball for self care/release Towel for extension and asked patient to not "give into pain" by maintaining head in flexion.
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
## 2014-08-06 ENCOUNTER — Ambulatory Visit: Payer: Medicare Other | Admitting: Physical Therapy

## 2014-08-06 DIAGNOSIS — M542 Cervicalgia: Secondary | ICD-10-CM | POA: Diagnosis not present

## 2014-08-06 DIAGNOSIS — G8929 Other chronic pain: Secondary | ICD-10-CM

## 2014-08-06 DIAGNOSIS — R29898 Other symptoms and signs involving the musculoskeletal system: Secondary | ICD-10-CM
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
## 2014-08-11 ENCOUNTER — Ambulatory Visit: Payer: Medicare Other | Admitting: Physical Therapy

## 2014-08-13 ENCOUNTER — Ambulatory Visit: Payer: Medicare Other | Admitting: Physical Therapy

## 2014-08-13 DIAGNOSIS — M542 Cervicalgia: Secondary | ICD-10-CM

## 2014-08-13 DIAGNOSIS — G8929 Other chronic pain: Secondary | ICD-10-CM

## 2014-08-13 DIAGNOSIS — R29898 Other symptoms and signs involving the musculoskeletal system: Secondary | ICD-10-CM
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
## 2014-08-18 ENCOUNTER — Encounter: Payer: Medicare Other | Admitting: Physical Therapy

## 2014-08-19 ENCOUNTER — Ambulatory Visit: Payer: Medicare Other | Attending: Neurosurgery | Admitting: Physical Therapy

## 2014-08-19 DIAGNOSIS — G8929 Other chronic pain: Secondary | ICD-10-CM | POA: Insufficient documentation

## 2014-08-19 DIAGNOSIS — M542 Cervicalgia: Secondary | ICD-10-CM | POA: Diagnosis not present

## 2014-08-19 DIAGNOSIS — R29898 Other symptoms and signs involving the musculoskeletal system: Secondary | ICD-10-CM | POA: Diagnosis not present
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
## 2014-08-19 NOTE — Patient Instructions (Addendum)
Levator Stretch   Grasp seat or sit on hand on side to be stretched. Turn head toward other side and look down. Use hand on head to gently stretch neck in that position. Hold __30-60__ seconds. Repeat on other side. Repeat ___2-3_ times. Do __3__ sessions per day.  http://gt2.exer.us/30   Copyright  VHI. All rights reserved.  Side-Bending   One hand on opposite side of head, pull head to side as far as is comfortable. Stop if there is pain. Hold _30 60___ seconds. Repeat with other hand to other side. Repeat __2-3 times. Do ___3_ sessions per day.  Trigger Point Dry Needling  . What is Trigger Point Dry Needling (DN)? o DN is a physical therapy technique used to treat muscle pain and dysfunction. Specifically, DN helps deactivate muscle trigger points (muscle knots).  o A thin filiform needle is used to penetrate the skin and stimulate the underlying trigger point. The goal is for a local twitch response (LTR) to occur and for the trigger point to relax. No medication of any kind is injected during the procedure.   . What Does Trigger Point Dry Needling Feel Like?  o The procedure feels different for each individual patient. Some patients report that they do not actually feel the needle enter the skin and overall the process is not painful. Very mild bleeding may occur. However, many patients feel a deep cramping in the muscle in which the needle was inserted. This is the local twitch response.   Marland Kitchen How Will I feel after the treatment? o Soreness is normal, and the onset of soreness may not occur for a few hours. Typically this soreness does not last longer than two days.  o Bruising is uncommon, however; ice can be used to decrease any possible bruising.  o In rare cases feeling tired or nauseous after the treatment is normal. In addition, your symptoms may get worse before they get better, this period will typically not last longer than 24 hours.   . What Can I do After My  Treatment? o Increase your hydration by drinking more water for the next 24 hours. o You may place ice or heat on the areas treated that have become sore, however, do not use heat on inflamed or bruised areas. Heat often brings more relief post needling. o You can continue your regular activities, but vigorous activity is not recommended initially after the treatment for 24 hours. DN is best combined with other physical therapy such as strengthening, stretching, and other therapies.   Copyright  VHI. All rights reserved.  Scapular Retraction (Standing) Voncille Lo, PT 08/19/2014 8:54 AM Phone: [phone] Fax: [phone]

## 2014-08-20 ENCOUNTER — Ambulatory Visit: Payer: Medicare Other | Admitting: Physical Therapy

## 2014-08-20 DIAGNOSIS — M542 Cervicalgia: Secondary | ICD-10-CM

## 2014-08-20 DIAGNOSIS — R29898 Other symptoms and signs involving the musculoskeletal system: Secondary | ICD-10-CM

## 2014-08-20 DIAGNOSIS — G8929 Other chronic pain: Secondary | ICD-10-CM
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
## 2014-08-25 ENCOUNTER — Ambulatory Visit: Payer: Medicare Other | Admitting: Physical Therapy

## 2014-08-25 DIAGNOSIS — G8929 Other chronic pain: Secondary | ICD-10-CM

## 2014-08-25 DIAGNOSIS — R29898 Other symptoms and signs involving the musculoskeletal system: Secondary | ICD-10-CM

## 2014-08-25 DIAGNOSIS — M542 Cervicalgia: Secondary | ICD-10-CM | POA: Diagnosis not present
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
## 2014-08-25 NOTE — Patient Instructions (Signed)
Pt given I Y T exercise sheet in prone.  10 x each as shown in clinic and given on handout  10 x bilaterally  For I , Y and T position.  Voncille Lo, PT 08/25/2014 11:59 AM Phone: [phone] Fax: [phone]

## 2014-08-27 ENCOUNTER — Ambulatory Visit: Payer: Medicare Other | Admitting: Physical Therapy

## 2014-09-02 ENCOUNTER — Other Ambulatory Visit: Payer: Self-pay | Admitting: Cardiology

## 2014-12-05 ENCOUNTER — Other Ambulatory Visit: Payer: Self-pay | Admitting: *Deleted

## 2014-12-05 MED ORDER — CLOPIDOGREL BISULFATE 75 MG PO TABS
ORAL_TABLET | ORAL | Status: DC
Start: 2014-12-05 — End: 2020-11-11

## 2014-12-05 MED ORDER — PRAVASTATIN SODIUM 80 MG PO TABS: ORAL_TABLET | ORAL | Status: DC

## 2015-12-24 ENCOUNTER — Other Ambulatory Visit: Payer: Self-pay | Admitting: Cardiology

## 2015-12-24 NOTE — Telephone Encounter (Signed)
Needs followup to get meds.

## 2015-12-28 NOTE — Telephone Encounter (Signed)
He needs to schedule an appointment so that we can continue to refill his medication, appt can be with PA/NP.

## 2016-11-03 ENCOUNTER — Emergency Department (HOSPITAL_COMMUNITY)
Admission: EM | Admit: 2016-11-03 | Discharge: 2016-11-03 | Payer: Medicare Other | Attending: Emergency Medicine | Admitting: Emergency Medicine

## 2016-11-03 ENCOUNTER — Encounter (HOSPITAL_COMMUNITY): Payer: Self-pay

## 2016-11-03 DIAGNOSIS — X58XXXA Exposure to other specified factors, initial encounter: Secondary | ICD-10-CM | POA: Diagnosis not present

## 2016-11-03 DIAGNOSIS — I10 Essential (primary) hypertension: Secondary | ICD-10-CM | POA: Insufficient documentation

## 2016-11-03 DIAGNOSIS — R456 Violent behavior: Secondary | ICD-10-CM | POA: Diagnosis not present

## 2016-11-03 DIAGNOSIS — F1010 Alcohol abuse, uncomplicated: Secondary | ICD-10-CM | POA: Insufficient documentation

## 2016-11-03 DIAGNOSIS — S61511A Laceration without foreign body of right wrist, initial encounter: Secondary | ICD-10-CM | POA: Insufficient documentation

## 2016-11-03 DIAGNOSIS — Z79899 Other long term (current) drug therapy: Secondary | ICD-10-CM | POA: Diagnosis not present

## 2016-11-03 DIAGNOSIS — T148XXA Other injury of unspecified body region, initial encounter: Secondary | ICD-10-CM

## 2016-11-03 DIAGNOSIS — I509 Heart failure, unspecified: Secondary | ICD-10-CM | POA: Diagnosis not present

## 2016-11-03 DIAGNOSIS — Z87891 Personal history of nicotine dependence: Secondary | ICD-10-CM | POA: Insufficient documentation

## 2016-11-03 DIAGNOSIS — Y929 Unspecified place or not applicable: Secondary | ICD-10-CM | POA: Insufficient documentation

## 2016-11-03 DIAGNOSIS — S61512A Laceration without foreign body of left wrist, initial encounter: Secondary | ICD-10-CM | POA: Insufficient documentation

## 2016-11-03 DIAGNOSIS — Y939 Activity, unspecified: Secondary | ICD-10-CM | POA: Diagnosis not present

## 2016-11-03 DIAGNOSIS — Y999 Unspecified external cause status: Secondary | ICD-10-CM | POA: Insufficient documentation

## 2016-11-03 NOTE — ED Notes (Signed)
Patient discharged and ambulated from the ED in [employer] custody in [employer] handcuffs. Patient screaming obscenities the entire time. [employer] provided with patient discharge paperwork and instructions.

## 2016-11-03 NOTE — Discharge Instructions (Signed)
Wash the wounds daily. Change bandages daily.

## 2016-11-03 NOTE — ED Notes (Signed)
Bed: ID02 Expected date:  Expected time:  Means of arrival:  Comments: [employer]-laceration
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
## 2016-11-03 NOTE — ED Notes (Addendum)
EDPA and EDMD at bedside to assess patient wounds. Patient remains screaming, verbally abusive, and combative. Patient refuses all offers of help from medical staff and remains uncooperative during assessment. Patient refuses treatment of his wounds. Patient remains in [employer] custody and in [employer] metal handcuffs at this time for patient and staff safety. Patient wallet and belongings provided to [facility] officer D.P. Counts.

## 2019-05-21 ENCOUNTER — Emergency Department (HOSPITAL_COMMUNITY)
Admission: EM | Admit: 2019-05-21 | Discharge: 2019-05-21 | Discharge status: Against Medical Advice | Payer: Medicare Other | Source: Home / Self Care

## 2019-05-21 ENCOUNTER — Encounter (HOSPITAL_COMMUNITY): Payer: Self-pay | Admitting: Emergency Medicine

## 2019-05-21 ENCOUNTER — Other Ambulatory Visit: Payer: Self-pay

## 2019-05-21 DIAGNOSIS — N179 Acute kidney failure, unspecified: Secondary | ICD-10-CM | POA: Diagnosis not present

## 2019-05-21 DIAGNOSIS — A419 Sepsis, unspecified organism: Secondary | ICD-10-CM | POA: Diagnosis not present

## 2019-05-21 LAB — BASIC METABOLIC PANEL
Anion gap: 18 — ABNORMAL HIGH (ref 5–15)
BUN: 14 mg/dL (ref 8–23)
CO2: 21 mmol/L — ABNORMAL LOW (ref 22–32)
Calcium: 9.6 mg/dL (ref 8.9–10.3)
Chloride: 97 mmol/L — ABNORMAL LOW (ref 98–111)
Creatinine, Ser: 0.94 mg/dL (ref 0.61–1.24)
GFR calc Af Amer: >60 mL/min (ref >60)
GFR calc non Af Amer: >60 mL/min (ref >60)
Glucose, Bld: 104 mg/dL — ABNORMAL HIGH (ref 70–99)
Potassium: 3.7 mmol/L (ref 3.5–5.1)
Sodium: 136 mmol/L (ref 135–145)

## 2019-05-21 LAB — CBC
HCT: 45.5 % (ref 39.0–52.0)
Hemoglobin: 15.6 g/dL (ref 13.0–17.0)
MCH: 33.7 pg (ref 26.0–34.0)
MCHC: 34.3 g/dL (ref 30.0–36.0)
MCV: 98.3 fL (ref 80.0–100.0)
Platelets: 360 10*3/uL (ref 150–400)
RBC: 4.63 MIL/uL (ref 4.22–5.81)
RDW: 14.7 % (ref 11.5–15.5)
WBC: 12.1 10*3/uL — ABNORMAL HIGH (ref 4.0–10.5)
nRBC: 0 % (ref 0.0–0.2)

## 2019-05-21 MED ORDER — SODIUM CHLORIDE 0.9% FLUSH: 3.0000 mL | Freq: Once | INTRAVENOUS | Status: DC

## 2019-05-21 NOTE — ED Triage Notes (Signed)
Pt to triage via [facility] for generalized weakness.  Reports drinking a bottle of wine today.  States he doesn't feel good.  Reports chronic neck pain.

## 2019-05-21 NOTE — ED Notes (Signed)
Pt eloped without telling any staff.

## 2019-05-23 ENCOUNTER — Encounter (HOSPITAL_COMMUNITY): Payer: Self-pay | Admitting: Emergency Medicine

## 2019-05-23 ENCOUNTER — Emergency Department (HOSPITAL_COMMUNITY): Payer: Medicare Other

## 2019-05-23 ENCOUNTER — Inpatient Hospital Stay (HOSPITAL_COMMUNITY)
Admission: EM | Admit: 2019-05-23 | Discharge: 2019-05-26 | DRG: 683 | Disposition: A | Discharge status: Home / Self Care | Payer: Medicare Other | Attending: Internal Medicine | Admitting: Internal Medicine

## 2019-05-23 ENCOUNTER — Other Ambulatory Visit: Payer: Self-pay

## 2019-05-23 DIAGNOSIS — G8929 Other chronic pain: Secondary | ICD-10-CM | POA: Diagnosis present

## 2019-05-23 DIAGNOSIS — I9589 Other hypotension: Secondary | ICD-10-CM | POA: Diagnosis not present

## 2019-05-23 DIAGNOSIS — N179 Acute kidney failure, unspecified: Secondary | ICD-10-CM | POA: Diagnosis present

## 2019-05-23 DIAGNOSIS — T464X5A Adverse effect of angiotensin-converting-enzyme inhibitors, initial encounter: Secondary | ICD-10-CM | POA: Diagnosis present

## 2019-05-23 DIAGNOSIS — I959 Hypotension, unspecified: Secondary | ICD-10-CM | POA: Diagnosis present

## 2019-05-23 DIAGNOSIS — Z7984 Long term (current) use of oral hypoglycemic drugs: Secondary | ICD-10-CM | POA: Diagnosis not present

## 2019-05-23 DIAGNOSIS — R197 Diarrhea, unspecified: Secondary | ICD-10-CM | POA: Diagnosis present

## 2019-05-23 DIAGNOSIS — K219 Gastro-esophageal reflux disease without esophagitis: Secondary | ICD-10-CM | POA: Diagnosis present

## 2019-05-23 DIAGNOSIS — T383X5A Adverse effect of insulin and oral hypoglycemic [antidiabetic] drugs, initial encounter: Secondary | ICD-10-CM | POA: Diagnosis present

## 2019-05-23 DIAGNOSIS — E860 Dehydration: Secondary | ICD-10-CM | POA: Diagnosis present

## 2019-05-23 DIAGNOSIS — Z20822 Contact with and (suspected) exposure to covid-19: Secondary | ICD-10-CM | POA: Diagnosis present

## 2019-05-23 DIAGNOSIS — E119 Type 2 diabetes mellitus without complications: Secondary | ICD-10-CM | POA: Diagnosis present

## 2019-05-23 DIAGNOSIS — R778 Other specified abnormalities of plasma proteins: Secondary | ICD-10-CM | POA: Diagnosis present

## 2019-05-23 DIAGNOSIS — Z8679 Personal history of other diseases of the circulatory system: Secondary | ICD-10-CM

## 2019-05-23 DIAGNOSIS — E861 Hypovolemia: Secondary | ICD-10-CM | POA: Diagnosis not present

## 2019-05-23 DIAGNOSIS — E872 Acidosis: Secondary | ICD-10-CM | POA: Diagnosis present

## 2019-05-23 DIAGNOSIS — E785 Hyperlipidemia, unspecified: Secondary | ICD-10-CM | POA: Diagnosis present

## 2019-05-23 DIAGNOSIS — Z833 Family history of diabetes mellitus: Secondary | ICD-10-CM | POA: Diagnosis not present

## 2019-05-23 DIAGNOSIS — N400 Enlarged prostate without lower urinary tract symptoms: Secondary | ICD-10-CM | POA: Diagnosis present

## 2019-05-23 DIAGNOSIS — Z7902 Long term (current) use of antithrombotics/antiplatelets: Secondary | ICD-10-CM

## 2019-05-23 DIAGNOSIS — F1010 Alcohol abuse, uncomplicated: Secondary | ICD-10-CM | POA: Diagnosis present

## 2019-05-23 DIAGNOSIS — Z79891 Long term (current) use of opiate analgesic: Secondary | ICD-10-CM

## 2019-05-23 DIAGNOSIS — T502X5A Adverse effect of carbonic-anhydrase inhibitors, benzothiadiazides and other diuretics, initial encounter: Secondary | ICD-10-CM | POA: Diagnosis present

## 2019-05-23 DIAGNOSIS — I10 Essential (primary) hypertension: Secondary | ICD-10-CM | POA: Diagnosis present

## 2019-05-23 DIAGNOSIS — A419 Sepsis, unspecified organism: Secondary | ICD-10-CM | POA: Diagnosis present

## 2019-05-23 DIAGNOSIS — I351 Nonrheumatic aortic (valve) insufficiency: Secondary | ICD-10-CM | POA: Diagnosis not present

## 2019-05-23 DIAGNOSIS — Z825 Family history of asthma and other chronic lower respiratory diseases: Secondary | ICD-10-CM

## 2019-05-23 DIAGNOSIS — Z8673 Personal history of transient ischemic attack (TIA), and cerebral infarction without residual deficits: Secondary | ICD-10-CM | POA: Diagnosis not present

## 2019-05-23 DIAGNOSIS — M542 Cervicalgia: Secondary | ICD-10-CM | POA: Diagnosis present

## 2019-05-23 DIAGNOSIS — R55 Syncope and collapse: Secondary | ICD-10-CM | POA: Diagnosis present

## 2019-05-23 DIAGNOSIS — Z85828 Personal history of other malignant neoplasm of skin: Secondary | ICD-10-CM | POA: Diagnosis not present

## 2019-05-23 DIAGNOSIS — Z87891 Personal history of nicotine dependence: Secondary | ICD-10-CM

## 2019-05-23 DIAGNOSIS — D72829 Elevated white blood cell count, unspecified: Secondary | ICD-10-CM | POA: Diagnosis present

## 2019-05-23 DIAGNOSIS — I509 Heart failure, unspecified: Secondary | ICD-10-CM

## 2019-05-23 LAB — COMPREHENSIVE METABOLIC PANEL
ALT: 15 U/L (ref 0–44)
AST: 29 U/L (ref 15–41)
Albumin: 3.7 g/dL (ref 3.5–5.0)
Alkaline Phosphatase: 88 U/L (ref 38–126)
Anion gap: 18 — ABNORMAL HIGH (ref 5–15)
BUN: 21 mg/dL (ref 8–23)
CO2: 21 mmol/L — ABNORMAL LOW (ref 22–32)
Calcium: 9.3 mg/dL (ref 8.9–10.3)
Chloride: 99 mmol/L (ref 98–111)
Creatinine, Ser: 2.06 mg/dL — ABNORMAL HIGH (ref 0.61–1.24)
GFR calc Af Amer: 37 mL/min — ABNORMAL LOW (ref >60)
GFR calc non Af Amer: 32 mL/min — ABNORMAL LOW (ref >60)
Glucose, Bld: 144 mg/dL — ABNORMAL HIGH (ref 70–99)
Potassium: 4.5 mmol/L (ref 3.5–5.1)
Sodium: 138 mmol/L (ref 135–145)
Total Bilirubin: 0.8 mg/dL (ref 0.3–1.2)
Total Protein: 7.2 g/dL (ref 6.5–8.1)

## 2019-05-23 LAB — TROPONIN I (HIGH SENSITIVITY)
Troponin I (High Sensitivity): 132 ng/L (ref <18)
Troponin I (High Sensitivity): 173 ng/L (ref <18)

## 2019-05-23 LAB — CBC
HCT: 45.4 % (ref 39.0–52.0)
Hemoglobin: 15.5 g/dL (ref 13.0–17.0)
MCH: 34.2 pg — ABNORMAL HIGH (ref 26.0–34.0)
MCHC: 34.1 g/dL (ref 30.0–36.0)
MCV: 100.2 fL — ABNORMAL HIGH (ref 80.0–100.0)
Platelets: 333 10*3/uL (ref 150–400)
RBC: 4.53 MIL/uL (ref 4.22–5.81)
RDW: 15 % (ref 11.5–15.5)
WBC: 16.1 10*3/uL — ABNORMAL HIGH (ref 4.0–10.5)
nRBC: 0 % (ref 0.0–0.2)

## 2019-05-23 LAB — URINALYSIS, ROUTINE W REFLEX MICROSCOPIC
Bilirubin Urine: NEGATIVE
Glucose, UA: NEGATIVE mg/dL
Hgb urine dipstick: NEGATIVE
Ketones, ur: 5 mg/dL — AB
Leukocytes,Ua: NEGATIVE
Nitrite: NEGATIVE
Protein, ur: NEGATIVE mg/dL
Specific Gravity, Urine: 1.009 (ref 1.005–1.030)
pH: 5 (ref 5.0–8.0)

## 2019-05-23 LAB — LACTIC ACID, PLASMA
Lactic Acid, Venous: 4.6 mmol/L (ref 0.5–1.9)
Lactic Acid, Venous: 2.9 mmol/L (ref 0.5–1.9)

## 2019-05-23 MED ORDER — SODIUM CHLORIDE 0.9 % IV SOLN
2.0000 g | Freq: Once | INTRAVENOUS | Status: AC
  Administered 2019-05-23: 21:00:00 2 g via INTRAVENOUS
  Filled 2019-05-23: qty 2

## 2019-05-23 MED ORDER — IOHEXOL 9 MG/ML PO SOLN
ORAL | Status: AC
  Filled 2019-05-23: qty 1000

## 2019-05-23 MED ORDER — LACTATED RINGERS IV BOLUS (SEPSIS)
1000.0000 mL | Freq: Once | INTRAVENOUS | Status: AC
  Administered 2019-05-23: 21:00:00 1000 mL via INTRAVENOUS

## 2019-05-23 MED ORDER — IOHEXOL 9 MG/ML PO SOLN
500.0000 mL | ORAL | Status: AC
  Administered 2019-05-23: 500 mL via ORAL

## 2019-05-23 MED ORDER — ACETAMINOPHEN 325 MG PO TABS: 650.0000 mg | ORAL_TABLET | Freq: Four times a day (QID) | ORAL | Status: DC | PRN

## 2019-05-23 MED ORDER — SODIUM CHLORIDE 0.9 % IV SOLN
1000.0000 mL | INTRAVENOUS | Status: DC
  Administered 2019-05-23: 20:00:00 1000 mL via INTRAVENOUS

## 2019-05-23 MED ORDER — SODIUM CHLORIDE 0.9 % IV SOLN: INTRAVENOUS | Status: AC

## 2019-05-23 MED ORDER — METRONIDAZOLE IN NACL 5-0.79 MG/ML-% IV SOLN
500.0000 mg | Freq: Once | INTRAVENOUS | Status: AC
  Administered 2019-05-23: 500 mg via INTRAVENOUS
  Filled 2019-05-23: qty 100

## 2019-05-23 MED ORDER — LACTATED RINGERS IV BOLUS (SEPSIS)
1000.0000 mL | Freq: Once | INTRAVENOUS | Status: AC
  Administered 2019-05-23: 20:00:00 1000 mL via INTRAVENOUS

## 2019-05-23 MED ORDER — ACETAMINOPHEN 650 MG RE SUPP: 650.0000 mg | Freq: Four times a day (QID) | RECTAL | Status: DC | PRN

## 2019-05-23 MED ORDER — VANCOMYCIN HCL IN DEXTROSE 1-5 GM/200ML-% IV SOLN
1000.0000 mg | Freq: Once | INTRAVENOUS | Status: AC
  Administered 2019-05-23: 1000 mg via INTRAVENOUS
  Filled 2019-05-23: qty 200

## 2019-05-23 MED ORDER — OXYCODONE HCL 5 MG PO TABS
5.0000 mg | ORAL_TABLET | ORAL | Status: DC | PRN
  Administered 2019-05-24 – 2019-05-25 (×4): 5 mg via ORAL
  Filled 2019-05-23 (×4): qty 1

## 2019-05-23 MED ORDER — ENOXAPARIN SODIUM 30 MG/0.3ML ~~LOC~~ SOLN
30.0000 mg | Freq: Every day | SUBCUTANEOUS | Status: DC
  Administered 2019-05-23: 23:00:00 30 mg via SUBCUTANEOUS
  Filled 2019-05-23: qty 0.3

## 2019-05-23 MED ORDER — SODIUM CHLORIDE 0.9 % IV BOLUS (SEPSIS)
1000.0000 mL | Freq: Once | INTRAVENOUS | Status: AC
  Administered 2019-05-23: 18:00:00 1000 mL via INTRAVENOUS

## 2019-05-23 NOTE — ED Notes (Addendum)
CT brought contrast for pt to drink.

## 2019-05-23 NOTE — ED Notes (Signed)
Obtained 2nd set of blood cultures from right arm.

## 2019-05-23 NOTE — ED Notes (Addendum)
Date and time results received: 05/23/19 1943 (use smartphrase ".now" to insert current time)  Test: Lactic Acid  Critical Value: 4.6  Name of Provider Notified: knapp md  Orders Received? Or Actions Taken?: waiting on orders

## 2019-05-23 NOTE — Progress Notes (Signed)
A consult was received from an ED physician for Vancomycin & Cefepime per pharmacy dosing.  The patient's profile has been reviewed for ht/wt/allergies/indication/available labs.   A one time order has been placed for Vancomycin 1gm & Cefepime 2gm.  Further antibiotics/pharmacy consults should be ordered by admitting physician if indicated.                       Thank you, Netta Cedars, PharmD, BCPS 05/23/2019  8:13 PM

## 2019-05-23 NOTE — ED Notes (Addendum)
Date and time results received: 05/23/19 1943 (use smartphrase ".now" to insert current time)  Test: troponin Critical Value: 132 Name of Provider Notified: knapp md Orders Received? Or Actions Taken?: waiting on orders

## 2019-05-23 NOTE — Progress Notes (Signed)
Notified bedside nurse of need to draw lactic acid.  
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
## 2019-05-24 ENCOUNTER — Inpatient Hospital Stay (HOSPITAL_COMMUNITY): Payer: Medicare Other

## 2019-05-24 DIAGNOSIS — R778 Other specified abnormalities of plasma proteins: Secondary | ICD-10-CM

## 2019-05-24 DIAGNOSIS — N179 Acute kidney failure, unspecified: Principal | ICD-10-CM

## 2019-05-24 DIAGNOSIS — I351 Nonrheumatic aortic (valve) insufficiency: Secondary | ICD-10-CM

## 2019-05-24 DIAGNOSIS — I9589 Other hypotension: Secondary | ICD-10-CM

## 2019-05-24 DIAGNOSIS — E861 Hypovolemia: Secondary | ICD-10-CM

## 2019-05-24 DIAGNOSIS — A419 Sepsis, unspecified organism: Secondary | ICD-10-CM

## 2019-05-24 LAB — CBC
HCT: 39.2 % (ref 39.0–52.0)
Hemoglobin: 12.9 g/dL — ABNORMAL LOW (ref 13.0–17.0)
MCH: 33.8 pg (ref 26.0–34.0)
MCHC: 32.9 g/dL (ref 30.0–36.0)
MCV: 102.6 fL — ABNORMAL HIGH (ref 80.0–100.0)
Platelets: 210 10*3/uL (ref 150–400)
RBC: 3.82 MIL/uL — ABNORMAL LOW (ref 4.22–5.81)
RDW: 15.2 % (ref 11.5–15.5)
WBC: 9.3 10*3/uL (ref 4.0–10.5)
nRBC: 0 % (ref 0.0–0.2)

## 2019-05-24 LAB — COMPREHENSIVE METABOLIC PANEL
ALT: 10 U/L (ref 0–44)
AST: 19 U/L (ref 15–41)
Albumin: 3 g/dL — ABNORMAL LOW (ref 3.5–5.0)
Alkaline Phosphatase: 70 U/L (ref 38–126)
Anion gap: 11 (ref 5–15)
BUN: 19 mg/dL (ref 8–23)
CO2: 22 mmol/L (ref 22–32)
Calcium: 8.5 mg/dL — ABNORMAL LOW (ref 8.9–10.3)
Chloride: 104 mmol/L (ref 98–111)
Creatinine, Ser: 1.19 mg/dL (ref 0.61–1.24)
GFR calc Af Amer: >60 mL/min (ref >60)
GFR calc non Af Amer: >60 mL/min (ref >60)
Glucose, Bld: 79 mg/dL (ref 70–99)
Potassium: 3.5 mmol/L (ref 3.5–5.1)
Sodium: 137 mmol/L (ref 135–145)
Total Bilirubin: 0.9 mg/dL (ref 0.3–1.2)
Total Protein: 5.6 g/dL — ABNORMAL LOW (ref 6.5–8.1)

## 2019-05-24 LAB — MRSA PCR SCREENING: MRSA by PCR: NEGATIVE

## 2019-05-24 LAB — HIV ANTIBODY (ROUTINE TESTING W REFLEX): HIV Screen 4th Generation wRfx: NONREACTIVE

## 2019-05-24 LAB — RESPIRATORY PANEL BY RT PCR (FLU A&B, COVID)
Influenza A by PCR: NEGATIVE
Influenza B by PCR: NEGATIVE
SARS Coronavirus 2 by RT PCR: NEGATIVE

## 2019-05-24 LAB — TROPONIN I (HIGH SENSITIVITY)
Troponin I (High Sensitivity): 140 ng/L (ref <18)
Troponin I (High Sensitivity): 66 ng/L — ABNORMAL HIGH (ref <18)
Troponin I (High Sensitivity): 65 ng/L — ABNORMAL HIGH (ref <18)

## 2019-05-24 LAB — ECHOCARDIOGRAM COMPLETE
Height: 68 in
Weight: 2201.07 oz

## 2019-05-24 LAB — GLUCOSE, CAPILLARY
Glucose-Capillary: 136 mg/dL — ABNORMAL HIGH (ref 70–99)
Glucose-Capillary: 119 mg/dL — ABNORMAL HIGH (ref 70–99)

## 2019-05-24 LAB — CORTISOL: Cortisol, Plasma: 6.6 ug/dL

## 2019-05-24 LAB — LACTIC ACID, PLASMA: Lactic Acid, Venous: 1.9 mmol/L (ref 0.5–1.9)

## 2019-05-24 MED ORDER — INSULIN ASPART 100 UNIT/ML ~~LOC~~ SOLN
0.0000 [IU] | Freq: Three times a day (TID) | SUBCUTANEOUS | Status: DC
  Administered 2019-05-24 – 2019-05-25 (×2): 1 [IU] via SUBCUTANEOUS

## 2019-05-24 MED ORDER — CHLORHEXIDINE GLUCONATE CLOTH 2 % EX PADS: 6.0000 | MEDICATED_PAD | Freq: Every day | CUTANEOUS | Status: DC

## 2019-05-24 MED ORDER — ENOXAPARIN SODIUM 40 MG/0.4ML ~~LOC~~ SOLN
40.0000 mg | Freq: Every day | SUBCUTANEOUS | Status: DC
  Administered 2019-05-24 – 2019-05-25 (×2): 40 mg via SUBCUTANEOUS
  Filled 2019-05-24 (×2): qty 0.4

## 2019-05-24 MED ORDER — PRAVASTATIN SODIUM 40 MG PO TABS
80.0000 mg | ORAL_TABLET | Freq: Every day | ORAL | Status: DC
  Administered 2019-05-24 – 2019-05-25 (×2): 80 mg via ORAL
  Filled 2019-05-24: qty 2
  Filled 2019-05-24: qty 4

## 2019-05-24 MED ORDER — VANCOMYCIN HCL IN DEXTROSE 1-5 GM/200ML-% IV SOLN: 1000.0000 mg | INTRAVENOUS | Status: DC

## 2019-05-24 MED ORDER — CLOPIDOGREL BISULFATE 75 MG PO TABS
75.0000 mg | ORAL_TABLET | Freq: Every day | ORAL | Status: DC
  Administered 2019-05-24 – 2019-05-26 (×3): 75 mg via ORAL
  Filled 2019-05-24 (×3): qty 1

## 2019-05-24 MED ORDER — TAMSULOSIN HCL 0.4 MG PO CAPS
0.4000 mg | ORAL_CAPSULE | Freq: Every day | ORAL | Status: DC
  Administered 2019-05-24 – 2019-05-26 (×3): 0.4 mg via ORAL
  Filled 2019-05-24 (×3): qty 1

## 2019-05-24 MED ORDER — FINASTERIDE 5 MG PO TABS
5.0000 mg | ORAL_TABLET | Freq: Every day | ORAL | Status: DC
  Administered 2019-05-24 – 2019-05-26 (×3): 5 mg via ORAL
  Filled 2019-05-24 (×3): qty 1

## 2019-05-24 MED ORDER — SODIUM CHLORIDE 0.9 % IV SOLN
2.0000 g | Freq: Two times a day (BID) | INTRAVENOUS | Status: DC
  Administered 2019-05-24 (×2): 2 g via INTRAVENOUS
  Filled 2019-05-24 (×2): qty 2

## 2019-05-24 MED ORDER — GABAPENTIN 300 MG PO CAPS
300.0000 mg | ORAL_CAPSULE | Freq: Two times a day (BID) | ORAL | Status: DC
  Administered 2019-05-24 – 2019-05-26 (×6): 300 mg via ORAL
  Filled 2019-05-24 (×6): qty 1

## 2019-05-24 MED ORDER — VANCOMYCIN HCL IN DEXTROSE 1-5 GM/200ML-% IV SOLN
1000.0000 mg | INTRAVENOUS | Status: DC
  Administered 2019-05-24: 1000 mg via INTRAVENOUS

## 2019-05-24 MED ORDER — SODIUM CHLORIDE 0.9 % IV SOLN: 2.0000 g | INTRAVENOUS | Status: DC

## 2019-05-24 MED ORDER — TIZANIDINE HCL 4 MG PO TABS
4.0000 mg | ORAL_TABLET | Freq: Four times a day (QID) | ORAL | Status: DC | PRN
  Administered 2019-05-24 – 2019-05-26 (×2): 4 mg via ORAL
  Filled 2019-05-24 (×2): qty 1

## 2019-05-24 NOTE — Plan of Care (Signed)
Pt alert and oriented able to express needs. Pain 2/10 chronic neck pain hot packs given to alleviate discomfort. Continent of bowel and bladder. Stool sample to be collected today when available. Will use x 2 assist to restroom on initial visit and reevaluate needs for safety. Cardiac diet with good appetite ate 100% of breakfast this AM. Pt has informed writer that he has some short term memory loss as residual effect from stroke in 2012 but mobility and ROM are unaffected. Able to turn self in bed no skin issues. Med/diet compliant. Plan of care discussed with patient and he verbalizes understanding. Will continue to monitor and make changes as needed

## 2019-05-24 NOTE — ED Notes (Signed)
Pt transported to CT ?

## 2019-05-24 NOTE — Progress Notes (Signed)
Pharmacy Antibiotic Note  Austin Andrade is a 69 y.o. male admitted on 05/23/2019 with sepsis.  Pharmacy has been consulted for cefepime and vancomycin dosing.  Plan: For improved renal function increase Cefepime 2 gm IV to q12h  Change Vancomycin 1 Gm IV q24h for est AUC = 474, Scr = 1.2 F/u cultures/levels  Height: 5\' 8"  (172.7 cm) Weight: 137 lb 9.1 oz (62.4 kg) IBW/kg (Calculated) : 68.4  Temp (24hrs), Avg:97.7 F (36.5 C), Min:97.6 F (36.4 C), Max:97.8 F (36.6 C)  Recent Labs  Lab 05/21/19 1718 05/23/19 1753 05/23/19 2058 05/24/19 0637  WBC 12.1* 16.1*  --  9.3  CREATININE 0.94 2.06*  --  1.19  LATICACIDVEN  --  4.6* 2.9*  --     Estimated Creatinine Clearance: 51.7 mL/min (by C-G formula based on SCr of 1.19 mg/dL).    No Known Allergies  Antimicrobials this admission: 2/4 cefepime >>  2/4 vancomycin >>   Dose adjustments this admission:   Microbiology results:  BCx:   UCx:    Sputum:    2/5 MRSA PCR: neg  Thank you for allowing pharmacy to be a part of this patient's care.  Dolly Rias RPh 05/24/2019, 11:16 AM

## 2019-05-24 NOTE — Progress Notes (Signed)
Echocardiogram 2D Echocardiogram has been performed.  Oneal Deputy Arian Murley 05/24/2019, 3:17 PM

## 2019-05-24 NOTE — Progress Notes (Signed)
Pharmacy Antibiotic Note  Gregoire Favinger is a 69 y.o. male admitted on 05/23/2019 with sepsis.  Pharmacy has been consulted for cefepime and vancomycin dosing.  Plan: Cefepime 2 gm IV q24h  Vancomycin 1 Gm IV q36h for est AUC = 507 Use Scr = 2.06 Goal AUC = 400-550 F/u cultures/levels  Height: 5\' 8"  (172.7 cm) Weight: 127 lb 8 oz (57.8 kg) IBW/kg (Calculated) : 68.4  Temp (24hrs), Avg:97.8 F (36.6 C), Min:97.8 F (36.6 C), Max:97.8 F (36.6 C)  Recent Labs  Lab 05/21/19 1718 05/23/19 1753 05/23/19 2058  WBC 12.1* 16.1*  --   CREATININE 0.94 2.06*  --   LATICACIDVEN  --  4.6* 2.9*    Estimated Creatinine Clearance: 27.7 mL/min (A) (by C-G formula based on SCr of 2.06 mg/dL (H)).    No Known Allergies  Antimicrobials this admission: 2/4 cefepime >>  2/4 vancomycin >>   Dose adjustments this admission:   Microbiology results:  BCx:   UCx:    Sputum:    MRSA PCR:   Thank you for allowing pharmacy to be a part of this patient's care.  Dorrene German 05/24/2019 12:06 AM

## 2019-05-24 NOTE — Progress Notes (Signed)
PROGRESS NOTE  Austin Andrade [id number] DOB: [birth year] DOA: 05/23/2019 PCP: Sandi Mariscal, MD  HPI/Recap of past 82 hours: 69 year old male with history of hypertension, hyperlipidemia, CHF, stroke, alcohol abuse, presents to the ED complaining of near syncope likely 2/2 significant weakness.  Patient reports ongoing poor oral intake due to inability to prepare his meals as well as poor appetite.  Patient also reports loose stools about 1-2 daily which is chronic as per patient.  Patient continues to complain of chronic neck pain of which he receives oxycodone, but reports finishing his supply way before he is due for refill, hence taking Aleve multiple times per day.  In the ED, patient was noted to be hypotensive, saturating well on room air.  Labs showed WBC 16.1, hemoglobin 15.5, BUN 21, creatinine 2.06, lactic acid 4.6--> 2.9, flat trend of troponin although minimally elevated, EKG with normal sinus rhythm, CT abdomen pelvis, unremarkable.  Patient admitted for further management.    Today, patient reports feeling slightly better, denies any new complaints, denies any chest pain, shortness of breath, abdominal pain, dysuria, cough, fever/chills.  Ate all of his breakfast.  Has had just 1 loose BM since he has been in the unit.  Assessment/Plan: Principal Problem:   Sepsis ([place]) Active Problems:   Hypertension   CHF (congestive heart failure) ([place])   ARF (acute renal failure) ([place])   ??Sepsis Unclear etiology Hypotensive, leukocytosis, lactic acidosis on presentation (??significant dehydration) Currently afebrile, with no leukocytosis Respiratory panel including Covid negative LA 4.6--> 2.9, will trend BCX2 pending UA negative for infection, but shows some ketones Chest x-ray unremarkable CT abdomen/pelvis showed cholelithiasis without complicating factors, diverticulosis, and a possible hyperdense hemorrhagic cysts in the left kidney measuring approximately 11 mm Status post IV  fluid boluses Started on Vanco, cefepime, DC Vanco as MRSA PCR is negative, continue cefepime for now, pending blood cultures Monitor closely  AKI/dehydration Likely 2/2 poor oral intake, BP meds, NSAIDs Improved status post IV fluids Hold lisinopril, hydrochlorothiazide, Metformin Daily BMP  Elevated troponin Chest pain-free Flat trend: 132->173->140, will trend EKG with no acute ST changes Echo done in 2015 with normal EF, repeat pending  ?Chronic diarrhea CT abdomen/pelvis unremarkable GI panel pending  Diabetes mellitus type 2 Appears to be controlled A1c pending SSI, Accu-Cheks, hypoglycemic protocol  ??CHF Appears dehydrated Echo done on 01/2014 showed normal EF with mild AI and MR, repeat pending as mentioned above  History of CVA/hyperlipidemia Continue Plavix, statins  BPH Continue Flomax  Chronic neck pain Continue oxycodone as needed, gabapentin at a lower dose for now        Malnutrition Type:      Malnutrition Characteristics:      Nutrition Interventions:       Estimated body mass index is 20.92 kg/m as calculated from the following:   Height as of this encounter: 5\' 8"  (1.727 m).   Weight as of this encounter: 62.4 kg.     Code Status: Full  Family Communication: None at bedside  Disposition Plan: Patient coming from home, sepsis work-up in progress.  Likely home, however pending PT eval   Consultants:  None  Procedures:  None  Antimicrobials:  Cefepime  DVT prophylaxis: Lovenox   Objective: Vitals:   05/24/19 0500 05/24/19 0530 05/24/19 0600 05/24/19 0800  BP: 97/60 101/60 104/64 (!) 142/86  Pulse: 65 66 70 81  Resp: 16 17 (!) 23 20  Temp:   97.6 F (36.4 C)   TempSrc:   Oral  SpO2: 94% 94% 95% 100%  Weight: 62.4 kg     Height:        Intake/Output Summary (Last 24 hours) at 05/24/2019 1452 Last data filed at 05/24/2019 0533 Gross per 24 hour  Intake 4750 ml  Output --  Net 4750 ml   Filed Weights    05/23/19 2008 05/24/19 0500  Weight: 57.8 kg 62.4 kg    Exam:  General: NAD   Cardiovascular: S1, S2 present  Respiratory: CTAB  Abdomen: Soft, nontender, nondistended, bowel sounds present  Musculoskeletal: No bilateral pedal edema noted  Skin: Normal  Psychiatry: Normal mood   Data Reviewed: CBC: Recent Labs  Lab 05/21/19 1718 05/23/19 1753 05/24/19 0637  WBC 12.1* 16.1* 9.3  HGB 15.6 15.5 12.9*  HCT 45.5 45.4 39.2  MCV 98.3 100.2* 102.6*  PLT 360 333 [id number]   Basic Metabolic Panel: Recent Labs  Lab 05/21/19 1718 05/23/19 1753 05/24/19 0637  NA 136 138 137  K 3.7 4.5 3.5  CL 97* 99 104  CO2 21* 21* 22  GLUCOSE 104* 144* 79  BUN 14 21 19   CREATININE 0.94 2.06* 1.19  CALCIUM 9.6 9.3 8.5*   GFR: Estimated Creatinine Clearance: 51.7 mL/min (by C-G formula based on SCr of 1.19 mg/dL). Liver Function Tests: Recent Labs  Lab 05/23/19 1753 05/24/19 0637  AST 29 19  ALT 15 10  ALKPHOS 88 70  BILITOT 0.8 0.9  PROT 7.2 5.6*  ALBUMIN 3.7 3.0*   No results for input(s): LIPASE, AMYLASE in the last 168 hours. No results for input(s): AMMONIA in the last 168 hours. Coagulation Profile: No results for input(s): INR, PROTIME in the last 168 hours. Cardiac Enzymes: No results for input(s): CKTOTAL, CKMB, CKMBINDEX, TROPONINI in the last 168 hours. BNP (last 3 results) No results for input(s): PROBNP in the last 8760 hours. HbA1C: No results for input(s): HGBA1C in the last 72 hours. CBG: No results for input(s): GLUCAP in the last 168 hours. Lipid Profile: No results for input(s): CHOL, HDL, LDLCALC, TRIG, CHOLHDL, LDLDIRECT in the last 72 hours. Thyroid Function Tests: No results for input(s): TSH, T4TOTAL, FREET4, T3FREE, THYROIDAB in the last 72 hours. Anemia Panel: No results for input(s): VITAMINB12, FOLATE, FERRITIN, TIBC, IRON, RETICCTPCT in the last 72 hours. Urine analysis:    Component Value Date/Time   COLORURINE YELLOW 05/23/2019 2058    APPEARANCEUR CLEAR 05/23/2019 2058   LABSPEC 1.009 05/23/2019 2058   PHURINE 5.0 05/23/2019 2058   GLUCOSEU NEGATIVE 05/23/2019 2058   [place] NEGATIVE 05/23/2019 2058   [place] NEGATIVE 05/23/2019 2058   KETONESUR 5 (A) 05/23/2019 2058   PROTEINUR NEGATIVE 05/23/2019 2058   UROBILINOGEN 1.0 09/01/2013 1753   NITRITE NEGATIVE 05/23/2019 2058   LEUKOCYTESUR NEGATIVE 05/23/2019 2058   Sepsis Labs: @LABRCNTIP (procalcitonin:4,lacticidven:4)  ) Recent Results (from the past 240 hour(s))  Blood culture (routine x 2)     Status: None (Preliminary result)   Collection Time: 05/23/19  5:53 PM   Specimen: BLOOD  Result Value Ref Range Status   Specimen Description   Final    BLOOD LEFT ANTECUBITAL Performed at [facility], [place] [address], [place], [place] [address]    Special Requests   Final    BOTTLES DRAWN AEROBIC AND ANAEROBIC Blood Culture adequate volume Performed at [place] [address], [place], [place] [address]    Culture   Final    NO GROWTH < 24 HOURS Performed at [place]  [address] [place], [place] [address]    Report Status PENDING  Incomplete  Blood culture (routine x 2)     Status: None (Preliminary result)   Collection Time: 05/23/19  7:58 PM   Specimen: BLOOD RIGHT ARM  Result Value Ref Range Status   Specimen Description   Final    BLOOD RIGHT ARM Performed at [facility], [place] [address], [place], [place] [address]    Special Requests   Final    BOTTLES DRAWN AEROBIC AND ANAEROBIC Blood Culture adequate volume Performed at [place] [address], [place], [place] [address]    Culture   Final    NO GROWTH < 24 HOURS Performed at [facility] [address], [place], [place] [address]    Report Status PENDING  Incomplete  Respiratory Panel by RT PCR (Flu A&B, Covid) - Nasopharyngeal Swab     Status: None   Collection Time: 05/23/19 10:53 PM   Specimen:  Nasopharyngeal Swab  Result Value Ref Range Status   SARS Coronavirus 2 by RT PCR NEGATIVE NEGATIVE Final    Comment: (NOTE) SARS-CoV-2 target nucleic acids are NOT DETECTED. The SARS-CoV-2 RNA is generally detectable in upper respiratoy specimens during the acute phase of infection. The lowest concentration of SARS-CoV-2 viral copies this assay can detect is 131 copies/mL. A negative result does not preclude SARS-Cov-2 infection and should not be used as the sole basis for treatment or other patient management decisions. A negative result may occur with  improper specimen collection/handling, submission of specimen other than nasopharyngeal swab, presence of viral mutation(s) within the areas targeted by this assay, and inadequate number of viral copies (<131 copies/mL). A negative result must be combined with clinical observations, patient history, and epidemiological information. The expected result is Negative. Fact Sheet for Patients:  PinkCheek.be Fact Sheet for Healthcare Providers:  GravelBags.it This test is not yet ap proved or cleared by the [place] FDA and  has been authorized for detection and/or diagnosis of SARS-CoV-2 by FDA under an Emergency Use Authorization (EUA). This EUA will remain  in effect (meaning this test can be used) for the duration of the COVID-19 declaration under Section 564(b)(1) of the Act, 21 U.S.C. section 360bbb-3(b)(1), unless the authorization is terminated or revoked sooner.    Influenza A by PCR NEGATIVE NEGATIVE Final   Influenza B by PCR NEGATIVE NEGATIVE Final    Comment: (NOTE) The Xpert Xpress SARS-CoV-2/FLU/RSV assay is intended as an aid in  the diagnosis of influenza from Nasopharyngeal swab specimens and  should not be used as a sole basis for treatment. Nasal washings and  aspirates are unacceptable for Xpert Xpress SARS-CoV-2/FLU/RSV  testing. Fact Sheet for  Patients: PinkCheek.be Fact Sheet for Healthcare Providers: GravelBags.it This test is not yet approved or cleared by the [place] FDA and  has been authorized for detection and/or diagnosis of SARS-CoV-2 by  FDA under an Emergency Use Authorization (EUA). This EUA will remain  in effect (meaning this test can be used) for the duration of the  Covid-19 declaration under Section 564(b)(1) of the Act, 21  U.S.C. section 360bbb-3(b)(1), unless the authorization is  terminated or revoked. Performed at [facility], [place] [address], [place], [place] [address]   MRSA PCR Screening     Status: None   Collection Time: 05/24/19  6:19 AM   Specimen: Nasopharyngeal  Result Value Ref Range Status   MRSA by PCR NEGATIVE NEGATIVE Final  Comment:        The GeneXpert MRSA Assay (FDA approved for NASAL specimens only), is one component of a comprehensive MRSA colonization surveillance program. It is not intended to diagnose MRSA infection nor to guide or monitor treatment for MRSA infections. Performed at [facility], [place] [address] [place],  [address]       Studies: CT ABDOMEN PELVIS WO CONTRAST  Result Date: 05/24/2019 CLINICAL DATA:  Generalized abdominal pain and elevated white blood cell count EXAM: CT ABDOMEN AND PELVIS WITHOUT CONTRAST TECHNIQUE: Multidetector CT imaging of the abdomen and pelvis was performed following the standard protocol without IV contrast. COMPARISON:  None. FINDINGS: Lower chest: Mild scarring is noted in the right lung base. Hepatobiliary: Liver is within normal limits. The gallbladder is decompressed with multiple dependent gallstones. Pancreas: Unremarkable. No pancreatic ductal dilatation or surrounding inflammatory changes. Spleen: Normal in size without focal abnormality. Adrenals/Urinary Tract: Adrenal glands are within normal limits. The kidneys  demonstrate no renal calculi or urinary tract obstructive changes. Small isodense lesion is noted arising from the posterior aspect the left kidney likely representing hyperdense cyst. No obstructive changes are seen. The bladder is well distended. Stomach/Bowel: Scattered diverticular changes noted. No evidence of diverticulitis is seen. The appendix is within normal limits. No small bowel abnormality is noted. The stomach is within normal limits. Vascular/Lymphatic: Aortic atherosclerosis. No enlarged abdominal or pelvic lymph nodes. Reproductive: Prostate is unremarkable. Other: No abdominal wall hernia or abnormality. No abdominopelvic ascites. Musculoskeletal: No acute or significant osseous findings. IMPRESSION: Cholelithiasis without complicating factors. Diverticulosis without diverticulitis. Isodense lesion arising posteriorly from the left kidney measuring approximately 11 mm. This likely represents a hyperdense/hemorrhagic cyst. Electronically Signed   By: Inez Catalina M.D.   On: 05/24/2019 01:31   DG Chest Portable 1 View  Result Date: 05/23/2019 CLINICAL DATA:  Patient with fatigue and dizziness EXAM: PORTABLE CHEST 1 VIEW COMPARISON:  Chest radiograph 02/06/2014 FINDINGS: Normal cardiac and mediastinal contours. No consolidative pulmonary opacities. No pleural effusion or pneumothorax. Thoracic spine degenerative changes. IMPRESSION: No acute cardiopulmonary process. Electronically Signed   By: Lovey Newcomer M.D.   On: 05/23/2019 18:25    Scheduled Meds: . Chlorhexidine Gluconate Cloth  6 each Topical Daily  . clopidogrel  75 mg Oral Daily  . enoxaparin (LOVENOX) injection  40 mg Subcutaneous QHS  . finasteride  5 mg Oral Daily  . gabapentin  300 mg Oral BID  . pravastatin  80 mg Oral q1800  . tamsulosin  0.4 mg Oral Daily    Continuous Infusions: . ceFEPime (MAXIPIME) IV Stopped (05/24/19 1329)  . vancomycin 1,000 mg (05/24/19 1331)     LOS: 1 day     Alma Friendly,  MD [facility]  If 7PM-7AM, please contact night-coverage [email or web address] 05/24/2019, 2:52 PM

## 2019-05-25 LAB — CBC WITH DIFFERENTIAL/PLATELET
Abs Immature Granulocytes: 0.05 10*3/uL (ref 0.00–0.07)
Basophils Absolute: 0.1 10*3/uL (ref 0.0–0.1)
Basophils Relative: 1 %
Eosinophils Absolute: 0.1 10*3/uL (ref 0.0–0.5)
Eosinophils Relative: 1 %
HCT: 39.9 % (ref 39.0–52.0)
Hemoglobin: 13 g/dL (ref 13.0–17.0)
Immature Granulocytes: 0 %
Lymphocytes Relative: 26 %
Lymphs Abs: 2.9 10*3/uL (ref 0.7–4.0)
MCH: 33.2 pg (ref 26.0–34.0)
MCHC: 32.6 g/dL (ref 30.0–36.0)
MCV: 102 fL — ABNORMAL HIGH (ref 80.0–100.0)
Monocytes Absolute: 0.5 10*3/uL (ref 0.1–1.0)
Monocytes Relative: 4 %
Neutro Abs: 7.6 10*3/uL (ref 1.7–7.7)
Neutrophils Relative %: 68 %
Platelets: 202 10*3/uL (ref 150–400)
RBC: 3.91 MIL/uL — ABNORMAL LOW (ref 4.22–5.81)
RDW: 15 % (ref 11.5–15.5)
WBC: 11.2 10*3/uL — ABNORMAL HIGH (ref 4.0–10.5)
nRBC: 0 % (ref 0.0–0.2)

## 2019-05-25 LAB — BASIC METABOLIC PANEL
Anion gap: 7 (ref 5–15)
BUN: 18 mg/dL (ref 8–23)
CO2: 27 mmol/L (ref 22–32)
Calcium: 8.7 mg/dL — ABNORMAL LOW (ref 8.9–10.3)
Chloride: 103 mmol/L (ref 98–111)
Creatinine, Ser: 0.95 mg/dL (ref 0.61–1.24)
GFR calc Af Amer: >60 mL/min (ref >60)
GFR calc non Af Amer: >60 mL/min (ref >60)
Glucose, Bld: 100 mg/dL — ABNORMAL HIGH (ref 70–99)
Potassium: 4.1 mmol/L (ref 3.5–5.1)
Sodium: 137 mmol/L (ref 135–145)

## 2019-05-25 LAB — LIPID PANEL
Cholesterol: 124 mg/dL (ref 0–200)
HDL: 57 mg/dL (ref >40)
LDL Cholesterol: 56 mg/dL (ref 0–99)
Total CHOL/HDL Ratio: 2.2 RATIO
Triglycerides: 56 mg/dL (ref <150)
VLDL: 11 mg/dL (ref 0–40)

## 2019-05-25 LAB — HEMOGLOBIN A1C
Hgb A1c MFr Bld: 6.5 % — ABNORMAL HIGH (ref 4.8–5.6)
Mean Plasma Glucose: 139.85 mg/dL

## 2019-05-25 LAB — GLUCOSE, CAPILLARY
Glucose-Capillary: 121 mg/dL — ABNORMAL HIGH (ref 70–99)
Glucose-Capillary: 108 mg/dL — ABNORMAL HIGH (ref 70–99)
Glucose-Capillary: 116 mg/dL — ABNORMAL HIGH (ref 70–99)
Glucose-Capillary: 115 mg/dL — ABNORMAL HIGH (ref 70–99)

## 2019-05-25 MED ORDER — OXYCODONE HCL 5 MG PO TABS
10.0000 mg | ORAL_TABLET | Freq: Four times a day (QID) | ORAL | Status: DC | PRN
  Administered 2019-05-25 – 2019-05-26 (×2): 10 mg via ORAL
  Filled 2019-05-25 (×2): qty 2

## 2019-05-25 MED ORDER — SODIUM CHLORIDE 0.9 % IV SOLN
2.0000 g | Freq: Three times a day (TID) | INTRAVENOUS | Status: DC
  Administered 2019-05-25: 2 g via INTRAVENOUS
  Filled 2019-05-25 (×2): qty 2

## 2019-05-25 NOTE — Progress Notes (Signed)
Patient removed his tele monitor and is refusing to have it reapplied. He refused to state why he does not want to wear the monitor but has been verbally abusive to the prior nurse because of a change that was made to his pain medication.

## 2019-05-25 NOTE — Progress Notes (Signed)
PROGRESS NOTE  Austin Andrade [id number] DOB: 1951/03/15 DOA: 05/23/2019 PCP: Sandi Mariscal, MD  HPI/Recap of past 38 hours: 69 year old male with history of hypertension, hyperlipidemia, CHF, stroke, alcohol abuse, presents to the ED complaining of near syncope likely 2/2 significant weakness.  Patient reports ongoing poor oral intake due to inability to prepare his meals as well as poor appetite.  Patient also reports loose stools about 1-2 daily which is chronic as per patient.  Patient continues to complain of chronic neck pain of which he receives oxycodone, but reports finishing his supply way before he is due for refill, hence taking Aleve multiple times per day.  In the ED, patient was noted to be hypotensive, saturating well on room air.  Labs showed WBC 16.1, hemoglobin 15.5, BUN 21, creatinine 2.06, lactic acid 4.6--> 2.9, flat trend of troponin although minimally elevated, EKG with normal sinus rhythm, CT abdomen pelvis, unremarkable.  Patient admitted for further management.     Patient seen and examined at bedside.  Still having diarrhea, denies any abdominal pain, nausea/vomiting, fever/chills, chest pain, shortness of breath.     Assessment/Plan: Principal Problem:   Sepsis ([place]) Active Problems:   Hypertension   CHF (congestive heart failure) ([place])   ARF (acute renal failure) ([place])   ??Sepsis Unclear etiology Hypotensive, leukocytosis, lactic acidosis on presentation (??significant dehydration) Currently afebrile, with no leukocytosis Respiratory panel including Covid negative LA 4.6--> 2.9-->1.9 BCX2 NGTD UA negative for infection, but shows some ketones Chest x-ray unremarkable CT abdomen/pelvis showed cholelithiasis without complicating factors, diverticulosis, and a possible hyperdense hemorrhagic cysts in the left kidney measuring approximately 11 mm Status post IV fluid Will DC antibiotics for now as no evidence/source of infection, will monitor patient off  antibiotics Monitor closely  AKI/dehydration Improved Likely 2/2 poor oral intake, BP meds, NSAIDs Improved status post IV fluids Hold lisinopril, hydrochlorothiazide, Metformin Daily BMP  Elevated troponin Chest pain-free Flat trend: 132->173->140-->65 EKG with no acute ST changes Echo done 05/24/2019 showed EF of 60 to 65%, no regional wall motion abnormality  ??Diarrhea CT abdomen/pelvis unremarkable ??Withdrawal from narcotics GI panel pending  Diabetes mellitus type 2 A1c 6.5, controlled SSI, Accu-Cheks, hypoglycemic protocol  History of CVA/hyperlipidemia Continue Plavix, statins  BPH Continue Flomax  Chronic neck pain Continue oxycodone as needed, gabapentin at a lower dose for now        Malnutrition Type:      Malnutrition Characteristics:      Nutrition Interventions:       Estimated body mass index is 20.92 kg/m as calculated from the following:   Height as of this encounter: 5\' 8"  (1.727 m).   Weight as of this encounter: 62.4 kg.     Code Status: Full  Family Communication: None at bedside  Disposition Plan: Patient coming from home, sepsis work-up in progress.  Likely home, however pending PT eval, and close monitoring for another 24 hours off of antibiotics   Consultants:  None  Procedures:  None  Antimicrobials:  D/C Cefepime/vancomycin  DVT prophylaxis: Lovenox   Objective: Vitals:   05/25/19 0800 05/25/19 0840 05/25/19 0932 05/25/19 1147  BP: 120/76  (!) 145/68 133/71  Pulse: 78  79 89  Resp: 19  20 16   Temp:  98 F (36.7 C) 97.7 F (36.5 C) 98.2 F (36.8 C)  TempSrc:  Oral Oral Oral  SpO2: 93%  100% 98%  Weight:      Height:        Intake/Output Summary (Last 24 hours)  at 05/25/2019 1624 Last data filed at 05/25/2019 1500 Gross per 24 hour  Intake 312.12 ml  Output 660 ml  Net -347.88 ml   Filed Weights   05/23/19 2008 05/24/19 0500  Weight: 57.8 kg 62.4 kg    Exam:  General: NAD    Cardiovascular: S1, S2 present  Respiratory: CTAB  Abdomen: Soft, nontender, nondistended, bowel sounds present  Musculoskeletal: No bilateral pedal edema noted  Skin: Normal  Psychiatry: Normal mood   Data Reviewed: CBC: Recent Labs  Lab 05/21/19 1718 05/23/19 1753 05/24/19 0637 05/25/19 0154  WBC 12.1* 16.1* 9.3 11.2*  NEUTROABS  --   --   --  7.6  HGB 15.6 15.5 12.9* 13.0  HCT 45.5 45.4 39.2 39.9  MCV 98.3 100.2* 102.6* 102.0*  PLT 360 333 210 [id number]   Basic Metabolic Panel: Recent Labs  Lab 05/21/19 1718 05/23/19 1753 05/24/19 0637 05/25/19 0154  NA 136 138 137 137  K 3.7 4.5 3.5 4.1  CL 97* 99 104 103  CO2 21* 21* 22 27  GLUCOSE 104* 144* 79 100*  BUN 14 21 19 18   CREATININE 0.94 2.06* 1.19 0.95  CALCIUM 9.6 9.3 8.5* 8.7*   GFR: Estimated Creatinine Clearance: 64.8 mL/min (by C-G formula based on SCr of 0.95 mg/dL). Liver Function Tests: Recent Labs  Lab 05/23/19 1753 05/24/19 0637  AST 29 19  ALT 15 10  ALKPHOS 88 70  BILITOT 0.8 0.9  PROT 7.2 5.6*  ALBUMIN 3.7 3.0*   No results for input(s): LIPASE, AMYLASE in the last 168 hours. No results for input(s): AMMONIA in the last 168 hours. Coagulation Profile: No results for input(s): INR, PROTIME in the last 168 hours. Cardiac Enzymes: No results for input(s): CKTOTAL, CKMB, CKMBINDEX, TROPONINI in the last 168 hours. BNP (last 3 results) No results for input(s): PROBNP in the last 8760 hours. HbA1C: Recent Labs    05/25/19 0154  HGBA1C 6.5*   CBG: Recent Labs  Lab 05/24/19 1642 05/24/19 2132 05/25/19 0814 05/25/19 1145  GLUCAP 136* 119* 121* 108*   Lipid Profile: Recent Labs    05/25/19 0154  CHOL 124  HDL 57  LDLCALC 56  TRIG 56  CHOLHDL 2.2   Thyroid Function Tests: No results for input(s): TSH, T4TOTAL, FREET4, T3FREE, THYROIDAB in the last 72 hours. Anemia Panel: No results for input(s): VITAMINB12, FOLATE, FERRITIN, TIBC, IRON, RETICCTPCT in the last 72  hours. Urine analysis:    Component Value Date/Time   COLORURINE YELLOW 05/23/2019 2058   APPEARANCEUR CLEAR 05/23/2019 2058   LABSPEC 1.009 05/23/2019 2058   PHURINE 5.0 05/23/2019 2058   GLUCOSEU NEGATIVE 05/23/2019 2058   [place] NEGATIVE 05/23/2019 2058   [place] NEGATIVE 05/23/2019 2058   KETONESUR 5 (A) 05/23/2019 2058   PROTEINUR NEGATIVE 05/23/2019 2058   UROBILINOGEN 1.0 09/01/2013 1753   NITRITE NEGATIVE 05/23/2019 2058   LEUKOCYTESUR NEGATIVE 05/23/2019 2058   Sepsis Labs: @LABRCNTIP (procalcitonin:4,lacticidven:4)  ) Recent Results (from the past 240 hour(s))  Blood culture (routine x 2)     Status: None (Preliminary result)   Collection Time: 05/23/19  5:53 PM   Specimen: BLOOD  Result Value Ref Range Status   Specimen Description   Final    BLOOD LEFT ANTECUBITAL Performed at [facility], [place] [address] [place],  [address]    Special Requests   Final    BOTTLES DRAWN AEROBIC AND ANAEROBIC Blood Culture adequate volume Performed at [place] [address] [place],  [place] [address]    Culture   Final    NO GROWTH 2 DAYS Performed at [facility], [place] [address], [place], [place] [address]    Report Status PENDING  Incomplete  Blood culture (routine x 2)     Status: None (Preliminary result)   Collection Time: 05/23/19  7:58 PM   Specimen: BLOOD RIGHT ARM  Result Value Ref Range Status   Specimen Description   Final    BLOOD RIGHT ARM Performed at [facility], [place] [address] [place], [place] [address]    Special Requests   Final    BOTTLES DRAWN AEROBIC AND ANAEROBIC Blood Culture adequate volume Performed at [place] [address] [place], [place] [address]    Culture   Final    NO GROWTH 2 DAYS Performed at [place] [address] [place], [place] [address]    Report Status PENDING  Incomplete  Respiratory Panel by RT PCR (Flu A&B, Covid) -  Nasopharyngeal Swab     Status: None   Collection Time: 05/23/19 10:53 PM   Specimen: Nasopharyngeal Swab  Result Value Ref Range Status   SARS Coronavirus 2 by RT PCR NEGATIVE NEGATIVE Final    Comment: (NOTE) SARS-CoV-2 target nucleic acids are NOT DETECTED. The SARS-CoV-2 RNA is generally detectable in upper respiratoy specimens during the acute phase of infection. The lowest concentration of SARS-CoV-2 viral copies this assay can detect is 131 copies/mL. A negative result does not preclude SARS-Cov-2 infection and should not be used as the sole basis for treatment or other patient management decisions. A negative result may occur with  improper specimen collection/handling, submission of specimen other than nasopharyngeal swab, presence of viral mutation(s) within the areas targeted by this assay, and inadequate number of viral copies (<131 copies/mL). A negative result must be combined with clinical observations, patient history, and epidemiological information. The expected result is Negative. Fact Sheet for Patients:  PinkCheek.be Fact Sheet for Healthcare Providers:  GravelBags.it This test is not yet ap proved or cleared by the [place] FDA and  has been authorized for detection and/or diagnosis of SARS-CoV-2 by FDA under an Emergency Use Authorization (EUA). This EUA will remain  in effect (meaning this test can be used) for the duration of the COVID-19 declaration under Section 564(b)(1) of the Act, 21 U.S.C. section 360bbb-3(b)(1), unless the authorization is terminated or revoked sooner.    Influenza A by PCR NEGATIVE NEGATIVE Final   Influenza B by PCR NEGATIVE NEGATIVE Final    Comment: (NOTE) The Xpert Xpress SARS-CoV-2/FLU/RSV assay is intended as an aid in  the diagnosis of influenza from Nasopharyngeal swab specimens and  should not be used as a sole basis for treatment. Nasal washings and  aspirates  are unacceptable for Xpert Xpress SARS-CoV-2/FLU/RSV  testing. Fact Sheet for Patients: PinkCheek.be Fact Sheet for Healthcare Providers: GravelBags.it This test is not yet approved or cleared by the [place] FDA and  has been authorized for detection and/or diagnosis of SARS-CoV-2 by  FDA under an Emergency Use Authorization (EUA). This EUA will remain  in effect (meaning this test can be used) for the duration of the  Covid-19 declaration under Section 564(b)(1) of the Act, 21  U.S.C. section 360bbb-3(b)(1), unless the authorization is  terminated or revoked. Performed at [facility], [place] [address], [place], [place] [address]   MRSA PCR Screening     Status: None   Collection Time:  05/24/19  6:19 AM   Specimen: Nasopharyngeal  Result Value Ref Range Status   MRSA by PCR NEGATIVE NEGATIVE Final    Comment:        The GeneXpert MRSA Assay (FDA approved for NASAL specimens only), is one component of a comprehensive MRSA colonization surveillance program. It is not intended to diagnose MRSA infection nor to guide or monitor treatment for MRSA infections. Performed at [facility], [place] [address] [place], [place] [address]       Studies: No results found.  Scheduled Meds: . Chlorhexidine Gluconate Cloth  6 each Topical Daily  . clopidogrel  75 mg Oral Daily  . enoxaparin (LOVENOX) injection  40 mg Subcutaneous QHS  . finasteride  5 mg Oral Daily  . gabapentin  300 mg Oral BID  . insulin aspart  0-9 Units Subcutaneous TID WC  . pravastatin  80 mg Oral q1800  . tamsulosin  0.4 mg Oral Daily    Continuous Infusions: . ceFEPime (MAXIPIME) IV 2 g (05/25/19 1015)     LOS: 2 days     Alma Friendly, MD [facility]  If 7PM-7AM, please contact night-coverage [email or web address] 05/25/2019, 4:24 PM

## 2019-05-25 NOTE — Progress Notes (Signed)
Pharmacy Antibiotic Note  Austin Andrade is a 69 y.o. male admitted on 05/23/2019 with sepsis.  Pharmacy has been consulted for cefepime and vancomycin dosing.  Vancomycin stopped on 2/5   Plan: For improved renal function increase Cefepime 2 gm IV to q8h  F/u cultures/renal function  Height: 5\' 8"  (172.7 cm) Weight: 137 lb 9.1 oz (62.4 kg) IBW/kg (Calculated) : 68.4  Temp (24hrs), Avg:97.8 F (36.6 C), Min:97.6 F (36.4 C), Max:97.9 F (36.6 C)  Recent Labs  Lab 05/21/19 1718 05/23/19 1753 05/23/19 2058 05/24/19 0637 05/24/19 1459 05/25/19 0154  WBC 12.1* 16.1*  --  9.3  --  11.2*  CREATININE 0.94 2.06*  --  1.19  --  0.95  LATICACIDVEN  --  4.6* 2.9*  --  1.9  --     Estimated Creatinine Clearance: 64.8 mL/min (by C-G formula based on SCr of 0.95 mg/dL).    No Known Allergies  Antimicrobials this admission: 2/4 cefepime >>  2/4 vancomycin >> 2/5  Dose adjustments this admission:   Microbiology results:  2/4 BCx: ngtd  UCx:    Sputum:    2/5 MRSA PCR: neg  Thank you for allowing pharmacy to be a part of this patient's care.  Dolly Rias RPh 05/25/2019, 8:18 AM

## 2019-05-25 NOTE — TOC Progression Note (Signed)
Transition of Care [facility]) - Progression Note    Patient Details  Name: Austin Andrade MRN: [id number] Date of Birth: [birth year]  Transition of [facility]) CM/SW Contact  Ross Ludwig, [place] Phone Number: 05/25/2019, 9:51 AM  Clinical Narrative:    CSW received consult that patient is concerned about how to pay his hospital bill.  CSW will refer him to talk to billing once he discharges.        Expected Discharge Plan and Services                                                 Social Determinants of Health (SDOH) Interventions    Readmission Risk Interventions No flowsheet data found.
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
## 2019-05-26 DIAGNOSIS — I10 Essential (primary) hypertension: Secondary | ICD-10-CM

## 2019-05-26 DIAGNOSIS — R197 Diarrhea, unspecified: Secondary | ICD-10-CM

## 2019-05-26 LAB — BASIC METABOLIC PANEL
Anion gap: 8 (ref 5–15)
BUN: 15 mg/dL (ref 8–23)
CO2: 26 mmol/L (ref 22–32)
Calcium: 8.7 mg/dL — ABNORMAL LOW (ref 8.9–10.3)
Chloride: 105 mmol/L (ref 98–111)
Creatinine, Ser: 0.74 mg/dL (ref 0.61–1.24)
GFR calc Af Amer: >60 mL/min (ref >60)
GFR calc non Af Amer: >60 mL/min (ref >60)
Glucose, Bld: 98 mg/dL (ref 70–99)
Potassium: 3.7 mmol/L (ref 3.5–5.1)
Sodium: 139 mmol/L (ref 135–145)

## 2019-05-26 LAB — CBC WITH DIFFERENTIAL/PLATELET
Abs Immature Granulocytes: 0.03 10*3/uL (ref 0.00–0.07)
Basophils Absolute: 0.1 10*3/uL (ref 0.0–0.1)
Basophils Relative: 1 %
Eosinophils Absolute: 0.2 10*3/uL (ref 0.0–0.5)
Eosinophils Relative: 2 %
HCT: 37.8 % — ABNORMAL LOW (ref 39.0–52.0)
Hemoglobin: 12.6 g/dL — ABNORMAL LOW (ref 13.0–17.0)
Immature Granulocytes: 0 %
Lymphocytes Relative: 32 %
Lymphs Abs: 3.1 10*3/uL (ref 0.7–4.0)
MCH: 33.7 pg (ref 26.0–34.0)
MCHC: 33.3 g/dL (ref 30.0–36.0)
MCV: 101.1 fL — ABNORMAL HIGH (ref 80.0–100.0)
Monocytes Absolute: 0.5 10*3/uL (ref 0.1–1.0)
Monocytes Relative: 6 %
Neutro Abs: 5.6 10*3/uL (ref 1.7–7.7)
Neutrophils Relative %: 59 %
Platelets: 166 10*3/uL (ref 150–400)
RBC: 3.74 MIL/uL — ABNORMAL LOW (ref 4.22–5.81)
RDW: 14.5 % (ref 11.5–15.5)
WBC: 9.5 10*3/uL (ref 4.0–10.5)
nRBC: 0 % (ref 0.0–0.2)

## 2019-05-26 LAB — GLUCOSE, CAPILLARY: Glucose-Capillary: 85 mg/dL (ref 70–99)

## 2019-05-26 MED ORDER — OXYCODONE HCL 5 MG PO TABS
5.0000 mg | ORAL_TABLET | ORAL | Status: DC | PRN
  Administered 2019-05-26: 5 mg via ORAL
  Filled 2019-05-26: qty 1

## 2019-05-26 MED ORDER — OXYCODONE HCL 15 MG PO TABS: 15.0000 mg | ORAL_TABLET | Freq: Three times a day (TID) | ORAL | 0 refills | Status: AC | PRN

## 2019-05-26 NOTE — Discharge Summary (Signed)
Discharge Summary  Austin Andrade [id number] DOB: 05-03-1950  PCP: Sandi Mariscal, MD  Admit date: 05/23/2019 Discharge date: 05/26/2019  Time spent: 40 mins  Recommendations for Outpatient Follow-up:  1. Follow-up with PCP in 1 week with repeat labs  Discharge Diagnoses:  Active Hospital Problems   Diagnosis Date Noted  . Sepsis ([place]) 05/23/2019  . ARF (acute renal failure) ([place]) 05/23/2019  . CHF (congestive heart failure) ([place]) 01/19/2012  . Hypertension 01/17/2012    Resolved Hospital Problems  No resolved problems to display.    Discharge Condition: Stable  Diet recommendation: Heart healthy  Vitals:   05/25/19 2023 05/26/19 0532  BP: (!) 141/91 (!) 147/90  Pulse: 76 65  Resp: 20 20  Temp: 98.5 F (36.9 C) 97.7 F (36.5 C)  SpO2: 97% 98%    History of present illness:  69 year old male with history of hypertension, hyperlipidemia, CHF, stroke, alcohol abuse, presents to the ED complaining of near syncope likely 2/2 significant weakness.  Patient reports ongoing poor oral intake due to inability to prepare his meals as well as poor appetite.  Patient also reports loose stools about 1-2 daily which is chronic as per patient. Patient continues to complain of chronic neck pain of which he receives oxycodone, but reports finishing his supply way before he is due for refill, hence taking Aleve multiple times per day.  In the ED, patient was noted to be hypotensive, saturating well on room air.  Labs showed WBC 16.1, hemoglobin 15.5, BUN 21, creatinine 2.06, lactic acid 4.6--> 2.9, flat trend of troponin although minimally elevated, EKG with normal sinus rhythm, CT abdomen pelvis, unremarkable.  Patient admitted for further management.    Today, patient denies any new complaints.  Denies any chest pain, shortness of breath, abdominal pain, nausea/vomiting, diarrhea, fever/chills.  Patient stable to be discharged, advised to follow-up with PCP in 1 week.    Hospital Course:    Principal Problem:   Sepsis ([place]) Active Problems:   Hypertension   CHF (congestive heart failure) ([facility])   ARF (acute renal failure) ([facility])   Sepsis ruled out Unlikely sepsis Hypotensive, leukocytosis, lactic acidosis on presentation, likely 2/2 severe dehydration, in conjunction with BP meds as well as Metformin Currently afebrile, with no leukocytosis Respiratory panel including Covid negative LA 4.6--> 2.9-->1.9 BCX2 NGTD UA negative for infection, but shows some ketones Chest x-ray unremarkable CT abdomen/pelvis showed cholelithiasis without complicating factors, diverticulosis, and a possible hyperdense hemorrhagic cysts in the left kidney measuring approximately 11 mm Status post IV fluid Status post empiric antibiotics, discontinued.  Monitored for 24 hours off antibiotics, remained stable Follow-up with PCP in 1 week  AKI/dehydration Resolved Likely 2/2 poor oral intake, BP meds, NSAIDs, Metformin Restart home lisinopril, hydrochlorothiazide, Metformin Follow up with PCP in 1 week  Elevated troponin Chest pain-free Flat trend: 132->173->140-->65 EKG with no acute ST changes Echo done 05/24/2019 showed EF of 60 to 65%, no regional wall motion abnormality Follow-up with PCP  ??Diarrhea CT abdomen/pelvis unremarkable ??Withdrawal from narcotics GI panel pending although not suspecting any infectious cause  Diabetes mellitus type 2 A1c 6.5, controlled Continue home Metformin  History of CVA/hyperlipidemia Continue Plavix, statins  BPH Continue Flomax  Chronic neck pain Continue home chronic pain regimen, oxycodone as needed, gabapentin          Malnutrition Type:      Malnutrition Characteristics:      Nutrition Interventions:      Estimated body mass index is 19.58 kg/m as calculated from  the following:   Height as of this encounter: 5\' 8"  (1.727 m).   Weight as of this encounter: 58.4 kg.     Procedures:  None  Consultations:  None  Discharge Exam: BP (!) 147/90 (BP Location: Right Arm)   Pulse 65   Temp 97.7 F (36.5 C)   Resp 20   Ht 5\' 8"  (1.727 m)   Wt 58.4 kg   SpO2 98%   BMI 19.58 kg/m   General: NAD Cardiovascular: S1, S2 present Respiratory: CTA B  Discharge Instructions You were cared for by a hospitalist during your hospital stay. If you have any questions about your discharge medications or the care you received while you were in the hospital after you are discharged, you can call the unit and asked to speak with the hospitalist on call if the hospitalist that took care of you is not available. Once you are discharged, your primary care physician will handle any further medical issues. Please note that NO REFILLS for any discharge medications will be authorized once you are discharged, as it is imperative that you return to your primary care physician (or establish a relationship with a primary care physician if you do not have one) for your aftercare needs so that they can reassess your need for medications and monitor your lab values.  Discharge Instructions    Diet - low sodium heart healthy   Complete by: As directed    Increase activity slowly   Complete by: As directed      Allergies as of 05/26/2019   No Known Allergies     Medication List    STOP taking these medications   lisinopril 5 MG tablet Commonly known as: ZESTRIL     TAKE these medications   clopidogrel 75 MG tablet Commonly known as: PLAVIX TAKE ONE TABLET DAILY WITH BREAKFAST   finasteride 5 MG tablet Commonly known as: PROSCAR Take 5 mg by mouth daily.   gabapentin 600 MG tablet Commonly known as: NEURONTIN Take 600 mg by mouth 2 (two) times daily.   lisinopril-hydrochlorothiazide 10-12.5 MG tablet Commonly known as: ZESTORETIC Take 1 tablet by mouth daily.   metFORMIN 500 MG tablet Commonly known as: GLUCOPHAGE Take 500 mg by mouth 2 (two) times daily.    oxyCODONE 15 MG immediate release tablet Commonly known as: ROXICODONE Take 1 tablet (15 mg total) by mouth every 8 (eight) hours as needed for up to 3 days for pain. What changed: when to take this   pravastatin 80 MG tablet Commonly known as: PRAVACHOL TAKE ONE TABLET IN THE EVENING   tamsulosin 0.4 MG Caps capsule Commonly known as: FLOMAX Take 0.4 mg by mouth daily.   tiZANidine 4 MG tablet Commonly known as: ZANAFLEX Take 4 mg by mouth 4 (four) times daily as needed for muscle spasms.      No Known Allergies Follow-up Information    Sandi Mariscal, MD. Schedule an appointment as soon as possible for a visit in 1 week(s).   Specialty: Internal Medicine Contact information: [place] [address] [phone]            The results of significant diagnostics from this hospitalization (including imaging, microbiology, ancillary and laboratory) are listed below for reference.    Significant Diagnostic Studies: CT ABDOMEN PELVIS WO CONTRAST  Result Date: 05/24/2019 CLINICAL DATA:  Generalized abdominal pain and elevated white blood cell count EXAM: CT ABDOMEN AND PELVIS WITHOUT CONTRAST TECHNIQUE: Multidetector CT imaging of the abdomen and  pelvis was performed following the standard protocol without IV contrast. COMPARISON:  None. FINDINGS: Lower chest: Mild scarring is noted in the right lung base. Hepatobiliary: Liver is within normal limits. The gallbladder is decompressed with multiple dependent gallstones. Pancreas: Unremarkable. No pancreatic ductal dilatation or surrounding inflammatory changes. Spleen: Normal in size without focal abnormality. Adrenals/Urinary Tract: Adrenal glands are within normal limits. The kidneys demonstrate no renal calculi or urinary tract obstructive changes. Small isodense lesion is noted arising from the posterior aspect the left kidney likely representing hyperdense cyst. No obstructive changes are seen. The bladder is well  distended. Stomach/Bowel: Scattered diverticular changes noted. No evidence of diverticulitis is seen. The appendix is within normal limits. No small bowel abnormality is noted. The stomach is within normal limits. Vascular/Lymphatic: Aortic atherosclerosis. No enlarged abdominal or pelvic lymph nodes. Reproductive: Prostate is unremarkable. Other: No abdominal wall hernia or abnormality. No abdominopelvic ascites. Musculoskeletal: No acute or significant osseous findings. IMPRESSION: Cholelithiasis without complicating factors. Diverticulosis without diverticulitis. Isodense lesion arising posteriorly from the left kidney measuring approximately 11 mm. This likely represents a hyperdense/hemorrhagic cyst. Electronically Signed   By: Inez Catalina M.D.   On: 05/24/2019 01:31   DG Chest Portable 1 View  Result Date: 05/23/2019 CLINICAL DATA:  Patient with fatigue and dizziness EXAM: PORTABLE CHEST 1 VIEW COMPARISON:  Chest radiograph 02/06/2014 FINDINGS: Normal cardiac and mediastinal contours. No consolidative pulmonary opacities. No pleural effusion or pneumothorax. Thoracic spine degenerative changes. IMPRESSION: No acute cardiopulmonary process. Electronically Signed   By: Lovey Newcomer M.D.   On: 05/23/2019 18:25   ECHOCARDIOGRAM COMPLETE  Result Date: 05/24/2019   ECHOCARDIOGRAM REPORT   Patient Name:   Austin Andrade Date of Exam: 05/24/2019 Medical Rec #:  [id number]    Height:       68.0 in Accession #:    [id number]   Weight:       137.6 lb Date of Birth:  Nov 14, 1950    BSA:          1.74 m Patient Age:    40 years     BP:           121/59 mmHg Patient Gender: M            HR:           88 bpm. Exam Location:  Inpatient Procedure: 2D Echo, Color Doppler and Cardiac Doppler Indications:    Elevated Troponin  History:        Patient has prior history of Echocardiogram examinations, most                 recent 02/11/2014. CHF; Risk Factors:Hypertension and                 Dyslipidemia.  Sonographer:    Raquel Sarna  Senior RDCS Referring Phys: [address]  Sonographer Comments: Technically difficult study due to poor echo windows, suboptimal parasternal window and suboptimal subcostal window. IMPRESSIONS  1. Technically difficult study with limited views  2. Left ventricular ejection fraction, by visual estimation, is grossly 60 to 65%. The left ventricle has normal function.  3. Global right ventricle has normal systolic function.The right ventricular size is normal.  4. Left atrial size was normal.  5. Right atrial size was normal.  6. The mitral valve is normal in structure. No evidence of mitral valve regurgitation.  7. The tricuspid valve is grossly normal. Tricuspid valve regurgitation is trivial.  8. The pulmonic valve was not well visualized. Pulmonic  valve regurgitation is not visualized.  9. The aortic valve was not well visualized. Aortic valve regurgitation is moderate. No evidence of aortic valve stenosis. 10. Normal pulmonary artery systolic pressure. FINDINGS  Left Ventricle: Left ventricular ejection fraction, by visual estimation, is 60 to 65%. The left ventricle has normal function. The left ventricle has no regional wall motion abnormalities. There is no left ventricular hypertrophy. Left ventricular diastolic parameters were normal. Right Ventricle: The right ventricular size is normal. No increase in right ventricular wall thickness. Global RV systolic function is has normal systolic function. The tricuspid regurgitant velocity is 2.02 m/s, and with an assumed right atrial pressure  of 8 mmHg, the estimated right ventricular systolic pressure is normal at 24.3 mmHg. Left Atrium: Left atrial size was normal in size. Right Atrium: Right atrial size was normal in size Pericardium: There is no evidence of pericardial effusion. Mitral Valve: The mitral valve is normal in structure. No evidence of mitral valve regurgitation. Tricuspid Valve: The tricuspid valve is grossly normal. Tricuspid valve regurgitation  is trivial. Aortic Valve: The aortic valve was not well visualized. Aortic valve regurgitation is moderate. The aortic valve is structurally normal, with no evidence of sclerosis or stenosis. Pulmonic Valve: The pulmonic valve was not well visualized. Pulmonic valve regurgitation is not visualized. Pulmonic regurgitation is not visualized. Aorta: The aortic root is normal in size and structure. IAS/Shunts: The interatrial septum was not well visualized.  LEFT VENTRICLE PLAX 2D LVIDd:         3.65 cm  Diastology LVIDs:         2.47 cm  LV e' lateral:   7.40 cm/s LV PW:         0.94 cm  LV E/e' lateral: 7.4 LV IVS:        0.87 cm  LV e' medial:    6.96 cm/s LVOT diam:     2.00 cm  LV E/e' medial:  7.9 LV SV:         35 ml LV SV Index:   20.04 LVOT Area:     3.14 cm  RIGHT VENTRICLE RV S prime:     15.10 cm/s TAPSE (M-mode): 2.1 cm LEFT ATRIUM             Index       RIGHT ATRIUM           Index LA diam:        3.30 cm 1.89 cm/m  RA Area:     13.80 cm LA Vol (A2C):   54.4 ml 31.21 ml/m RA Volume:   30.90 ml  17.73 ml/m LA Vol (A4C):   35.9 ml 20.59 ml/m LA Biplane Vol: 46.0 ml 26.39 ml/m  AORTIC VALVE LVOT Vmax:   117.00 cm/s LVOT Vmean:  88.200 cm/s LVOT VTI:    0.234 m  AORTA Ao Root diam: 3.20 cm MITRAL VALVE                        TRICUSPID VALVE MV Area (PHT): 2.62 cm             TR Peak grad:   16.3 mmHg MV PHT:        83.81 msec           TR Vmax:        202.00 cm/s MV Decel Time: 289 msec MV E velocity: 54.80 cm/s 103 cm/s  SHUNTS MV A velocity: 93.60 cm/s 70.3 cm/s Systemic VTI:  0.23 m MV E/A ratio:  0.59       1.5       Systemic Diam: 2.00 cm  Oswaldo Milian MD Electronically signed by Oswaldo Milian MD Signature Date/Time: 05/24/2019/10:45:03 PM    Final     Microbiology: Recent Results (from the past 240 hour(s))  Blood culture (routine x 2)     Status: None (Preliminary result)   Collection Time: 05/23/19  5:53 PM   Specimen: BLOOD  Result Value Ref Range Status   Specimen  Description   Final    BLOOD LEFT ANTECUBITAL Performed at [facility] [address] [place], [place] [address]    Special Requests   Final    BOTTLES DRAWN AEROBIC AND ANAEROBIC Blood Culture adequate volume Performed at [facility] [address] [place], [place] [address]    Culture   Final    NO GROWTH 3 DAYS Performed at [facility], [place] [address] [place], [place] [address]    Report Status PENDING  Incomplete  Blood culture (routine x 2)     Status: None (Preliminary result)   Collection Time: 05/23/19  7:58 PM   Specimen: BLOOD RIGHT ARM  Result Value Ref Range Status   Specimen Description   Final    BLOOD RIGHT ARM Performed at [facility], [place] [address] [place], [place] [address]    Special Requests   Final    BOTTLES DRAWN AEROBIC AND ANAEROBIC Blood Culture adequate volume Performed at [facility] [address], [place], [place] [address]    Culture   Final    NO GROWTH 3 DAYS Performed at [facility], [place] [address], [place], [place] [address]    Report Status PENDING  Incomplete  Respiratory Panel by RT PCR (Flu A&B, Covid) - Nasopharyngeal Swab     Status: None   Collection Time: 05/23/19 10:53 PM   Specimen: Nasopharyngeal Swab  Result Value Ref Range Status   SARS Coronavirus 2 by RT PCR NEGATIVE NEGATIVE Final    Comment: (NOTE) SARS-CoV-2 target nucleic acids are NOT DETECTED. The SARS-CoV-2 RNA is generally detectable in upper respiratoy specimens during the acute phase of infection. The lowest concentration of SARS-CoV-2 viral copies this assay can detect is 131 copies/mL. A negative result does not preclude SARS-Cov-2 infection and should not be used as the sole basis for treatment or other patient management decisions. A negative result may occur with  improper specimen collection/handling, submission of specimen other than nasopharyngeal swab, presence of  viral mutation(s) within the areas targeted by this assay, and inadequate number of viral copies (<131 copies/mL). A negative result must be combined with clinical observations, patient history, and epidemiological information. The expected result is Negative. Fact Sheet for Patients:  PinkCheek.be Fact Sheet for Healthcare Providers:  GravelBags.it This test is not yet ap proved or cleared by the [place] FDA and  has been authorized for detection and/or diagnosis of SARS-CoV-2 by FDA under an Emergency Use Authorization (EUA). This EUA will remain  in effect (meaning this test can be used) for the duration of the COVID-19 declaration under Section 564(b)(1) of the Act, 21 U.S.C. section 360bbb-3(b)(1), unless the authorization is terminated or revoked sooner.    Influenza A by PCR NEGATIVE NEGATIVE Final   Influenza B by PCR NEGATIVE NEGATIVE Final    Comment: (NOTE) The Xpert Xpress SARS-CoV-2/FLU/RSV assay is intended as an aid in  the diagnosis  of influenza from Nasopharyngeal swab specimens and  should not be used as a sole basis for treatment. Nasal washings and  aspirates are unacceptable for Xpert Xpress SARS-CoV-2/FLU/RSV  testing. Fact Sheet for Patients: PinkCheek.be Fact Sheet for Healthcare Providers: GravelBags.it This test is not yet approved or cleared by the [place] FDA and  has been authorized for detection and/or diagnosis of SARS-CoV-2 by  FDA under an Emergency Use Authorization (EUA). This EUA will remain  in effect (meaning this test can be used) for the duration of the  Covid-19 declaration under Section 564(b)(1) of the Act, 21  U.S.C. section 360bbb-3(b)(1), unless the authorization is  terminated or revoked. Performed at [facility], [place] [address] [place], [place] [address]   MRSA PCR Screening     Status:  None   Collection Time: 05/24/19  6:19 AM   Specimen: Nasopharyngeal  Result Value Ref Range Status   MRSA by PCR NEGATIVE NEGATIVE Final    Comment:        The GeneXpert MRSA Assay (FDA approved for NASAL specimens only), is one component of a comprehensive MRSA colonization surveillance program. It is not intended to diagnose MRSA infection nor to guide or monitor treatment for MRSA infections. Performed at [facility], [place] [address] [place], [place] [address]      Labs: Basic Metabolic Panel: Recent Labs  Lab 05/21/19 1718 05/23/19 1753 05/24/19 0637 05/25/19 0154 05/26/19 0411  NA 136 138 137 137 139  K 3.7 4.5 3.5 4.1 3.7  CL 97* 99 104 103 105  CO2 21* 21* 22 27 26   GLUCOSE 104* 144* 79 100* 98  BUN 14 21 19 18 15   CREATININE 0.94 2.06* 1.19 0.95 0.74  CALCIUM 9.6 9.3 8.5* 8.7* 8.7*   Liver Function Tests: Recent Labs  Lab 05/23/19 1753 05/24/19 0637  AST 29 19  ALT 15 10  ALKPHOS 88 70  BILITOT 0.8 0.9  PROT 7.2 5.6*  ALBUMIN 3.7 3.0*   No results for input(s): LIPASE, AMYLASE in the last 168 hours. No results for input(s): AMMONIA in the last 168 hours. CBC: Recent Labs  Lab 05/21/19 1718 05/23/19 1753 05/24/19 0637 05/25/19 0154 05/26/19 0411  WBC 12.1* 16.1* 9.3 11.2* 9.5  NEUTROABS  --   --   --  7.6 5.6  HGB 15.6 15.5 12.9* 13.0 12.6*  HCT 45.5 45.4 39.2 39.9 37.8*  MCV 98.3 100.2* 102.6* 102.0* 101.1*  PLT 360 333 210 202 166   Cardiac Enzymes: No results for input(s): CKTOTAL, CKMB, CKMBINDEX, TROPONINI in the last 168 hours. BNP: BNP (last 3 results) No results for input(s): BNP in the last 8760 hours.  ProBNP (last 3 results) No results for input(s): PROBNP in the last 8760 hours.  CBG: Recent Labs  Lab 05/25/19 0814 05/25/19 1145 05/25/19 1706 05/25/19 2020 05/26/19 0748  GLUCAP 121* 108* 116* 115* 85       Signed:  Alma Friendly, MD [facility] 05/26/2019, 9:51 AM

## 2019-05-26 NOTE — TOC Progression Note (Addendum)
Transition of Care [facility]) - Progression Note    Patient Details  Name: Austin Andrade MRN: [id number] Date of Birth: 1950-06-09  Transition of [facility]) CM/SW Contact  Servando Snare, [place] Phone Number: 05/26/2019, 10:02 AM  Clinical Narrative:    Received a call from attending stating patient needed assistance with transportation. Patient is able to pay for taxi at dc. LCSW will call taxi for patient when ready to dc. RN please call weekend CSW when patient is ready to leave [phone].        Expected Discharge Plan and Services           Expected Discharge Date: 05/26/19                                     Social Determinants of Health (SDOH) Interventions    Readmission Risk Interventions No flowsheet data found.

## 2019-05-28 LAB — CULTURE, BLOOD (ROUTINE X 2)
Culture: NO GROWTH
Culture: NO GROWTH
Special Requests: ADEQUATE
Special Requests: ADEQUATE

## 2019-05-28 LAB — GI PATHOGEN PANEL BY PCR, STOOL

## 2020-10-30 ENCOUNTER — Inpatient Hospital Stay (HOSPITAL_COMMUNITY): Payer: Medicare Other

## 2020-10-30 ENCOUNTER — Other Ambulatory Visit: Payer: Self-pay

## 2020-10-30 ENCOUNTER — Encounter (HOSPITAL_COMMUNITY): Payer: Self-pay | Admitting: Emergency Medicine

## 2020-10-30 ENCOUNTER — Inpatient Hospital Stay (HOSPITAL_COMMUNITY): Payer: Medicare Other | Admitting: Certified Registered"

## 2020-10-30 ENCOUNTER — Encounter (HOSPITAL_COMMUNITY)
Admission: EM | Disposition: A | Discharge status: Skilled Nursing Facility | Payer: Self-pay | Source: Home / Self Care | Attending: Internal Medicine

## 2020-10-30 ENCOUNTER — Inpatient Hospital Stay (HOSPITAL_COMMUNITY)
Admission: EM | Admit: 2020-10-30 | Discharge: 2020-11-11 | DRG: 492 | Disposition: A | Discharge status: Skilled Nursing Facility | Payer: Medicare Other | Attending: Internal Medicine | Admitting: Internal Medicine

## 2020-10-30 ENCOUNTER — Emergency Department (HOSPITAL_COMMUNITY): Payer: Medicare Other

## 2020-10-30 DIAGNOSIS — S82202A Unspecified fracture of shaft of left tibia, initial encounter for closed fracture: Secondary | ICD-10-CM

## 2020-10-30 DIAGNOSIS — N189 Chronic kidney disease, unspecified: Secondary | ICD-10-CM

## 2020-10-30 DIAGNOSIS — E11649 Type 2 diabetes mellitus with hypoglycemia without coma: Secondary | ICD-10-CM | POA: Diagnosis present

## 2020-10-30 DIAGNOSIS — Z833 Family history of diabetes mellitus: Secondary | ICD-10-CM

## 2020-10-30 DIAGNOSIS — E871 Hypo-osmolality and hyponatremia: Secondary | ICD-10-CM | POA: Diagnosis present

## 2020-10-30 DIAGNOSIS — G9608 Other cranial cerebrospinal fluid leak: Secondary | ICD-10-CM | POA: Diagnosis present

## 2020-10-30 DIAGNOSIS — Z7902 Long term (current) use of antithrombotics/antiplatelets: Secondary | ICD-10-CM

## 2020-10-30 DIAGNOSIS — J9601 Acute respiratory failure with hypoxia: Secondary | ICD-10-CM | POA: Diagnosis not present

## 2020-10-30 DIAGNOSIS — Z23 Encounter for immunization: Secondary | ICD-10-CM | POA: Diagnosis present

## 2020-10-30 DIAGNOSIS — G9341 Metabolic encephalopathy: Secondary | ICD-10-CM | POA: Diagnosis present

## 2020-10-30 DIAGNOSIS — I959 Hypotension, unspecified: Secondary | ICD-10-CM | POA: Diagnosis not present

## 2020-10-30 DIAGNOSIS — F17200 Nicotine dependence, unspecified, uncomplicated: Secondary | ICD-10-CM

## 2020-10-30 DIAGNOSIS — E119 Type 2 diabetes mellitus without complications: Secondary | ICD-10-CM | POA: Diagnosis not present

## 2020-10-30 DIAGNOSIS — M6289 Other specified disorders of muscle: Secondary | ICD-10-CM

## 2020-10-30 DIAGNOSIS — W109XXA Fall (on) (from) unspecified stairs and steps, initial encounter: Secondary | ICD-10-CM | POA: Diagnosis present

## 2020-10-30 DIAGNOSIS — I5031 Acute diastolic (congestive) heart failure: Secondary | ICD-10-CM | POA: Diagnosis present

## 2020-10-30 DIAGNOSIS — F1020 Alcohol dependence, uncomplicated: Secondary | ICD-10-CM

## 2020-10-30 DIAGNOSIS — R339 Retention of urine, unspecified: Secondary | ICD-10-CM | POA: Diagnosis not present

## 2020-10-30 DIAGNOSIS — X58XXXA Exposure to other specified factors, initial encounter: Secondary | ICD-10-CM | POA: Diagnosis present

## 2020-10-30 DIAGNOSIS — T148XXA Other injury of unspecified body region, initial encounter: Secondary | ICD-10-CM

## 2020-10-30 DIAGNOSIS — I69354 Hemiplegia and hemiparesis following cerebral infarction affecting left non-dominant side: Secondary | ICD-10-CM | POA: Diagnosis not present

## 2020-10-30 DIAGNOSIS — I998 Other disorder of circulatory system: Secondary | ICD-10-CM | POA: Diagnosis not present

## 2020-10-30 DIAGNOSIS — D181 Lymphangioma, any site: Secondary | ICD-10-CM | POA: Diagnosis present

## 2020-10-30 DIAGNOSIS — D6959 Other secondary thrombocytopenia: Secondary | ICD-10-CM | POA: Diagnosis present

## 2020-10-30 DIAGNOSIS — Z20822 Contact with and (suspected) exposure to covid-19: Secondary | ICD-10-CM | POA: Diagnosis present

## 2020-10-30 DIAGNOSIS — N179 Acute kidney failure, unspecified: Secondary | ICD-10-CM

## 2020-10-30 DIAGNOSIS — M6282 Rhabdomyolysis: Secondary | ICD-10-CM | POA: Diagnosis present

## 2020-10-30 DIAGNOSIS — G9340 Encephalopathy, unspecified: Secondary | ICD-10-CM | POA: Diagnosis not present

## 2020-10-30 DIAGNOSIS — F17210 Nicotine dependence, cigarettes, uncomplicated: Secondary | ICD-10-CM | POA: Diagnosis present

## 2020-10-30 DIAGNOSIS — E440 Moderate protein-calorie malnutrition: Secondary | ICD-10-CM | POA: Diagnosis present

## 2020-10-30 DIAGNOSIS — E876 Hypokalemia: Secondary | ICD-10-CM | POA: Diagnosis not present

## 2020-10-30 DIAGNOSIS — I4891 Unspecified atrial fibrillation: Secondary | ICD-10-CM

## 2020-10-30 DIAGNOSIS — R778 Other specified abnormalities of plasma proteins: Secondary | ICD-10-CM | POA: Diagnosis not present

## 2020-10-30 DIAGNOSIS — K219 Gastro-esophageal reflux disease without esophagitis: Secondary | ICD-10-CM | POA: Diagnosis present

## 2020-10-30 DIAGNOSIS — R52 Pain, unspecified: Secondary | ICD-10-CM

## 2020-10-30 DIAGNOSIS — I63542 Cerebral infarction due to unspecified occlusion or stenosis of left cerebellar artery: Secondary | ICD-10-CM | POA: Diagnosis not present

## 2020-10-30 DIAGNOSIS — F10239 Alcohol dependence with withdrawal, unspecified: Secondary | ICD-10-CM | POA: Diagnosis not present

## 2020-10-30 DIAGNOSIS — I429 Cardiomyopathy, unspecified: Secondary | ICD-10-CM | POA: Diagnosis present

## 2020-10-30 DIAGNOSIS — K709 Alcoholic liver disease, unspecified: Secondary | ICD-10-CM | POA: Diagnosis present

## 2020-10-30 DIAGNOSIS — E1165 Type 2 diabetes mellitus with hyperglycemia: Secondary | ICD-10-CM | POA: Diagnosis not present

## 2020-10-30 DIAGNOSIS — M62838 Other muscle spasm: Secondary | ICD-10-CM | POA: Diagnosis not present

## 2020-10-30 DIAGNOSIS — I351 Nonrheumatic aortic (valve) insufficiency: Secondary | ICD-10-CM | POA: Diagnosis not present

## 2020-10-30 DIAGNOSIS — I480 Paroxysmal atrial fibrillation: Secondary | ICD-10-CM | POA: Diagnosis not present

## 2020-10-30 DIAGNOSIS — E872 Acidosis: Secondary | ICD-10-CM | POA: Diagnosis present

## 2020-10-30 DIAGNOSIS — I080 Rheumatic disorders of both mitral and aortic valves: Secondary | ICD-10-CM | POA: Diagnosis present

## 2020-10-30 DIAGNOSIS — I6389 Other cerebral infarction: Secondary | ICD-10-CM | POA: Diagnosis not present

## 2020-10-30 DIAGNOSIS — S065XAA Traumatic subdural hemorrhage with loss of consciousness status unknown, initial encounter: Secondary | ICD-10-CM | POA: Diagnosis present

## 2020-10-30 DIAGNOSIS — N19 Unspecified kidney failure: Secondary | ICD-10-CM

## 2020-10-30 DIAGNOSIS — I110 Hypertensive heart disease with heart failure: Secondary | ICD-10-CM | POA: Diagnosis present

## 2020-10-30 DIAGNOSIS — J690 Pneumonitis due to inhalation of food and vomit: Secondary | ICD-10-CM | POA: Diagnosis not present

## 2020-10-30 DIAGNOSIS — S82852B Displaced trimalleolar fracture of left lower leg, initial encounter for open fracture type I or II: Secondary | ICD-10-CM | POA: Diagnosis present

## 2020-10-30 DIAGNOSIS — L89616 Pressure-induced deep tissue damage of right heel: Secondary | ICD-10-CM | POA: Diagnosis present

## 2020-10-30 DIAGNOSIS — I6300 Cerebral infarction due to thrombosis of unspecified precerebral artery: Secondary | ICD-10-CM | POA: Diagnosis not present

## 2020-10-30 DIAGNOSIS — S82402B Unspecified fracture of shaft of left fibula, initial encounter for open fracture type I or II: Secondary | ICD-10-CM

## 2020-10-30 DIAGNOSIS — Z419 Encounter for procedure for purposes other than remedying health state, unspecified: Secondary | ICD-10-CM

## 2020-10-30 DIAGNOSIS — S82832A Other fracture of upper and lower end of left fibula, initial encounter for closed fracture: Secondary | ICD-10-CM | POA: Diagnosis not present

## 2020-10-30 DIAGNOSIS — Z79899 Other long term (current) drug therapy: Secondary | ICD-10-CM

## 2020-10-30 DIAGNOSIS — J189 Pneumonia, unspecified organism: Secondary | ICD-10-CM

## 2020-10-30 DIAGNOSIS — M542 Cervicalgia: Secondary | ICD-10-CM | POA: Diagnosis not present

## 2020-10-30 DIAGNOSIS — Z85828 Personal history of other malignant neoplasm of skin: Secondary | ICD-10-CM

## 2020-10-30 DIAGNOSIS — J810 Acute pulmonary edema: Secondary | ICD-10-CM | POA: Diagnosis not present

## 2020-10-30 DIAGNOSIS — S065X9A Traumatic subdural hemorrhage with loss of consciousness of unspecified duration, initial encounter: Secondary | ICD-10-CM | POA: Diagnosis present

## 2020-10-30 DIAGNOSIS — S82402A Unspecified fracture of shaft of left fibula, initial encounter for closed fracture: Secondary | ICD-10-CM

## 2020-10-30 DIAGNOSIS — I509 Heart failure, unspecified: Secondary | ICD-10-CM

## 2020-10-30 DIAGNOSIS — Z681 Body mass index (BMI) 19 or less, adult: Secondary | ICD-10-CM

## 2020-10-30 DIAGNOSIS — S82202B Unspecified fracture of shaft of left tibia, initial encounter for open fracture type I or II: Secondary | ICD-10-CM

## 2020-10-30 DIAGNOSIS — E785 Hyperlipidemia, unspecified: Secondary | ICD-10-CM | POA: Diagnosis present

## 2020-10-30 DIAGNOSIS — I340 Nonrheumatic mitral (valve) insufficiency: Secondary | ICD-10-CM | POA: Diagnosis not present

## 2020-10-30 DIAGNOSIS — Z981 Arthrodesis status: Secondary | ICD-10-CM

## 2020-10-30 DIAGNOSIS — Z7289 Other problems related to lifestyle: Secondary | ICD-10-CM | POA: Diagnosis not present

## 2020-10-30 DIAGNOSIS — I639 Cerebral infarction, unspecified: Secondary | ICD-10-CM | POA: Diagnosis not present

## 2020-10-30 DIAGNOSIS — G8929 Other chronic pain: Secondary | ICD-10-CM | POA: Diagnosis present

## 2020-10-30 DIAGNOSIS — R5381 Other malaise: Secondary | ICD-10-CM

## 2020-10-30 DIAGNOSIS — M109 Gout, unspecified: Secondary | ICD-10-CM | POA: Diagnosis present

## 2020-10-30 DIAGNOSIS — R4182 Altered mental status, unspecified: Secondary | ICD-10-CM | POA: Diagnosis not present

## 2020-10-30 DIAGNOSIS — I129 Hypertensive chronic kidney disease with stage 1 through stage 4 chronic kidney disease, or unspecified chronic kidney disease: Secondary | ICD-10-CM | POA: Diagnosis not present

## 2020-10-30 DIAGNOSIS — D539 Nutritional anemia, unspecified: Secondary | ICD-10-CM | POA: Diagnosis present

## 2020-10-30 DIAGNOSIS — S82302A Unspecified fracture of lower end of left tibia, initial encounter for closed fracture: Secondary | ICD-10-CM | POA: Diagnosis not present

## 2020-10-30 DIAGNOSIS — E875 Hyperkalemia: Secondary | ICD-10-CM | POA: Diagnosis present

## 2020-10-30 DIAGNOSIS — K149 Disease of tongue, unspecified: Secondary | ICD-10-CM | POA: Diagnosis present

## 2020-10-30 DIAGNOSIS — Z7984 Long term (current) use of oral hypoglycemic drugs: Secondary | ICD-10-CM

## 2020-10-30 DIAGNOSIS — R0682 Tachypnea, not elsewhere classified: Secondary | ICD-10-CM

## 2020-10-30 HISTORY — PX: I & D EXTREMITY: SHX5045

## 2020-10-30 HISTORY — PX: EXTERNAL FIXATION LEG: SHX1549

## 2020-10-30 LAB — URINALYSIS, ROUTINE W REFLEX MICROSCOPIC
Bilirubin Urine: NEGATIVE
Glucose, UA: NEGATIVE mg/dL
Ketones, ur: 5 mg/dL — AB
Leukocytes,Ua: NEGATIVE
Nitrite: NEGATIVE
Protein, ur: 30 mg/dL — AB
Specific Gravity, Urine: 1.009 (ref 1.005–1.030)
pH: 5 (ref 5.0–8.0)

## 2020-10-30 LAB — I-STAT VENOUS BLOOD GAS, ED
Acid-base deficit: 10 mmol/L — ABNORMAL HIGH (ref 0.0–2.0)
Bicarbonate: 16.4 mmol/L — ABNORMAL LOW (ref 20.0–28.0)
Calcium, Ion: 0.9 mmol/L — ABNORMAL LOW (ref 1.15–1.40)
HCT: 36 % — ABNORMAL LOW (ref 39.0–52.0)
Hemoglobin: 12.2 g/dL — ABNORMAL LOW (ref 13.0–17.0)
O2 Saturation: 67 %
Potassium: 5.4 mmol/L — ABNORMAL HIGH (ref 3.5–5.1)
Sodium: 125 mmol/L — ABNORMAL LOW (ref 135–145)
TCO2: 18 mmol/L — ABNORMAL LOW (ref 22–32)
pCO2, Ven: 37.7 mmHg — ABNORMAL LOW (ref 44.0–60.0)
pH, Ven: 7.246 — ABNORMAL LOW (ref 7.250–7.430)
pO2, Ven: 40 mmHg (ref 32.0–45.0)

## 2020-10-30 LAB — COMPREHENSIVE METABOLIC PANEL
ALT: 938 U/L — ABNORMAL HIGH (ref 0–44)
ALT: 873 U/L — ABNORMAL HIGH (ref 0–44)
AST: 1354 U/L — ABNORMAL HIGH (ref 15–41)
AST: 1114 U/L — ABNORMAL HIGH (ref 15–41)
Albumin: 3.3 g/dL — ABNORMAL LOW (ref 3.5–5.0)
Albumin: 3 g/dL — ABNORMAL LOW (ref 3.5–5.0)
Alkaline Phosphatase: 100 U/L (ref 38–126)
Alkaline Phosphatase: 100 U/L (ref 38–126)
Anion gap: 20 — ABNORMAL HIGH (ref 5–15)
Anion gap: 18 — ABNORMAL HIGH (ref 5–15)
BUN: 77 mg/dL — ABNORMAL HIGH (ref 8–23)
BUN: 75 mg/dL — ABNORMAL HIGH (ref 8–23)
CO2: 12 mmol/L — ABNORMAL LOW (ref 22–32)
CO2: 15 mmol/L — ABNORMAL LOW (ref 22–32)
Calcium: 7.2 mg/dL — ABNORMAL LOW (ref 8.9–10.3)
Calcium: 7.2 mg/dL — ABNORMAL LOW (ref 8.9–10.3)
Chloride: 92 mmol/L — ABNORMAL LOW (ref 98–111)
Chloride: 95 mmol/L — ABNORMAL LOW (ref 98–111)
Creatinine, Ser: 5.51 mg/dL — ABNORMAL HIGH (ref 0.61–1.24)
Creatinine, Ser: 5.52 mg/dL — ABNORMAL HIGH (ref 0.61–1.24)
GFR, Estimated: 10 mL/min — ABNORMAL LOW (ref >60)
GFR, Estimated: 10 mL/min — ABNORMAL LOW (ref >60)
Glucose, Bld: 116 mg/dL — ABNORMAL HIGH (ref 70–99)
Glucose, Bld: 52 mg/dL — ABNORMAL LOW (ref 70–99)
Potassium: 5.5 mmol/L — ABNORMAL HIGH (ref 3.5–5.1)
Potassium: 4.9 mmol/L (ref 3.5–5.1)
Sodium: 124 mmol/L — ABNORMAL LOW (ref 135–145)
Sodium: 128 mmol/L — ABNORMAL LOW (ref 135–145)
Total Bilirubin: 2.3 mg/dL — ABNORMAL HIGH (ref 0.3–1.2)
Total Bilirubin: 2.1 mg/dL — ABNORMAL HIGH (ref 0.3–1.2)
Total Protein: 6.1 g/dL — ABNORMAL LOW (ref 6.5–8.1)
Total Protein: 5.9 g/dL — ABNORMAL LOW (ref 6.5–8.1)

## 2020-10-30 LAB — POCT I-STAT EG7
Acid-base deficit: 12 mmol/L — ABNORMAL HIGH (ref 0.0–2.0)
Bicarbonate: 17.4 mmol/L — ABNORMAL LOW (ref 20.0–28.0)
Calcium, Ion: 0.97 mmol/L — ABNORMAL LOW (ref 1.15–1.40)
HCT: 34 % — ABNORMAL LOW (ref 39.0–52.0)
Hemoglobin: 11.6 g/dL — ABNORMAL LOW (ref 13.0–17.0)
O2 Saturation: 91 %
Patient temperature: 36
Potassium: 4.7 mmol/L (ref 3.5–5.1)
Sodium: 133 mmol/L — ABNORMAL LOW (ref 135–145)
TCO2: 19 mmol/L — ABNORMAL LOW (ref 22–32)
pCO2, Ven: 53.3 mmHg (ref 44.0–60.0)
pH, Ven: 7.116 — CL (ref 7.250–7.430)
pO2, Ven: 77 mmHg — ABNORMAL HIGH (ref 32.0–45.0)

## 2020-10-30 LAB — I-STAT CHEM 8, ED
BUN: 93 mg/dL — ABNORMAL HIGH (ref 8–23)
Calcium, Ion: 0.9 mmol/L — ABNORMAL LOW (ref 1.15–1.40)
Chloride: 96 mmol/L — ABNORMAL LOW (ref 98–111)
Creatinine, Ser: 5.8 mg/dL — ABNORMAL HIGH (ref 0.61–1.24)
Glucose, Bld: 113 mg/dL — ABNORMAL HIGH (ref 70–99)
HCT: 37 % — ABNORMAL LOW (ref 39.0–52.0)
Hemoglobin: 12.6 g/dL — ABNORMAL LOW (ref 13.0–17.0)
Potassium: 5.5 mmol/L — ABNORMAL HIGH (ref 3.5–5.1)
Sodium: 125 mmol/L — ABNORMAL LOW (ref 135–145)
TCO2: 16 mmol/L — ABNORMAL LOW (ref 22–32)

## 2020-10-30 LAB — CBC
HCT: 33.4 % — ABNORMAL LOW (ref 39.0–52.0)
Hemoglobin: 11.3 g/dL — ABNORMAL LOW (ref 13.0–17.0)
MCH: 35.6 pg — ABNORMAL HIGH (ref 26.0–34.0)
MCHC: 33.8 g/dL (ref 30.0–36.0)
MCV: 105.4 fL — ABNORMAL HIGH (ref 80.0–100.0)
Platelets: 122 10*3/uL — ABNORMAL LOW (ref 150–400)
RBC: 3.17 MIL/uL — ABNORMAL LOW (ref 4.22–5.81)
RDW: 14.1 % (ref 11.5–15.5)
WBC: 12.9 10*3/uL — ABNORMAL HIGH (ref 4.0–10.5)
nRBC: 0.2 % (ref 0.0–0.2)

## 2020-10-30 LAB — SURGICAL PCR SCREEN
MRSA, PCR: NEGATIVE
Staphylococcus aureus: POSITIVE — AB

## 2020-10-30 LAB — FOLATE: Folate: 13.6 ng/mL (ref >5.9)

## 2020-10-30 LAB — HEPATIC FUNCTION PANEL
ALT: 673 U/L — ABNORMAL HIGH (ref 0–44)
AST: 671 U/L — ABNORMAL HIGH (ref 15–41)
Albumin: 3.6 g/dL (ref 3.5–5.0)
Alkaline Phosphatase: 90 U/L (ref 38–126)
Bilirubin, Direct: 1.3 mg/dL — ABNORMAL HIGH (ref 0.0–0.2)
Indirect Bilirubin: 1.2 mg/dL — ABNORMAL HIGH (ref 0.3–0.9)
Total Bilirubin: 2.5 mg/dL — ABNORMAL HIGH (ref 0.3–1.2)
Total Protein: 6.1 g/dL — ABNORMAL LOW (ref 6.5–8.1)

## 2020-10-30 LAB — BASIC METABOLIC PANEL
Anion gap: 19 — ABNORMAL HIGH (ref 5–15)
BUN: 78 mg/dL — ABNORMAL HIGH (ref 8–23)
CO2: 15 mmol/L — ABNORMAL LOW (ref 22–32)
Calcium: 7 mg/dL — ABNORMAL LOW (ref 8.9–10.3)
Chloride: 100 mmol/L (ref 98–111)
Creatinine, Ser: 5.34 mg/dL — ABNORMAL HIGH (ref 0.61–1.24)
GFR, Estimated: 11 mL/min — ABNORMAL LOW (ref >60)
Glucose, Bld: 87 mg/dL (ref 70–99)
Potassium: 4.8 mmol/L (ref 3.5–5.1)
Sodium: 134 mmol/L — ABNORMAL LOW (ref 135–145)

## 2020-10-30 LAB — POCT I-STAT 7, (LYTES, BLD GAS, ICA,H+H)
Acid-base deficit: 14 mmol/L — ABNORMAL HIGH (ref 0.0–2.0)
Bicarbonate: 14.8 mmol/L — ABNORMAL LOW (ref 20.0–28.0)
Calcium, Ion: 0.97 mmol/L — ABNORMAL LOW (ref 1.15–1.40)
HCT: 31 % — ABNORMAL LOW (ref 39.0–52.0)
Hemoglobin: 10.5 g/dL — ABNORMAL LOW (ref 13.0–17.0)
O2 Saturation: 93 %
Patient temperature: 98.5
Potassium: 4.5 mmol/L (ref 3.5–5.1)
Sodium: 134 mmol/L — ABNORMAL LOW (ref 135–145)
TCO2: 16 mmol/L — ABNORMAL LOW (ref 22–32)
pCO2 arterial: 44.5 mmHg (ref 32.0–48.0)
pH, Arterial: 7.129 — CL (ref 7.350–7.450)
pO2, Arterial: 88 mmHg (ref 83.0–108.0)

## 2020-10-30 LAB — CBG MONITORING, ED
Glucose-Capillary: 124 mg/dL — ABNORMAL HIGH (ref 70–99)
Glucose-Capillary: 47 mg/dL — ABNORMAL LOW (ref 70–99)
Glucose-Capillary: 167 mg/dL — ABNORMAL HIGH (ref 70–99)

## 2020-10-30 LAB — MAGNESIUM: Magnesium: 2.4 mg/dL (ref 1.7–2.4)

## 2020-10-30 LAB — I-STAT ARTERIAL BLOOD GAS, ED
Acid-base deficit: 10 mmol/L — ABNORMAL HIGH (ref 0.0–2.0)
Bicarbonate: 15.9 mmol/L — ABNORMAL LOW (ref 20.0–28.0)
Calcium, Ion: 0.95 mmol/L — ABNORMAL LOW (ref 1.15–1.40)
HCT: 32 % — ABNORMAL LOW (ref 39.0–52.0)
Hemoglobin: 10.9 g/dL — ABNORMAL LOW (ref 13.0–17.0)
O2 Saturation: 88 %
Patient temperature: 97
Potassium: 4.7 mmol/L (ref 3.5–5.1)
Sodium: 130 mmol/L — ABNORMAL LOW (ref 135–145)
TCO2: 17 mmol/L — ABNORMAL LOW (ref 22–32)
pCO2 arterial: 34.6 mmHg (ref 32.0–48.0)
pH, Arterial: 7.266 — ABNORMAL LOW (ref 7.350–7.450)
pO2, Arterial: 58 mmHg — ABNORMAL LOW (ref 83.0–108.0)

## 2020-10-30 LAB — RESP PANEL BY RT-PCR (FLU A&B, COVID) ARPGX2
Influenza A by PCR: NEGATIVE
Influenza B by PCR: NEGATIVE
SARS Coronavirus 2 by RT PCR: NEGATIVE

## 2020-10-30 LAB — POCT I-STAT, CHEM 8
BUN: 87 mg/dL — ABNORMAL HIGH (ref 8–23)
BUN: 77 mg/dL — ABNORMAL HIGH (ref 8–23)
Calcium, Ion: 0.93 mmol/L — ABNORMAL LOW (ref 1.15–1.40)
Calcium, Ion: 0.97 mmol/L — ABNORMAL LOW (ref 1.15–1.40)
Chloride: 101 mmol/L (ref 98–111)
Chloride: 103 mmol/L (ref 98–111)
Creatinine, Ser: 5.1 mg/dL — ABNORMAL HIGH (ref 0.61–1.24)
Creatinine, Ser: 5.3 mg/dL — ABNORMAL HIGH (ref 0.61–1.24)
Glucose, Bld: 72 mg/dL (ref 70–99)
Glucose, Bld: 75 mg/dL (ref 70–99)
HCT: 32 % — ABNORMAL LOW (ref 39.0–52.0)
HCT: 30 % — ABNORMAL LOW (ref 39.0–52.0)
Hemoglobin: 10.9 g/dL — ABNORMAL LOW (ref 13.0–17.0)
Hemoglobin: 10.2 g/dL — ABNORMAL LOW (ref 13.0–17.0)
Potassium: 4.6 mmol/L (ref 3.5–5.1)
Potassium: 4.4 mmol/L (ref 3.5–5.1)
Sodium: 133 mmol/L — ABNORMAL LOW (ref 135–145)
Sodium: 135 mmol/L (ref 135–145)
TCO2: 21 mmol/L — ABNORMAL LOW (ref 22–32)
TCO2: 18 mmol/L — ABNORMAL LOW (ref 22–32)

## 2020-10-30 LAB — PROTIME-INR
INR: 1.4 — ABNORMAL HIGH (ref 0.8–1.2)
INR: 1.4 — ABNORMAL HIGH (ref 0.8–1.2)
Prothrombin Time: 17 seconds — ABNORMAL HIGH (ref 11.4–15.2)
Prothrombin Time: 17.5 seconds — ABNORMAL HIGH (ref 11.4–15.2)

## 2020-10-30 LAB — GLUCOSE, CAPILLARY
Glucose-Capillary: 108 mg/dL — ABNORMAL HIGH (ref 70–99)
Glucose-Capillary: 69 mg/dL — ABNORMAL LOW (ref 70–99)
Glucose-Capillary: 73 mg/dL (ref 70–99)
Glucose-Capillary: 83 mg/dL (ref 70–99)
Glucose-Capillary: 95 mg/dL (ref 70–99)

## 2020-10-30 LAB — BLOOD GAS, VENOUS
Acid-base deficit: 11.7 mmol/L — ABNORMAL HIGH (ref 0.0–2.0)
Bicarbonate: 15.5 mmol/L — ABNORMAL LOW (ref 20.0–28.0)
Drawn by: 1444
O2 Saturation: 59.6 %
Patient temperature: 37
pCO2, Ven: 46.1 mmHg (ref 44.0–60.0)
pH, Ven: 7.153 — CL (ref 7.250–7.430)
pO2, Ven: 38.4 mmHg (ref 32.0–45.0)

## 2020-10-30 LAB — RAPID URINE DRUG SCREEN, HOSP PERFORMED
Amphetamines: NOT DETECTED
Barbiturates: NOT DETECTED
Benzodiazepines: NOT DETECTED
Cocaine: NOT DETECTED
Opiates: POSITIVE — AB
Tetrahydrocannabinol: POSITIVE — AB

## 2020-10-30 LAB — CK
Total CK: 3265 U/L — ABNORMAL HIGH (ref 49–397)
Total CK: 3634 U/L — ABNORMAL HIGH (ref 49–397)
Total CK: 3216 U/L — ABNORMAL HIGH (ref 49–397)

## 2020-10-30 LAB — VITAMIN B12: Vitamin B-12: 2499 pg/mL — ABNORMAL HIGH (ref 180–914)

## 2020-10-30 LAB — TYPE AND SCREEN
ABO/RH(D): O NEG
Antibody Screen: NEGATIVE

## 2020-10-30 LAB — HIV ANTIBODY (ROUTINE TESTING W REFLEX): HIV Screen 4th Generation wRfx: NONREACTIVE

## 2020-10-30 LAB — ETHANOL: Alcohol, Ethyl (B): <10 mg/dL (ref <10)

## 2020-10-30 LAB — SODIUM, URINE, RANDOM: Sodium, Ur: 67 mmol/L

## 2020-10-30 LAB — GAMMA GT: GGT: 114 U/L — ABNORMAL HIGH (ref 7–50)

## 2020-10-30 LAB — CREATININE, URINE, RANDOM: Creatinine, Urine: 34.43 mg/dL

## 2020-10-30 LAB — PHOSPHORUS: Phosphorus: 7.5 mg/dL — ABNORMAL HIGH (ref 2.5–4.6)

## 2020-10-30 LAB — ABO/RH: ABO/RH(D): O NEG

## 2020-10-30 LAB — LACTIC ACID, PLASMA
Lactic Acid, Venous: 1.2 mmol/L (ref 0.5–1.9)
Lactic Acid, Venous: 1.1 mmol/L (ref 0.5–1.9)

## 2020-10-30 LAB — LIPASE, BLOOD: Lipase: 26 U/L (ref 11–51)

## 2020-10-30 SURGERY — IRRIGATION AND DEBRIDEMENT EXTREMITY
Anesthesia: General | Laterality: Left

## 2020-10-30 MED ORDER — PROPOFOL 10 MG/ML IV BOLUS
INTRAVENOUS | Status: DC | PRN
  Administered 2020-10-30: 70 mg via INTRAVENOUS

## 2020-10-30 MED ORDER — ONDANSETRON HCL 4 MG/2ML IJ SOLN: 4.0000 mg | Freq: Four times a day (QID) | INTRAMUSCULAR | Status: DC | PRN

## 2020-10-30 MED ORDER — LACTATED RINGERS IV SOLN: INTRAVENOUS | Status: DC

## 2020-10-30 MED ORDER — DILTIAZEM HCL-DEXTROSE 125-5 MG/125ML-% IV SOLN (PREMIX)
10.0000 mg/h | INTRAVENOUS | Status: DC
  Administered 2020-10-30: 10 mg/h via INTRAVENOUS
  Administered 2020-10-31: 15 mg/h via INTRAVENOUS
  Filled 2020-10-30 (×2): qty 125

## 2020-10-30 MED ORDER — DILTIAZEM LOAD VIA INFUSION
10.0000 mg | Freq: Once | INTRAVENOUS | Status: AC
  Administered 2020-10-30: 10 mg via INTRAVENOUS

## 2020-10-30 MED ORDER — SODIUM BICARBONATE 8.4 % IV SOLN
INTRAVENOUS | Status: AC
  Filled 2020-10-30: qty 50

## 2020-10-30 MED ORDER — SUCCINYLCHOLINE CHLORIDE 200 MG/10ML IV SOSY
PREFILLED_SYRINGE | INTRAVENOUS | Status: DC | PRN
  Administered 2020-10-30: 100 mg via INTRAVENOUS

## 2020-10-30 MED ORDER — SUGAMMADEX SODIUM 200 MG/2ML IV SOLN
INTRAVENOUS | Status: DC | PRN
  Administered 2020-10-30: 200 mg via INTRAVENOUS

## 2020-10-30 MED ORDER — SODIUM CHLORIDE 0.9 % IV SOLN: INTRAVENOUS | Status: DC

## 2020-10-30 MED ORDER — SODIUM CHLORIDE 0.9 % IR SOLN
Status: DC | PRN
  Administered 2020-10-30: 1000 mL

## 2020-10-30 MED ORDER — ONDANSETRON HCL 4 MG PO TABS: 4.0000 mg | ORAL_TABLET | Freq: Four times a day (QID) | ORAL | Status: DC | PRN

## 2020-10-30 MED ORDER — METOCLOPRAMIDE HCL 5 MG/ML IJ SOLN: 5.0000 mg | Freq: Three times a day (TID) | INTRAMUSCULAR | Status: DC | PRN

## 2020-10-30 MED ORDER — LORAZEPAM 1 MG PO TABS: 1.0000 mg | ORAL_TABLET | ORAL | Status: AC | PRN

## 2020-10-30 MED ORDER — ARTIFICIAL TEARS OPHTHALMIC OINT
TOPICAL_OINTMENT | OPHTHALMIC | Status: AC
  Filled 2020-10-30: qty 3.5

## 2020-10-30 MED ORDER — PHENYLEPHRINE 40 MCG/ML (10ML) SYRINGE FOR IV PUSH (FOR BLOOD PRESSURE SUPPORT)
PREFILLED_SYRINGE | INTRAVENOUS | Status: AC
  Filled 2020-10-30: qty 20

## 2020-10-30 MED ORDER — LORAZEPAM 2 MG/ML IJ SOLN
1.0000 mg | INTRAMUSCULAR | Status: AC | PRN
  Administered 2020-10-30: 1 mg via INTRAVENOUS
  Filled 2020-10-30: qty 1

## 2020-10-30 MED ORDER — ONDANSETRON HCL 4 MG/2ML IJ SOLN
INTRAMUSCULAR | Status: AC
  Filled 2020-10-30: qty 2

## 2020-10-30 MED ORDER — ACETAMINOPHEN 650 MG RE SUPP: 650.0000 mg | Freq: Four times a day (QID) | RECTAL | Status: DC | PRN

## 2020-10-30 MED ORDER — FENTANYL CITRATE (PF) 100 MCG/2ML IJ SOLN: 25.0000 ug | INTRAMUSCULAR | Status: DC | PRN

## 2020-10-30 MED ORDER — DEXTROSE 50 % IV SOLN
1.0000 | Freq: Once | INTRAVENOUS | Status: AC
  Administered 2020-10-30: 50 mL via INTRAVENOUS
  Filled 2020-10-30: qty 50

## 2020-10-30 MED ORDER — SODIUM CHLORIDE 0.9 % IV SOLN
3.0000 g | Freq: Two times a day (BID) | INTRAVENOUS | Status: DC
  Administered 2020-10-30 – 2020-10-31 (×2): 3 g via INTRAVENOUS
  Filled 2020-10-30: qty 3
  Filled 2020-10-30 (×3): qty 8

## 2020-10-30 MED ORDER — MIDAZOLAM HCL 2 MG/2ML IJ SOLN
INTRAMUSCULAR | Status: AC
  Filled 2020-10-30: qty 2

## 2020-10-30 MED ORDER — CLINDAMYCIN PHOSPHATE 900 MG/50ML IV SOLN
900.0000 mg | INTRAVENOUS | Status: AC
  Administered 2020-10-30: 900 mg via INTRAVENOUS
  Filled 2020-10-30: qty 50

## 2020-10-30 MED ORDER — DEXAMETHASONE SODIUM PHOSPHATE 10 MG/ML IJ SOLN
INTRAMUSCULAR | Status: AC
  Filled 2020-10-30: qty 1

## 2020-10-30 MED ORDER — SODIUM BICARBONATE 8.4 % IV SOLN
50.0000 meq | Freq: Once | INTRAVENOUS | Status: AC
  Administered 2020-10-30: 50 meq via INTRAVENOUS

## 2020-10-30 MED ORDER — SODIUM BICARBONATE 8.4 % IV SOLN
50.0000 meq | Freq: Once | INTRAVENOUS | Status: AC
  Administered 2020-10-30: 50 meq via INTRAVENOUS
  Filled 2020-10-30: qty 50

## 2020-10-30 MED ORDER — THIAMINE HCL 100 MG PO TABS
100.0000 mg | ORAL_TABLET | Freq: Every day | ORAL | Status: DC
  Administered 2020-11-01 – 2020-11-11 (×10): 100 mg via ORAL
  Filled 2020-10-30 (×10): qty 1

## 2020-10-30 MED ORDER — LIDOCAINE 2% (20 MG/ML) 5 ML SYRINGE
INTRAMUSCULAR | Status: DC | PRN
  Administered 2020-10-30: 20 mg via INTRAVENOUS

## 2020-10-30 MED ORDER — SODIUM CHLORIDE 0.9 % IV BOLUS
1000.0000 mL | Freq: Once | INTRAVENOUS | Status: AC
  Administered 2020-10-30: 1000 mL via INTRAVENOUS

## 2020-10-30 MED ORDER — CHLORHEXIDINE GLUCONATE 0.12 % MT SOLN: 15.0000 mL | Freq: Once | OROMUCOSAL | Status: DC

## 2020-10-30 MED ORDER — FOLIC ACID 1 MG PO TABS: 1.0000 mg | ORAL_TABLET | Freq: Every day | ORAL | Status: DC

## 2020-10-30 MED ORDER — DOCUSATE SODIUM 100 MG PO CAPS: 100.0000 mg | ORAL_CAPSULE | Freq: Two times a day (BID) | ORAL | Status: DC

## 2020-10-30 MED ORDER — THIAMINE HCL 100 MG/ML IJ SOLN
100.0000 mg | Freq: Once | INTRAMUSCULAR | Status: AC
  Administered 2020-10-30: 100 mg via INTRAVENOUS
  Filled 2020-10-30: qty 2

## 2020-10-30 MED ORDER — POVIDONE-IODINE 10 % EX SWAB: 2.0000 "application " | Freq: Once | CUTANEOUS | Status: DC

## 2020-10-30 MED ORDER — LIDOCAINE 2% (20 MG/ML) 5 ML SYRINGE
INTRAMUSCULAR | Status: AC
  Filled 2020-10-30: qty 5

## 2020-10-30 MED ORDER — SODIUM BICARBONATE 8.4 % IV SOLN
INTRAVENOUS | Status: AC
  Filled 2020-10-30: qty 1000

## 2020-10-30 MED ORDER — FENTANYL CITRATE (PF) 250 MCG/5ML IJ SOLN
INTRAMUSCULAR | Status: AC
  Filled 2020-10-30: qty 5

## 2020-10-30 MED ORDER — PHENYLEPHRINE 40 MCG/ML (10ML) SYRINGE FOR IV PUSH (FOR BLOOD PRESSURE SUPPORT)
PREFILLED_SYRINGE | INTRAVENOUS | Status: DC | PRN
  Administered 2020-10-30: 120 ug via INTRAVENOUS
  Administered 2020-10-30: 80 ug via INTRAVENOUS
  Administered 2020-10-30: 160 ug via INTRAVENOUS
  Administered 2020-10-30: 120 ug via INTRAVENOUS

## 2020-10-30 MED ORDER — CHLORHEXIDINE GLUCONATE CLOTH 2 % EX PADS: 6.0000 | MEDICATED_PAD | Freq: Every day | CUTANEOUS | Status: DC

## 2020-10-30 MED ORDER — ONDANSETRON HCL 4 MG/2ML IJ SOLN
INTRAMUSCULAR | Status: DC | PRN
  Administered 2020-10-30: 4 mg via INTRAVENOUS

## 2020-10-30 MED ORDER — ADULT MULTIVITAMIN W/MINERALS CH: 1.0000 | ORAL_TABLET | Freq: Every day | ORAL | Status: DC

## 2020-10-30 MED ORDER — SODIUM CHLORIDE 0.9 % IV BOLUS
500.0000 mL | Freq: Once | INTRAVENOUS | Status: AC
  Administered 2020-10-30: 500 mL via INTRAVENOUS

## 2020-10-30 MED ORDER — SODIUM CHLORIDE 0.9% FLUSH
3.0000 mL | Freq: Two times a day (BID) | INTRAVENOUS | Status: DC
  Administered 2020-10-30 – 2020-11-11 (×22): 3 mL via INTRAVENOUS

## 2020-10-30 MED ORDER — THIAMINE HCL 100 MG/ML IJ SOLN
100.0000 mg | Freq: Every day | INTRAMUSCULAR | Status: DC
  Administered 2020-10-31: 100 mg via INTRAVENOUS
  Filled 2020-10-30 (×2): qty 2

## 2020-10-30 MED ORDER — METOCLOPRAMIDE HCL 5 MG PO TABS
5.0000 mg | ORAL_TABLET | Freq: Three times a day (TID) | ORAL | Status: DC | PRN
  Filled 2020-10-30: qty 2

## 2020-10-30 MED ORDER — DEXAMETHASONE SODIUM PHOSPHATE 10 MG/ML IJ SOLN
INTRAMUSCULAR | Status: DC | PRN
  Administered 2020-10-30: 10 mg via INTRAVENOUS

## 2020-10-30 MED ORDER — ESMOLOL HCL 100 MG/10ML IV SOLN
INTRAVENOUS | Status: DC | PRN
  Administered 2020-10-30 (×2): 30 mg via INTRAVENOUS
  Administered 2020-10-30: 10 mg via INTRAVENOUS
  Administered 2020-10-30: 30 mg via INTRAVENOUS

## 2020-10-30 MED ORDER — PROPOFOL 10 MG/ML IV BOLUS
INTRAVENOUS | Status: AC
  Filled 2020-10-30: qty 20

## 2020-10-30 MED ORDER — CEFAZOLIN SODIUM-DEXTROSE 2-4 GM/100ML-% IV SOLN
2.0000 g | INTRAVENOUS | Status: AC
  Administered 2020-10-30: 2 g via INTRAVENOUS

## 2020-10-30 MED ORDER — ALBUMIN HUMAN 5 % IV SOLN: INTRAVENOUS | Status: DC | PRN

## 2020-10-30 MED ORDER — ROCURONIUM BROMIDE 10 MG/ML (PF) SYRINGE
PREFILLED_SYRINGE | INTRAVENOUS | Status: DC | PRN
  Administered 2020-10-30: 30 mg via INTRAVENOUS

## 2020-10-30 MED ORDER — FENTANYL CITRATE (PF) 250 MCG/5ML IJ SOLN
INTRAMUSCULAR | Status: DC | PRN
  Administered 2020-10-30: 100 ug via INTRAVENOUS

## 2020-10-30 MED ORDER — ESMOLOL HCL 100 MG/10ML IV SOLN
INTRAVENOUS | Status: AC
  Filled 2020-10-30: qty 20

## 2020-10-30 MED ORDER — TETANUS-DIPHTH-ACELL PERTUSSIS 5-2.5-18.5 LF-MCG/0.5 IM SUSY
0.5000 mL | PREFILLED_SYRINGE | Freq: Once | INTRAMUSCULAR | Status: AC
  Administered 2020-10-30: 0.5 mL via INTRAMUSCULAR
  Filled 2020-10-30: qty 0.5

## 2020-10-30 MED ORDER — PHENYLEPHRINE HCL-NACL 10-0.9 MG/250ML-% IV SOLN
INTRAVENOUS | Status: DC | PRN
  Administered 2020-10-30: 50 ug/min via INTRAVENOUS

## 2020-10-30 MED ORDER — CHLORHEXIDINE GLUCONATE 4 % EX LIQD: 60.0000 mL | Freq: Once | CUTANEOUS | Status: DC

## 2020-10-30 MED ORDER — CEFAZOLIN SODIUM-DEXTROSE 2-4 GM/100ML-% IV SOLN
INTRAVENOUS | Status: AC
  Filled 2020-10-30: qty 100

## 2020-10-30 MED ORDER — SODIUM CHLORIDE 0.9 % IV SOLN: INTRAVENOUS | Status: DC | PRN

## 2020-10-30 MED ORDER — CEFAZOLIN SODIUM-DEXTROSE 1-4 GM/50ML-% IV SOLN
1.0000 g | Freq: Two times a day (BID) | INTRAVENOUS | Status: DC
  Administered 2020-10-31: 1 g via INTRAVENOUS
  Filled 2020-10-30: qty 50

## 2020-10-30 MED ORDER — SUCCINYLCHOLINE CHLORIDE 200 MG/10ML IV SOSY
PREFILLED_SYRINGE | INTRAVENOUS | Status: AC
  Filled 2020-10-30: qty 10

## 2020-10-30 MED ORDER — ROCURONIUM BROMIDE 10 MG/ML (PF) SYRINGE
PREFILLED_SYRINGE | INTRAVENOUS | Status: AC
  Filled 2020-10-30: qty 10

## 2020-10-30 MED ORDER — SODIUM BICARBONATE 650 MG PO TABS: 650.0000 mg | ORAL_TABLET | Freq: Two times a day (BID) | ORAL | Status: DC

## 2020-10-30 MED ORDER — ACETAMINOPHEN 325 MG PO TABS
650.0000 mg | ORAL_TABLET | Freq: Four times a day (QID) | ORAL | Status: DC | PRN
  Administered 2020-11-05: 650 mg via ORAL
  Filled 2020-10-30: qty 2

## 2020-10-30 MED ORDER — ORAL CARE MOUTH RINSE: 15.0000 mL | Freq: Once | OROMUCOSAL | Status: DC

## 2020-10-30 SURGICAL SUPPLY — 78 items
BAG COUNTER SPONGE SURGICOUNT (BAG) ×2 IMPLANT
BANDAGE ESMARK 6X9 LF (GAUZE/BANDAGES/DRESSINGS) ×1 IMPLANT
BAR GLASS FIBER EXFX 11X350 (EXFIX) ×4 IMPLANT
BLADE SURG 10 STRL SS (BLADE) ×2 IMPLANT
BNDG COHESIVE 4X5 TAN STRL (GAUZE/BANDAGES/DRESSINGS) ×2 IMPLANT
BNDG COHESIVE 6X5 TAN STRL LF (GAUZE/BANDAGES/DRESSINGS) ×2 IMPLANT
BNDG CONFORM 3 STRL LF (GAUZE/BANDAGES/DRESSINGS) IMPLANT
BNDG ELASTIC 3X5.8 VLCR STR LF (GAUZE/BANDAGES/DRESSINGS) IMPLANT
BNDG ELASTIC 4X5.8 VLCR STR LF (GAUZE/BANDAGES/DRESSINGS) ×2 IMPLANT
BNDG ELASTIC 6X15 VLCR STRL LF (GAUZE/BANDAGES/DRESSINGS) ×2 IMPLANT
BNDG ELASTIC 6X5.8 VLCR STR LF (GAUZE/BANDAGES/DRESSINGS) ×2 IMPLANT
BNDG ESMARK 4X9 LF (GAUZE/BANDAGES/DRESSINGS) ×2 IMPLANT
BNDG ESMARK 6X9 LF (GAUZE/BANDAGES/DRESSINGS) ×2
BNDG GAUZE ELAST 4 BULKY (GAUZE/BANDAGES/DRESSINGS) ×2 IMPLANT
CHLORAPREP W/TINT 26 (MISCELLANEOUS) ×2 IMPLANT
CLAMP BLUE BAR TO PIN (EXFIX) ×4 IMPLANT
CLEANER TIP ELECTROSURG 2X2 (MISCELLANEOUS) ×2 IMPLANT
COVER SURGICAL LIGHT HANDLE (MISCELLANEOUS) ×2 IMPLANT
CUFF TOURN SGL QUICK 18X4 (TOURNIQUET CUFF) IMPLANT
CUFF TOURN SGL QUICK 24 (TOURNIQUET CUFF)
CUFF TOURN SGL QUICK 34 (TOURNIQUET CUFF)
CUFF TOURN SGL QUICK 42 (TOURNIQUET CUFF) IMPLANT
CUFF TRNQT CYL 24X4X16.5-23 (TOURNIQUET CUFF) IMPLANT
CUFF TRNQT CYL 34X4.125X (TOURNIQUET CUFF) IMPLANT
DRAPE C-ARM 42X72 X-RAY (DRAPES) IMPLANT
DRAPE SURG 17X23 STRL (DRAPES) ×2 IMPLANT
DRAPE U-SHAPE 47X51 STRL (DRAPES) ×2 IMPLANT
DRSG ADAPTIC 3X8 NADH LF (GAUZE/BANDAGES/DRESSINGS) ×2 IMPLANT
DRSG PAD ABDOMINAL 8X10 ST (GAUZE/BANDAGES/DRESSINGS) ×4 IMPLANT
DRSG XEROFORM 1X8 (GAUZE/BANDAGES/DRESSINGS) ×2 IMPLANT
DURAPREP 26ML APPLICATOR (WOUND CARE) ×2 IMPLANT
ELECT REM PT RETURN 9FT ADLT (ELECTROSURGICAL) ×2
ELECTRODE REM PT RTRN 9FT ADLT (ELECTROSURGICAL) ×1 IMPLANT
EVACUATOR 1/8 PVC DRAIN (DRAIN) IMPLANT
GAUZE SPONGE 4X4 12PLY STRL (GAUZE/BANDAGES/DRESSINGS) ×2 IMPLANT
GAUZE XEROFORM 5X9 LF (GAUZE/BANDAGES/DRESSINGS) IMPLANT
GLOVE SRG 8 PF TXTR STRL LF DI (GLOVE) ×1 IMPLANT
GLOVE SURG ENC MOIS LTX SZ7 (GLOVE) ×2 IMPLANT
GLOVE SURG ENC MOIS LTX SZ7.5 (GLOVE) ×2 IMPLANT
GLOVE SURG ORTHO LTX SZ7.5 (GLOVE) ×2 IMPLANT
GLOVE SURG ORTHO LTX SZ8 (GLOVE) ×4 IMPLANT
GLOVE SURG UNDER POLY LF SZ8 (GLOVE) ×1
GOWN STRL REUS W/ TWL LRG LVL3 (GOWN DISPOSABLE) ×3 IMPLANT
GOWN STRL REUS W/TWL LRG LVL3 (GOWN DISPOSABLE) ×3
HALF PIN 5.0X160 (EXFIX) ×4 IMPLANT
HANDPIECE INTERPULSE COAX TIP (DISPOSABLE)
KIT BASIN OR (CUSTOM PROCEDURE TRAY) ×2 IMPLANT
KIT TURNOVER KIT B (KITS) ×2 IMPLANT
MANIFOLD NEPTUNE II (INSTRUMENTS) ×2 IMPLANT
NEEDLE 22X1 1/2 (OR ONLY) (NEEDLE) IMPLANT
NS IRRIG 1000ML POUR BTL (IV SOLUTION) ×2 IMPLANT
PACK ORTHO EXTREMITY (CUSTOM PROCEDURE TRAY) ×2 IMPLANT
PAD ARMBOARD 7.5X6 YLW CONV (MISCELLANEOUS) ×4 IMPLANT
PADDING CAST COTTON 6X4 STRL (CAST SUPPLIES) ×6 IMPLANT
PIN CLAMP 2BAR 75MM BLUE (EXFIX) ×2 IMPLANT
PIN TRANSFIXING 5.0 (EXFIX) ×2 IMPLANT
SET HNDPC FAN SPRY TIP SCT (DISPOSABLE) IMPLANT
SPONGE T-LAP 18X18 ~~LOC~~+RFID (SPONGE) ×4 IMPLANT
STAPLER VISISTAT 35W (STAPLE) ×2 IMPLANT
STOCKINETTE IMPERVIOUS 9X36 MD (GAUZE/BANDAGES/DRESSINGS) ×2 IMPLANT
STOCKINETTE IMPERVIOUS LG (DRAPES) ×2 IMPLANT
SUCTION FRAZIER HANDLE 10FR (MISCELLANEOUS)
SUCTION TUBE FRAZIER 10FR DISP (MISCELLANEOUS) IMPLANT
SUT ETHILON 2 0 FS 18 (SUTURE) ×2 IMPLANT
SUT ETHILON 3 0 PS 1 (SUTURE) IMPLANT
SUT PROLENE 3 0 PS 2 (SUTURE) IMPLANT
SUT VIC AB 0 CT1 27 (SUTURE) ×2
SUT VIC AB 0 CT1 27XBRD ANBCTR (SUTURE) ×2 IMPLANT
SUT VIC AB 2-0 CT1 27 (SUTURE) ×2
SUT VIC AB 2-0 CT1 TAPERPNT 27 (SUTURE) ×2 IMPLANT
SWAB CULTURE ESWAB REG 1ML (MISCELLANEOUS) IMPLANT
SYR CONTROL 10ML LL (SYRINGE) IMPLANT
TOWEL GREEN STERILE (TOWEL DISPOSABLE) ×4 IMPLANT
TOWEL GREEN STERILE FF (TOWEL DISPOSABLE) ×4 IMPLANT
TUBE CONNECTING 12X1/4 (SUCTIONS) ×2 IMPLANT
UNDERPAD 30X36 HEAVY ABSORB (UNDERPADS AND DIAPERS) ×2 IMPLANT
WATER STERILE IRR 1000ML POUR (IV SOLUTION) ×4 IMPLANT
YANKAUER SUCT BULB TIP NO VENT (SUCTIONS) ×2 IMPLANT

## 2020-10-30 NOTE — Progress Notes (Signed)
I was called this morning with concern for ischemic left foot after distal tib-fib fracture.  Please see my earlier consult note.  Patient seen in recovery after Ex-Fix by orthopedic surgery.  He has a brisk PT signal at the ankle that appears biphasic.  This correlates with his arterial duplex that shows inline flow down the left lower extremity to the foot through the PT.  Vascular surgery will sign off.  Please call if is any questions or concerns.  Marty Heck, MD [facility] Office: [place]
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
## 2020-10-30 NOTE — ED Notes (Signed)
Pt informed me he fell down the stairs at a party and hurt his left foot. He admits to being in pain but unable to score the pain.

## 2020-10-30 NOTE — Progress Notes (Addendum)
Post op evaluation  Summary: 70 year old male with hypertension, hyperlipidemia, ischemic vascular disease, and alcohol use disorder who was admitted this morning for surgical management of a tib-fib fx. He was also found to be in acute renal failure secondary to rhabdomyolysis complicated by metabolic acidosis (pH 7.2), acute liver injury, pneumonitis vs pneumonia, and acute mentation changes.  Paged by Dr. Glennon Andrade, anesthesia, to provide update on pt's condition. -He converted into afib in the OR and has been placed on a diltiazem drip. He has received two boluses of this and drip rate has been increased to 20mg /hr.  -repeat ABG shows pH 7.129. received multiple amps of bicarb -UOP ~1500cc -received 1L of fluids intraop  I evaluated pt at bedside.  General: acutely and chronically ill appearing Cardiac: tachycardic, regular rhythm. Peripheral pulses are intact. Extremities are warm. No lower extremity edema Pulm: breathing comfortably on 6L [place]. Rhonchi throughout Neuro: lethargic. Awakens to voice. Answers questions appropriately. Does not follow commands consistently. Tremulous Extremities: external fixation device present on the LLE.    Assessment/Plan  Acute non-oliguric renal failure complicated by metabolic acidosis secondary to moderate rhabdomyolysis (McMahon score 14.5) Fortunately, he is having reasonable urine output and he is not appearing volume overloaded. Hyperkalemia has resolved.  -repeat BMP, CK, VBG -increase fluid infusion rate to 300cc/hr. Titrate up to a UOP goal of 200-300cc/hr. Record UOP q2h, strict I/O -start bicarb gtt at 75cc/hr. Currently NPO so can not take oral bicarb replacement ordered by nephrology. Monitor calcium closely -avoid hypotension -repeat labs in 4 hours  Acute atrial fib/flutter with RVR. Not present on admission. Likely a consequence of acute illness.  -wean diltiazem gtt as able -will hold off on anticoagulation for tonight since he just  got out of the OR. If he remains in afib tomorrow, would discuss timing to start anticoagulation with ortho  Acute liver injury. Likely multifactorial including alcohol use and rhabdomyolysis.  -check viral hepatitis panel -repeat LFTs -check GGT -monitor PT/INR  Small subdural hematoma vs hygroma. Small mass effect to the right lateral ventricle but no midline shift.  Age indeterminate occipital and cerebellar infarcts -doubt MRI will feasible now with external fixator in place  Hypoglycemia. Marland Kitchen -CBGs q4h. Prn d50 amps  Acute hypoxic respiratory failure secondary to pneumonia vs pneumonitis -continue empiric treatment with unasyn   Acute metabolic encephalopathy. Can not rule out acute vascular event. -f/u MRI -NPO until SLP eval  Alcohol withdrawal. Now with increased agitation and tremors.  -CIWA with ativan  Tib/Fib Fx. Management per ortho  Addendum: 4:06 AM Paged by Austin Andrade, bedside RN, for acute drop in HR to 59. She also notes that his mental status has declined. Instructed to d/c diltiazem drip and obtain EKG.  Pt seen at bedside. Blood pressure (!) 89/51, pulse 66, temperature 98.5 F (36.9 C), resp. rate 13, height 5\' 8"  (1.727 m), weight 56.7 kg, SpO2 92 %.  Cardiac: RRR, no lower extremity edema. Extremities are warm Pulm: O2 saturations in the low to mid 90s in 4L [place]. Intermittent apneic periods. No significant change in lungs sounds from prior.  Neuro: does not awaken to voice or sternal rub. Miotic pupils reactive to light. No obvious facial droop. poor gag reflex. Localizes to pain in all extremities.  Occasional myotonic jerks but no otherwise not rigid.   Repeat EKG shows he converted back into sinus rhythm.   RN notes that he has only had 150cc out since 23:30.   Repeat labs show pH unchanged at  7.15; bicarb 16, CO2 47. Lactate normal. CK down 32k>23k. Renal function essentially unchanged. Platelets down to 93.   Assessment/Plan: he is hemodynamically stable  at this time however the significant neuro changes are concerning, in addition to minimal urine output and metabolic acidosis. Question if his renal failure is precluding anesthetic metabolization. May also consider worsening of the subdural bleed noted on CT from admission. I am concerned about airway protect given his mental status and for respiratory decompensation. Discussed with Dr. Mariane Andrade from critical care. Will transfer care over to ICU at this time.  Attempted to contact Austin Andrade from contact list. No answer at this time.    Austin Hansen, MD Internal Medicine Resident PGY-3 [facility] Andrade [facility] Residency Pager: [phone] 10/31/2020 4:06 AM

## 2020-10-30 NOTE — Transfer of Care (Signed)
Immediate Anesthesia Transfer of Care Note  Patient: Omarion Minnehan  Procedure(s) Performed: IRRIGATION AND DEBRIDEMENT LEFT ANKLE (Left) EXTERNAL FIXATION LEFT ANKLE (Left)  Patient Location: PACU  Anesthesia Type:MAC  Level of Consciousness: awake  Airway & Oxygen Therapy: Patient Spontanous Breathing and Patient connected to face mask oxygen  Post-op Assessment: Report given to RN and Post -op Vital signs reviewed and stable  Post vital signs: Reviewed and stable  Last Vitals:  Vitals Value Taken Time  BP 87/55 10/30/20 1947  Temp 36.9 C 10/30/20 1923  Pulse 106 10/30/20 1958  Resp 16 10/30/20 1958  SpO2 92 % 10/30/20 1958  Vitals shown include unvalidated device data.  Last Pain:  Vitals:   10/30/20 1552  TempSrc:   PainSc: 8       Patients Stated Pain Goal: 3 (71/85/50 1586)  Complications: No notable events documented.
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
## 2020-10-30 NOTE — Progress Notes (Signed)
Lower extremity arterial LT study completed.  Preliminary results relayed to [place], DO.  See CV Proc for preliminary results report.   Darlin Coco, RDMS, RVT

## 2020-10-30 NOTE — ED Notes (Signed)
Transported to vascular lab

## 2020-10-30 NOTE — Progress Notes (Signed)
SLP Cancellation Note  Patient Details Name: Austin Andrade MRN: [id number] DOB: 1951/02/25   Cancelled treatment:       Reason Eval/Treat Not Completed: Medical issues which prohibited therapy (Pt's case was discussed with RN who indicated that the pt is currently NPO for surgery. Swallow eval will therefore be deferred.)  Curren Mohrmann I. Hardin Negus, [place], [place] Office number [phone] Pager [place] 10/30/2020, 4:33 PM

## 2020-10-30 NOTE — ED Notes (Signed)
I was informed via Short Stay that pt CBG 52. I went to MRI to verify. Pt CBG 47. Notified the EDP and got an order for dextrose. Transported pt back to room. Informed MRI once pt CBG is managed I will contact them to transport back.

## 2020-10-30 NOTE — Op Note (Signed)
Procedure(s): IRRIGATION AND DEBRIDEMENT LEFT ANKLE EXTERNAL FIXATION LEFT ANKLE Procedure Note  Austin Andrade male 70 y.o. 10/30/2020   Preoperative diagnosis: Left grade 1 open distal tibia/fibula fracture  Postoperative diagnosis: Same  Procedure(s) and Anesthesia Type:    * IRRIGATION AND DEBRIDEMENT LEFT ANKLE - General    * EXTERNAL FIXATION LEFT ANKLE - General  Surgeon(s) and Role:    Tania Ade, MD - Primary   Indications:  70 y.o. male s/p fall with comminuted left distal tibia/fibular fracture with type I open extension.  Indicated for external fixation to stabilize the fracture and allow soft tissue rest until definitive fixation can be carried out.   Surgeon: Isabella Stalling   Assistants: Sheryle Hail, PA-C  Anesthesia: General endotracheal anesthesia     Procedure Detail  IRRIGATION AND DEBRIDEMENT LEFT ANKLE, EXTERNAL FIXATION LEFT ANKLE  Findings: Grade 1 open fracture, open wounds were extended for irrigation and debridement.  Delta frame external fixation was applied.  Estimated Blood Loss:  less than 50 mL         Drains: none  Blood Given: none         Specimens: none        Complications:  * No complications entered in OR log *         Disposition: PACU - hemodynamically stable.         Condition: stable    Procedure:  The patient was identified in the preoperative  holding area where I personally marked the operative site after  verifying site side and procedure with the patient. The patient was taken back  to the operating room where general anesthesia was induced without  Complication. The patient was placed in supine position.  Left lower extremity was prepped and draped in standard sterile fashion.  After the appropriate timeout procedure the small type I open wounds were extended sharply to allow more formal irrigation.  Copious irrigation was used in both small poke holes.  These were subsequently closed with 2-0  nylon sutures loosely.  The tibial external fixation pins were then placed and position was verified with fluoroscopy.  The transcalcaneal pin was then placed in the external fixation was applied.  Fluoroscopy was used to ensure appropriate reduction and the external fixation was tightened into place.  A light sterile dressing was applied and the patient was then addressed by anesthesia.  The plan was to attempt to extubate, but they felt he may need to remain intubated based on his respiratory acidosis.  Postoperative plan: He will be readmitted to the medicine service for management of his complex medical issues.  We will follow along and ultimately the orthopedic trauma service will be consulted for additional management of this complex fracture.

## 2020-10-30 NOTE — Progress Notes (Signed)
Orthopedic Tech Progress Note Patient Details:  Austin Andrade December 28, 1950 [id number]  Ortho Devices Type of Ortho Device: Post (short leg) splint, Stirrup splint Ortho Device/Splint Location: LLE Ortho Device/Splint Interventions: Ordered, Application, Adjustment   Post Interventions Patient Tolerated: Fair Instructions Provided: Other (comment)  [name] 10/30/2020, 7:02 AM
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
## 2020-10-30 NOTE — Anesthesia Preprocedure Evaluation (Addendum)
Anesthesia Evaluation  Patient identified by MRN, date of birth, ID band Patient confused    Reviewed: Allergy & Precautions, Patient's Chart, lab work & pertinent test results  Airway Mallampati: III  TM Distance: >3 FB Neck ROM: Limited    Dental  (+) Poor Dentition, Dental Advisory Given   Pulmonary Current Smoker,     + decreased breath sounds      Cardiovascular hypertension, +CHF   Rhythm:Irregular Rate:Tachycardia  Echo:  1. Technically difficult study with limited views  2. Left ventricular ejection fraction, by visual estimation, is grossly  60 to 65%. The left ventricle has normal function.  3. Global right ventricle has normal systolic function.The right  ventricular size is normal.  4. Left atrial size was normal.  5. Right atrial size was normal.  6. The mitral valve is normal in structure. No evidence of mitral valve  regurgitation.  7. The tricuspid valve is grossly normal. Tricuspid valve regurgitation  is trivial.  8. The pulmonic valve was not well visualized. Pulmonic valve  regurgitation is not visualized.  9. The aortic valve was not well visualized. Aortic valve regurgitation  is moderate. No evidence of aortic valve stenosis.  10. Normal pulmonary artery systolic pressure.    Neuro/Psych PSYCHIATRIC DISORDERS CVA    GI/Hepatic Neg liver ROS, GERD  ,  Endo/Other  negative endocrine ROS  Renal/GU      Musculoskeletal negative musculoskeletal ROS (+)   Abdominal Normal abdominal exam  (+)   Peds  Hematology negative hematology ROS (+)   Anesthesia Other Findings   Reproductive/Obstetrics                           Anesthesia Physical Anesthesia Plan  ASA: 3 and emergent  Anesthesia Plan: General   Post-op Pain Management:    Induction: Intravenous, Cricoid pressure planned and Rapid sequence  PONV Risk Score and Plan: 2 and Ondansetron and  Dexamethasone  Airway Management Planned: Oral ETT  Additional Equipment: None  Intra-op Plan:   Post-operative Plan: Possible Post-op intubation/ventilation  Informed Consent: I have reviewed the patients History and Physical, chart, labs and discussed the procedure including the risks, benefits and alternatives for the proposed anesthesia with the patient or authorized representative who has indicated his/her understanding and acceptance.     Dental advisory given, History available from chart only and Only emergency history available  Plan Discussed with: CRNA  Anesthesia Plan Comments: (Attempted to call friend x2. No answer. Pt intermittently coherent and express understanding.   Paged cardiology for consult in preop. Will follow up once patient enters PACU for further cardiac workup. Will administer beta blockers intraoperatively for HR control. Pt may be taking Plavix at present time. )     Anesthesia Quick Evaluation

## 2020-10-30 NOTE — ED Notes (Signed)
Transported to CT 

## 2020-10-30 NOTE — Anesthesia Postprocedure Evaluation (Signed)
Anesthesia Post Note  Patient: Austin Andrade  Procedure(s) Performed: IRRIGATION AND DEBRIDEMENT LEFT ANKLE (Left) EXTERNAL FIXATION LEFT ANKLE (Left)     Patient location during evaluation: PACU Anesthesia Type: General Level of consciousness: patient cooperative, oriented and sedated Pain management: pain level controlled Vital Signs Assessment: post-procedure vital signs reviewed and stable Respiratory status: spontaneous breathing, nonlabored ventilation, respiratory function stable and patient connected to face mask oxygen Cardiovascular status: blood pressure returned to baseline and stable (requiring Cardizem for Afib/rate control) Postop Assessment: no apparent nausea or vomiting Anesthetic complications: no Comments: Discussed with Dr. Darrick Meigs, Internal Medicine, who will come to PACU to eval patient   Jenita Seashore, MD   No notable events documented.  Last Vitals:  Vitals:   10/30/20 2000 10/30/20 2015  BP: (!) 92/48 103/77  Pulse: 75 71  Resp: 13 13  Temp:    SpO2: 100% 95%    Last Pain:  Vitals:   10/30/20 2000  TempSrc:   PainSc: Asleep                 Mariyam Remington,E. Starlena Beil

## 2020-10-30 NOTE — Consult Note (Signed)
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 hypertension, saccular aneurysm, hyperlipidemia, hypertension and alcohol use disorder admitted for acute encephalopathy.   Acute kidney injury, moderate rhabdomyolysis: AKI is likely multifactorial in setting of rhabdomyolysis vs pre-renal as patient appears hypovolemic on exam. Patient also endorses symptoms of BPH for at least past year. Will obtain bladder US. Urinalysis and urine studies pending. Renal US pending. Will have foley catheter for strict I&O and relief of any bladder obstruction. Initially had hyperkalemia that has improved. No emergent need for CRRT at this time. Will continue with fluid resuscitation and trend renal function.  Anion gap metabolic acidosis: likely secondary to AKI/uremia.  Patient given one amp of sodium bicarb. Continue with sodium bicarb tablet 650mg  bid  Hyponatremia: likely hypovolemic hyponatremia; improving with fluid resuscitation as above. Will continue  to trend. Goal correction 6-8 mEq/L over next 24hrs.  Acute liver injury: Transaminitis with hyperbilirubinemia. Hx of alcohol use disorder; suspect this may be contributing to acute liver injury. Hepatitis panel and RUQ [place] pending. May be contributing to his encephalopathy. Consider adding lactulose.  Macrocytic anemia: likely due to alcohol use disorder; f/u vitamin B12 and folate levels  Thrombocytopenia: likely secondary to acute liver injury and acute infection as above. Will continue to trend.  CAP vs aspiration pneumonia: Started empirically on IV unasyn  Left distal tibia comminuted fracture: management per orthopedic surgery Alcohol use disorder: CIWA w/Ativan, thiamine, folic acid, multivitamin  Harvie Heck, MD Internal Medicine, PGY-3 10/30/20 3:28 PM Pager # [phone]

## 2020-10-30 NOTE — ED Provider Notes (Addendum)
Signed out to me by previous provider.  Please refer to their note for full HPI.  Briefly this is a 70 year old male who was found confused and altered, down for unknown time.  Initially hypoglycemic with EMS.  Obvious left ankle deformity consistent with an open fracture that was reduced and splinted by previous provider, concern for DP pulses.  Patient appeared confused/intoxicated on arrival but is otherwise alert and oriented with stable vitals.  Alcohol level is negative, fingerstick was appropriate on arrival.  Patient is pending metabolic work-up and imaging. Physical Exam  BP 109/61   Pulse 91   Temp 98.5 F (36.9 C) (Oral)   Resp 10   Ht 5\' 8"  (1.727 m)   Wt 56.7 kg   SpO2 94%   BMI 19.01 kg/m   Physical Exam Vitals and nursing note reviewed.  Constitutional:      Appearance: Normal appearance. He is ill-appearing.  HENT:     Head: Normocephalic.     Mouth/Throat:     Mouth: Mucous membranes are moist.  Cardiovascular:     Rate and Rhythm: Normal rate.  Pulmonary:     Effort: Pulmonary effort is normal. No respiratory distress.  Abdominal:     Palpations: Abdomen is soft.     Tenderness: There is no abdominal tenderness.  Skin:    General: Skin is warm.  Neurological:     Mental Status: He is alert and oriented to person, place, and time.     Comments: Slurred speech, oriented, follows commands, easily falls asleep  Psychiatric:        Mood and Affect: Mood normal.    ED Course/Procedures     .Critical Care  Date/Time: 10/30/2020 10:34 AM Performed by: Lorelle Gibbs, DO Authorized by: Lorelle Gibbs, DO   Critical care provider statement:    Critical care time (minutes):  45   Critical care was necessary to treat or prevent imminent or life-threatening deterioration of the following conditions:  Renal failure, trauma and CNS failure or compromise   Critical care was time spent personally by me on the following activities:  Discussions with consultants,  evaluation of patient's response to treatment, examination of patient, ordering and performing treatments and interventions, ordering and review of laboratory studies, ordering and review of radiographic studies, pulse oximetry, re-evaluation of patient's condition, obtaining history from patient or surrogate and review of old charts   I assumed direction of critical care for this patient from another provider in my specialty: no     Care discussed with: admitting provider    MDM    Patient has a left open ankle fracture that has been splinted, concern for DP pulses, vascular was able to Doppler a pulse at bedside, orthopedics is following for possible surgical intervention.  Tetanus and antibiotics given.  Blood work shows new renal failure and rhabdomyolysis, patient is currently being hydrated, instructed to place Foley.  Head CT shows chronic subdural hematoma without any midline shift/edema.  Neurosurgery has been made aware, nothing acute at this time.  There is also noted new hypodensities in the occipital and cerebellar region.  Patient does not have a clear last known normal.  He tells me that is around 830 last night that he felt well, this story changes many times, inconsistent, unclear what transpired.  It is possible that patient is presenting like this with his uremia.  Will consult neurology for guidance on next imaging, contrast/perfusion study contraindicated due to kidney dysfunction.  Patient medically admitted.  VSS at time of admission.  Neurology recommends MRI for further evaluation, of note patient did have another episode of hypoglycemia, treated with amp of D50. Patient not known to be on any oral hypoglycemics/insulin. Admitted inpatient team aware.       Lorelle Gibbs, DO 10/30/20 1033    Shamiah Kahler, Alvin Critchley, DO 10/30/20 1034    Kashira Behunin, Alvin Critchley, DO 10/30/20 1126    Shawni Volkov, Alvin Critchley, DO 10/30/20 1543

## 2020-10-30 NOTE — ED Notes (Signed)
Ortho tech at bedside. Xray at bedside.

## 2020-10-30 NOTE — Anesthesia Procedure Notes (Signed)
Procedure Name: Intubation Date/Time: 10/30/2020 6:09 PM Performed by: Reece Agar, CRNA Pre-anesthesia Checklist: Patient identified, Emergency Drugs available, Suction available and Patient being monitored Patient Re-evaluated:Patient Re-evaluated prior to induction Oxygen Delivery Method: Circle System Utilized Preoxygenation: Pre-oxygenation with 100% oxygen Induction Type: IV induction, Rapid sequence and Cricoid Pressure applied Ventilation: Unable to mask ventilate Laryngoscope Size: Mac and 3 Grade View: Grade I Tube type: Oral Tube size: 7.5 mm Number of attempts: 1 Airway Equipment and Method: Stylet Placement Confirmation: ETT inserted through vocal cords under direct vision, positive ETCO2 and breath sounds checked- equal and bilateral Secured at: 23 cm Tube secured with: Tape Dental Injury: Teeth and Oropharynx as per pre-operative assessment  Comments: Pt with poor dentition. Remains the same as pre-operatively

## 2020-10-30 NOTE — ED Triage Notes (Signed)
Per ems, pt from home. EMS called for pt "not acting like himself/confused/non-verbal." Upon ems arrival, CBG of 51, given 186mls D10 and pt started becoming more alert and answering questions. Pt also had Afib rvr on monitor rate at 145-160. EMS BP initially 90/50, after 163mls NS BP 117/70. Hx stroke with left sided deficits.   Pt reports he fell and was on the floor for 2 hours at his friend's house. Pt reports drinking alcohol last night. Upon arrival, pt noted to have obvious abnormal angulation to left ankle with bleeding controlled. Last tetanus unknown.  CBG 124 in triage.

## 2020-10-30 NOTE — ED Notes (Addendum)
Notified provider unable to bladder scan because it is not functioning properly. Verifying if  straight cath the next step. Awaiting response.

## 2020-10-30 NOTE — ED Notes (Signed)
Ultrasound at the bedside

## 2020-10-31 ENCOUNTER — Inpatient Hospital Stay (HOSPITAL_COMMUNITY): Payer: Medicare Other

## 2020-10-31 ENCOUNTER — Encounter (HOSPITAL_COMMUNITY): Payer: Self-pay | Admitting: Orthopedic Surgery

## 2020-10-31 DIAGNOSIS — R4182 Altered mental status, unspecified: Secondary | ICD-10-CM | POA: Diagnosis not present

## 2020-10-31 DIAGNOSIS — I639 Cerebral infarction, unspecified: Secondary | ICD-10-CM | POA: Diagnosis not present

## 2020-10-31 LAB — HEPATIC FUNCTION PANEL
ALT: 562 U/L — ABNORMAL HIGH (ref 0–44)
AST: 500 U/L — ABNORMAL HIGH (ref 15–41)
Albumin: 3 g/dL — ABNORMAL LOW (ref 3.5–5.0)
Alkaline Phosphatase: 79 U/L (ref 38–126)
Bilirubin, Direct: 1 mg/dL — ABNORMAL HIGH (ref 0.0–0.2)
Indirect Bilirubin: 1.3 mg/dL — ABNORMAL HIGH (ref 0.3–0.9)
Total Bilirubin: 2.3 mg/dL — ABNORMAL HIGH (ref 0.3–1.2)
Total Protein: 5.3 g/dL — ABNORMAL LOW (ref 6.5–8.1)

## 2020-10-31 LAB — BLOOD GAS, VENOUS
Acid-base deficit: 11.1 mmol/L — ABNORMAL HIGH (ref 0.0–2.0)
Bicarbonate: 16.1 mmol/L — ABNORMAL LOW (ref 20.0–28.0)
Drawn by: 5694
FIO2: 36
O2 Saturation: 75.7 %
Patient temperature: 37
pCO2, Ven: 47.9 mmHg (ref 44.0–60.0)
pH, Ven: 7.153 — CL (ref 7.250–7.430)
pO2, Ven: 48.8 mmHg — ABNORMAL HIGH (ref 32.0–45.0)

## 2020-10-31 LAB — GLUCOSE, CAPILLARY
Glucose-Capillary: 134 mg/dL — ABNORMAL HIGH (ref 70–99)
Glucose-Capillary: 181 mg/dL — ABNORMAL HIGH (ref 70–99)
Glucose-Capillary: 182 mg/dL — ABNORMAL HIGH (ref 70–99)
Glucose-Capillary: 194 mg/dL — ABNORMAL HIGH (ref 70–99)
Glucose-Capillary: 195 mg/dL — ABNORMAL HIGH (ref 70–99)
Glucose-Capillary: 177 mg/dL — ABNORMAL HIGH (ref 70–99)

## 2020-10-31 LAB — POCT I-STAT 7, (LYTES, BLD GAS, ICA,H+H)
Acid-base deficit: 10 mmol/L — ABNORMAL HIGH (ref 0.0–2.0)
Acid-base deficit: 9 mmol/L — ABNORMAL HIGH (ref 0.0–2.0)
Bicarbonate: 17.4 mmol/L — ABNORMAL LOW (ref 20.0–28.0)
Bicarbonate: 17.8 mmol/L — ABNORMAL LOW (ref 20.0–28.0)
Calcium, Ion: 0.98 mmol/L — ABNORMAL LOW (ref 1.15–1.40)
Calcium, Ion: 0.98 mmol/L — ABNORMAL LOW (ref 1.15–1.40)
HCT: 29 % — ABNORMAL LOW (ref 39.0–52.0)
HCT: 28 % — ABNORMAL LOW (ref 39.0–52.0)
Hemoglobin: 9.9 g/dL — ABNORMAL LOW (ref 13.0–17.0)
Hemoglobin: 9.5 g/dL — ABNORMAL LOW (ref 13.0–17.0)
O2 Saturation: 86 %
O2 Saturation: 87 %
Patient temperature: 96.7
Patient temperature: 96.6
Potassium: 5 mmol/L (ref 3.5–5.1)
Potassium: 4.8 mmol/L (ref 3.5–5.1)
Sodium: 133 mmol/L — ABNORMAL LOW (ref 135–145)
Sodium: 134 mmol/L — ABNORMAL LOW (ref 135–145)
TCO2: 19 mmol/L — ABNORMAL LOW (ref 22–32)
TCO2: 19 mmol/L — ABNORMAL LOW (ref 22–32)
pCO2 arterial: 41 mmHg (ref 32.0–48.0)
pCO2 arterial: 40.8 mmHg (ref 32.0–48.0)
pH, Arterial: 7.229 — ABNORMAL LOW (ref 7.350–7.450)
pH, Arterial: 7.241 — ABNORMAL LOW (ref 7.350–7.450)
pO2, Arterial: 58 mmHg — ABNORMAL LOW (ref 83.0–108.0)
pO2, Arterial: 59 mmHg — ABNORMAL LOW (ref 83.0–108.0)

## 2020-10-31 LAB — CBC WITH DIFFERENTIAL/PLATELET
Abs Immature Granulocytes: 0.05 10*3/uL (ref 0.00–0.07)
Basophils Absolute: 0 10*3/uL (ref 0.0–0.1)
Basophils Relative: 0 %
Eosinophils Absolute: 0 10*3/uL (ref 0.0–0.5)
Eosinophils Relative: 0 %
HCT: 29.7 % — ABNORMAL LOW (ref 39.0–52.0)
Hemoglobin: 10.2 g/dL — ABNORMAL LOW (ref 13.0–17.0)
Immature Granulocytes: 1 %
Lymphocytes Relative: 1 %
Lymphs Abs: 0.1 10*3/uL — ABNORMAL LOW (ref 0.7–4.0)
MCH: 35.7 pg — ABNORMAL HIGH (ref 26.0–34.0)
MCHC: 34.3 g/dL (ref 30.0–36.0)
MCV: 103.8 fL — ABNORMAL HIGH (ref 80.0–100.0)
Monocytes Absolute: 0.3 10*3/uL (ref 0.1–1.0)
Monocytes Relative: 3 %
Neutro Abs: 9.8 10*3/uL — ABNORMAL HIGH (ref 1.7–7.7)
Neutrophils Relative %: 95 %
Platelets: 93 10*3/uL — ABNORMAL LOW (ref 150–400)
RBC: 2.86 MIL/uL — ABNORMAL LOW (ref 4.22–5.81)
RDW: 14.5 % (ref 11.5–15.5)
WBC: 10.2 10*3/uL (ref 4.0–10.5)
nRBC: 0.4 % — ABNORMAL HIGH (ref 0.0–0.2)

## 2020-10-31 LAB — BASIC METABOLIC PANEL
Anion gap: 17 — ABNORMAL HIGH (ref 5–15)
BUN: 76 mg/dL — ABNORMAL HIGH (ref 8–23)
CO2: 15 mmol/L — ABNORMAL LOW (ref 22–32)
Calcium: 6.9 mg/dL — ABNORMAL LOW (ref 8.9–10.3)
Chloride: 100 mmol/L (ref 98–111)
Creatinine, Ser: 5.22 mg/dL — ABNORMAL HIGH (ref 0.61–1.24)
GFR, Estimated: 11 mL/min — ABNORMAL LOW (ref >60)
Glucose, Bld: 133 mg/dL — ABNORMAL HIGH (ref 70–99)
Potassium: 5 mmol/L (ref 3.5–5.1)
Sodium: 132 mmol/L — ABNORMAL LOW (ref 135–145)

## 2020-10-31 LAB — CK: Total CK: 2340 U/L — ABNORMAL HIGH (ref 49–397)

## 2020-10-31 LAB — HEPATITIS PANEL, ACUTE
HCV Ab: NONREACTIVE
Hep A IgM: NONREACTIVE
Hep B C IgM: NONREACTIVE
Hepatitis B Surface Ag: NONREACTIVE

## 2020-10-31 LAB — MRSA NEXT GEN BY PCR, NASAL: MRSA by PCR Next Gen: NOT DETECTED

## 2020-10-31 LAB — LACTIC ACID, PLASMA: Lactic Acid, Venous: 1 mmol/L (ref 0.5–1.9)

## 2020-10-31 LAB — PROTIME-INR
INR: 1.5 — ABNORMAL HIGH (ref 0.8–1.2)
Prothrombin Time: 17.9 seconds — ABNORMAL HIGH (ref 11.4–15.2)

## 2020-10-31 LAB — AMMONIA: Ammonia: 29 umol/L (ref 9–35)

## 2020-10-31 MED ORDER — MUPIROCIN 2 % EX OINT: 1.0000 "application " | TOPICAL_OINTMENT | Freq: Two times a day (BID) | CUTANEOUS | Status: DC

## 2020-10-31 MED ORDER — AMIODARONE IV BOLUS ONLY 150 MG/100ML
150.0000 mg | Freq: Once | INTRAVENOUS | Status: AC
  Administered 2020-10-31: 150 mg via INTRAVENOUS
  Filled 2020-10-31: qty 100

## 2020-10-31 MED ORDER — CALCIUM GLUCONATE-NACL 2-0.675 GM/100ML-% IV SOLN
2.0000 g | Freq: Once | INTRAVENOUS | Status: AC
  Administered 2020-10-31: 2000 mg via INTRAVENOUS
  Filled 2020-10-31: qty 100

## 2020-10-31 MED ORDER — INSULIN ASPART 100 UNIT/ML IJ SOLN
1.0000 [IU] | INTRAMUSCULAR | Status: DC
  Administered 2020-10-31 – 2020-11-01 (×3): 2 [IU] via SUBCUTANEOUS
  Administered 2020-11-01: 3 [IU] via SUBCUTANEOUS
  Administered 2020-11-02: 2 [IU] via SUBCUTANEOUS
  Administered 2020-11-02: 1 [IU] via SUBCUTANEOUS

## 2020-10-31 MED ORDER — CHLORHEXIDINE GLUCONATE CLOTH 2 % EX PADS
6.0000 | MEDICATED_PAD | Freq: Every day | CUTANEOUS | Status: AC
  Administered 2020-10-31 – 2020-11-04 (×5): 6 via TOPICAL

## 2020-10-31 MED ORDER — SODIUM BICARBONATE 8.4 % IV SOLN
INTRAVENOUS | Status: DC
  Filled 2020-10-31: qty 1000

## 2020-10-31 MED ORDER — SODIUM CHLORIDE 0.9 % IV SOLN
3.0000 g | INTRAVENOUS | Status: DC
  Administered 2020-11-01 – 2020-11-03 (×3): 3 g via INTRAVENOUS
  Filled 2020-10-31 (×4): qty 8

## 2020-10-31 MED ORDER — TRAMADOL HCL 50 MG PO TABS
50.0000 mg | ORAL_TABLET | Freq: Two times a day (BID) | ORAL | Status: DC | PRN
  Administered 2020-11-04: 50 mg via ORAL
  Filled 2020-10-31: qty 1

## 2020-10-31 MED ORDER — MUPIROCIN 2 % EX OINT
1.0000 "application " | TOPICAL_OINTMENT | Freq: Two times a day (BID) | CUTANEOUS | Status: AC
  Administered 2020-10-31 – 2020-11-04 (×9): 1 via NASAL
  Filled 2020-10-31 (×5): qty 22
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
## 2020-10-31 NOTE — Consult Note (Signed)
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 Value Ref Range   Opiates POSITIVE (A) NONE DETECTED   Cocaine NONE DETECTED NONE DETECTED   Benzodiazepines NONE DETECTED NONE DETECTED   Amphetamines NONE DETECTED NONE DETECTED   Tetrahydrocannabinol POSITIVE (A) NONE DETECTED   Barbiturates NONE DETECTED NONE DETECTED    Comment: (NOTE) DRUG SCREEN FOR MEDICAL PURPOSES ONLY.  IF CONFIRMATION IS NEEDED FOR ANY PURPOSE, NOTIFY LAB WITHIN 5 DAYS.  LOWEST DETECTABLE LIMITS FOR URINE DRUG SCREEN Drug Class                      Cutoff (ng/mL) Amphetamine and metabolites    1000 Barbiturate and metabolites    200 Benzodiazepine                 259 Tricyclics and metabolites     300 Opiates and metabolites        300 Cocaine and metabolites        300 THC                            50 Performed at [facility], [place] [address] [place], [place] [address]   Sodium, urine, random     Status: None   Collection Time: 10/30/20 11:06 PM  Result Value Ref Range   Sodium, Ur 67 mmol/L    Comment: Performed at [facility] [address], [place], [place] [address]  Creatinine, urine, random     Status: None   Collection Time: 10/30/20 11:06 PM  Result Value Ref Range   Creatinine, Urine 34.43 mg/dL    Comment: Performed at [facility] [address] [place], [place] [address]  Glucose, capillary     Status: Abnormal   Collection Time: 10/30/20 11:56 PM  Result Value Ref Range   Glucose-Capillary 108 (H) 70 - 99 mg/dL    Comment: Glucose reference range applies only to samples taken after fasting for at least 8 hours.   Comment 1 Notify RN    Comment 2 Document in Chart   Glucose, capillary     Status: Abnormal   Collection  Time: 10/31/20  2:17 AM  Result Value Ref Range   Glucose-Capillary 134 (H) 70 - 99 mg/dL    Comment: Glucose reference range applies only to samples taken after fasting for at least 8 hours.  Lactic acid, plasma     Status: None   Collection Time: 10/31/20  2:38 AM  Result Value Ref Range   Lactic Acid, Venous 1.0 0.5 - 1.9 mmol/L    Comment: Performed at [facility] [address], [place], [place] [address]  Blood gas, venous     Status: Abnormal   Collection Time: 10/31/20  2:38 AM  Result Value Ref Range   FIO2 36.00    pH, Ven 7.153 (LL) 7.250 - 7.430    Comment: CRITICAL RESULT CALLED TO, READ BACK BY AND VERIFIED WITH: TIM IRBY, RN 10/31/2020 0253 [address]    pCO2, Ven 47.9 44.0 - 60.0 mmHg   pO2, Ven 48.8 (H) 32.0 - 45.0 mmHg    Bicarbonate 16.1 (L) 20.0 - 28.0 mmol/L   Acid-base deficit 11.1 (H) 0.0 - 2.0 mmol/L   O2 Saturation 75.7 %   Patient temperature 37.0    Collection site VENOUS    Drawn by 0160    Sample type VENOUS     Comment: Performed at [facility], [place] [address], [place], [place] [address]  CK     Status: Abnormal   Collection Time: 10/31/20  2:38 AM  Result Value Ref Range   Total CK 2,340 (H) 49 - 397 U/L    Comment: Performed at [facility], [place] [address] [place], [place] [address]  Basic metabolic panel     Status: Abnormal   Collection Time: 10/31/20  2:38 AM  Result Value Ref Range   Sodium 132 (L) 135 - 145 mmol/L   Potassium 5.0 3.5 - 5.1 mmol/L   Chloride 100 98 - 111 mmol/L   CO2 15 (L) 22 - 32 mmol/L   Glucose, Bld 133 (H) 70 - 99 mg/dL    Comment: Glucose reference range applies only to samples taken after fasting for at least 8 hours.   BUN 76 (H) 8 - 23 mg/dL   Creatinine, Ser 5.22 (H) 0.61 - 1.24 mg/dL   Calcium 6.9 (L) 8.9 - 10.3 mg/dL   GFR, Estimated 11 (L) >60 mL/min    Comment: (NOTE) Calculated using the CKD-EPI Creatinine Equation (2021)    Anion gap 17 (H) 5 - 15    Comment: Performed at [facility] [address], [place], [place] [address]  CBC with Differential/Platelet     Status: Abnormal   Collection Time: 10/31/20  2:38 AM  Result Value Ref Range   WBC 10.2 4.0 - 10.5 K/uL   RBC 2.86 (L) 4.22 - 5.81 MIL/uL   Hemoglobin 10.2 (L) 13.0 - 17.0 g/dL   HCT 29.7 (L) 39.0 - 52.0 %   MCV 103.8 (H) 80.0 - 100.0 fL   MCH 35.7 (H) 26.0 - 34.0 pg   MCHC 34.3 30.0 - 36.0 g/dL   RDW 14.5 11.5 - 15.5 %   Platelets 93 (L) 150 - 400 K/uL    Comment: Immature Platelet Fraction may be clinically indicated, consider ordering this additional test [id number] REPEATED TO VERIFY PLATELET COUNT CONFIRMED BY SMEAR    nRBC 0.4 (H) 0.0 - 0.2 %   Neutrophils Relative % 95 %   Neutro Abs 9.8 (H) 1.7 - 7.7 K/uL   Lymphocytes Relative 1 %  Lymphs Abs  0.1 (L) 0.7 - 4.0 K/uL   Monocytes Relative 3 %   Monocytes Absolute 0.3 0.1 - 1.0 K/uL   Eosinophils Relative 0 %   Eosinophils Absolute 0.0 0.0 - 0.5 K/uL   Basophils Relative 0 %   Basophils Absolute 0.0 0.0 - 0.1 K/uL   Immature Granulocytes 1 %   Abs Immature Granulocytes 0.05 0.00 - 0.07 K/uL    Comment: Performed at [facility] [address] [place], [place] [address]  Hepatic function panel     Status: Abnormal   Collection Time: 10/31/20  2:38 AM  Result Value Ref Range   Total Protein 5.3 (L) 6.5 - 8.1 g/dL   Albumin 3.0 (L) 3.5 - 5.0 g/dL   AST 500 (H) 15 - 41 U/L   ALT 562 (H) 0 - 44 U/L   Alkaline Phosphatase 79 38 - 126 U/L   Total Bilirubin 2.3 (H) 0.3 - 1.2 mg/dL   Bilirubin, Direct 1.0 (H) 0.0 - 0.2 mg/dL   Indirect Bilirubin 1.3 (H) 0.3 - 0.9 mg/dL    Comment: Performed at [facility] [address] [place], [place] [address]  Glucose, capillary     Status: Abnormal   Collection Time: 10/31/20  4:05 AM  Result Value Ref Range   Glucose-Capillary 181 (H) 70 - 99 mg/dL    Comment: Glucose reference range applies only to samples taken after fasting for at least 8 hours.   Comment 1 Notify RN    Comment 2 Document in Chart   I-STAT 7, (LYTES, BLD GAS, ICA, H+H)     Status: Abnormal   Collection Time: 10/31/20  4:28 AM  Result Value Ref Range   pH, Arterial 7.229 (L) 7.350 - 7.450   pCO2 arterial 41.0 32.0 - 48.0 mmHg   pO2, Arterial 58 (L) 83.0 - 108.0 mmHg   Bicarbonate 17.4 (L) 20.0 - 28.0 mmol/L   TCO2 19 (L) 22 - 32 mmol/L   O2 Saturation 86.0 %   Acid-base deficit 10.0 (H) 0.0 - 2.0 mmol/L   Sodium 133 (L) 135 - 145 mmol/L   Potassium 5.0 3.5 - 5.1 mmol/L   Calcium, Ion 0.98 (L) 1.15 - 1.40 mmol/L   HCT 29.0 (L) 39.0 - 52.0 %   Hemoglobin 9.9 (L) 13.0 - 17.0 g/dL   Patient temperature 96.7 F    Collection site Radial    Drawn by RT    Sample type ARTERIAL   Ammonia     Status: None   Collection Time: 10/31/20  5:02 AM  Result  Value Ref Range   Ammonia 29 9 - 35 umol/L    Comment: Performed at [facility], [place] [address] [place], [place] [address]  Protime-INR     Status: Abnormal   Collection Time: 10/31/20  5:02 AM  Result Value Ref Range   Prothrombin Time 17.9 (H) 11.4 - 15.2 seconds   INR 1.5 (H) 0.8 - 1.2    Comment: (NOTE) INR goal varies based on device and disease states. Performed at [facility], [place] [address] [place], [place] [address]   MRSA Next Gen by PCR, Nasal     Status: None   Collection Time: 10/31/20  5:57 AM   Specimen: Nasal Mucosa; Nasal Swab  Result Value Ref Range   MRSA by PCR Next Gen NOT DETECTED NOT DETECTED    Comment: (NOTE) The GeneXpert MRSA Assay (FDA approved for NASAL specimens only),  is one component of a comprehensive MRSA colonization surveillance program. It is not intended to diagnose MRSA infection nor to guide or monitor treatment for MRSA infections. Test performance is not FDA approved in patients less than 10 years old. Performed at [facility], [place] [address] [place], [place] [address]   I-STAT 7, (LYTES, BLD GAS, ICA, H+H)     Status: Abnormal   Collection Time: 10/31/20  6:18 AM  Result Value Ref Range   pH, Arterial 7.241 (L) 7.350 - 7.450   pCO2 arterial 40.8 32.0 - 48.0 mmHg   pO2, Arterial 59 (L) 83.0 - 108.0 mmHg   Bicarbonate 17.8 (L) 20.0 - 28.0 mmol/L   TCO2 19 (L) 22 - 32 mmol/L   O2 Saturation 87.0 %   Acid-base deficit 9.0 (H) 0.0 - 2.0 mmol/L   Sodium 134 (L) 135 - 145 mmol/L   Potassium 4.8 3.5 - 5.1 mmol/L   Calcium, Ion 0.98 (L) 1.15 - 1.40 mmol/L   HCT 28.0 (L) 39.0 - 52.0 %   Hemoglobin 9.5 (L) 13.0 - 17.0 g/dL   Patient temperature 96.6 F    Collection site Radial    Drawn by RT    Sample type ARTERIAL   Glucose, capillary     Status: Abnormal   Collection Time: 10/31/20  7:46 AM  Result Value Ref Range   Glucose-Capillary 182 (H) 70 - 99 mg/dL    Comment: Glucose reference range applies only to  samples taken after fasting for at least 8 hours.  Glucose, capillary     Status: Abnormal   Collection Time: 10/31/20 11:45 AM  Result Value Ref Range   Glucose-Capillary 194 (H) 70 - 99 mg/dL    Comment: Glucose reference range applies only to samples taken after fasting for at least 8 hours.  Glucose, capillary     Status: Abnormal   Collection Time: 10/31/20  4:55 PM  Result Value Ref Range   Glucose-Capillary 195 (H) 70 - 99 mg/dL    Comment: Glucose reference range applies only to samples taken after fasting for at least 8 hours.   DG Ankle 2 Views Right  Result Date: 10/30/2020 CLINICAL DATA:  Right ankle ex fix. EXAM: DG C-ARM 1-60 MIN; RIGHT ANKLE - 2 VIEW FLUOROSCOPY TIME:  Fluoroscopy Time:  20 seconds. Number of Acquired Spot Images: 2 COMPARISON:  10/30/2020. FINDINGS: Two C-arm fluoroscopic images were obtained intraoperatively and submitted for post operative interpretation. These images demonstrate external fixation of highly comminuted distal tibial and fibular fractures with fixation hardware only partially imaged. The alignment of the fractures appears improved with improved angulation. There is approximately half shaft width lateral displacement of the fibular fracture and slight (approximately 15%) medial displacement of the tibial fracture. These fractures were better characterized on prior radiographs. Please see the performing provider's procedural report for further detail. IMPRESSION: Intraoperative fluoroscopy, as detailed above. Electronically Signed   By: Margaretha Sheffield MD   On: 10/30/2020 19:20   CT HEAD WO CONTRAST  Addendum Date: 10/30/2020   ADDENDUM REPORT: 10/30/2020 09:12 ADDENDUM: Study discussed by telephone with Dr. Dina Rich in the ED on 10/30/2020 at 0900 hours. Electronically Signed   By: Genevie Ann M.D.   On: 10/30/2020 09:12   Result Date: 10/30/2020 CLINICAL DATA:  70 year old male with mental status change status post fall, found down. EXAM: CT HEAD  WITHOUT CONTRAST TECHNIQUE: Contiguous axial images were obtained from the base of the skull through the vertex without intravenous contrast.  COMPARISON:  Brain MRI 01/18/2012.  Head CT 01/17/2012. FINDINGS: Brain: Chronic right PCA infarct which occurred in 2013. Small volume right side subdural collection is CSF density, and generally 3 mm in thickness. There is perhaps subtle mass effect on the right lateral ventricle but no midline shift. No hyperdense intracranial hemorrhage identified. Age indeterminate small but confluent hypodensity in the left occipital lobe white matter on series 3, image 14, and right superior cerebellum on image 12. Small chronic appearing left inferior cerebellar infarcts probably correspond to those on the 2013 MRI. Deep gray nuclei and brainstem appear to remain within normal limits. Basilar cisterns remain normal. Vascular: Calcified atherosclerosis at the skull base. Skull: Stable and intact.  No acute osseous abnormality identified. Sinuses/Orbits: Visualized paranasal sinuses and mastoids are stable and well aerated. Other: Left posterior convexity scalp hematoma measuring up to 6 mm on series 4, image 46. Underlying calvarium appears intact. More broad-based and subtle left anterior convexity scalp hematoma or contusion on image 48. Visualized orbit soft tissues are within normal limits. IMPRESSION: 1. Age indeterminate small infarcts in the left occipital , right superior cerebellum. No associated hemorrhage or mass effect. 2. Unrelated appearing small 2-3 mm low-density right side subdural collection. This is compatible with age indeterminate subdural hematoma or hygroma (favor the latter). No associated midline shift. 3. Left inferior cerebellar artery territory. 4. Superimposed chronic right PCA and small left PICA territory infarcts. 5. Left scalp soft tissue injuries without underlying skull fracture. Electronically Signed: By: Genevie Ann M.D. On: 10/30/2020 08:58   CT  CERVICAL SPINE WO CONTRAST  Result Date: 10/30/2020 CLINICAL DATA:  70 year old male with mental status change status post fall, found down. Head CT EXAM: CT CERVICAL SPINE WITHOUT CONTRAST TECHNIQUE: Multidetector CT imaging of the cervical spine was performed without intravenous contrast. Multiplanar CT image reconstructions were also generated. COMPARISON:  Today reported separately. MRI cervical spine 10/06/2013. portable chest x-ray today. FINDINGS: Alignment: Reversal of cervical lordosis is new compared to 2015 multilevel degenerative appearing spondylolisthesis detailed below. Cervicothoracic junction alignment and posterior element alignment appears maintained. Skull base and vertebrae: Visualized skull base is intact. No atlanto-occipital dissociation. C1 and C2 appear intact and aligned. Chronic severe cervical spine degeneration, further detailed below. No convincing acute osseous abnormality identified. Soft tissues and spinal canal: No prevertebral fluid or swelling. No visible canal hematoma. Disc levels: Chronic cervical ACDF C5-C6 and C6-C7 is new since 2015 although with superimposed bulky chronic anterior endplate osteophytosis at those levels. Solid arthrodesis at C5-C6. Scant if any arthrodesis at C6-C7. 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 Mild and possibly moderate spinal stenosis at the former. Superimposed mild anterolisthesis at both C2-C3 and C3-C4. Upper chest: Visible upper thoracic levels appear intact. Motion artifact but abnormal Patchy and confluent mostly ground-glass opacity in both upper lobes on series 5, image 94. IMPRESSION: 1. No definite acute traumatic injury identified in the cervical spine. 2. Chronic very severe cervical spine degeneration, with prior ACDF C5-C6 and C6-C7. Probable pseudoarthrosis at the latter. 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 3. Patchy bilateral ground-glass opacity in the lung apices, remaining suspicious for acute viral/atypical respiratory infection in conjunction with the portable chest today. Electronically Signed   By: Genevie Ann M.D.   On: 10/30/2020 09:11   MR Brain Wo Contrast (neuro protocol)  Result Date: 10/31/2020 CLINICAL DATA:  Neuro deficit,  acute, stroke suspected. Encephalopathy. Multifactorial. Suspect alcohol withdrawal. EXAM: MRI HEAD WITHOUT CONTRAST TECHNIQUE: Multiplanar, multiecho pulse sequences of the brain and surrounding structures were obtained without intravenous contrast. COMPARISON:  CT head without contrast 09/30/2020. MR head without contrast 01/18/2012 FINDINGS: Brain: Multifocal areas are present. Of acute nonhemorrhagic infarction mm focus present in the right superior cerebellum. Remote medial right occipital lobe infarcts encephalomalacia again noted. Posterior and lateral to this is a focal area of restricted diffusion. White matter infarct in the posterior left temporal and parietal lobe measures up to 18 mm. Focal cortical infarct is noted in the anterior right frontal lobe. Right parietal subcortical white matter infarct present. T2 and FLAIR signal changes are associated with each of these lesions. No acute hemorrhage mass lesion present. No other significant white matter changes are present. The ventricles are of normal size. Thin extra-axial collection is again noted on the right with near CSF intensity. No fluid levels are present. The internal auditory canals are within normal limits. Remote lacunar infarcts are present in the inferior cerebellum bilaterally, left greater than right. Vascular: Flow is present in the major intracranial arteries. Skull and upper cervical spine: The craniocervical junction is normal. Upper cervical spine is within normal limits. Marrow signal is unremarkable. Sinuses/Orbits: Mild mucosal thickening is present in the left  maxillary sinus. There is some fluid in the mastoid air cells bilaterally. The paranasal sinuses and mastoid air cells are otherwise clear. The globes and orbits are within normal limits. IMPRESSION: 1. Multifocal acute nonhemorrhagic infarcts. Probable central source. 2. 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 3. Acute nonhemorrhagic infarction involving the right superior cerebellum. 4. Acute/subacute nonhemorrhagic infarct involving the posterior left temporal and parietal lobe measuring up to 18 mm. 5. Additional acute nonhemorrhagic infarcts in the anterior right frontal cortex and high right parietal white matter small focus of acute nonhemorrhagic infarct in the posterior right occipital lobe, adjacent to the area chronic encephalomalacia. 6. Remote medial right occipital lobe infarct. 7. Remote lacunar infarcts of the inferior cerebellum bilaterally, left greater than right. Electronically Signed   By: San Morelle M.D.   On: 10/31/2020 16:15   US RENAL  Result Date: 10/30/2020 CLINICAL DATA:  Acute kidney injury. EXAM: RENAL / URINARY TRACT ULTRASOUND COMPLETE COMPARISON:  None. FINDINGS: Right Kidney: Renal measurements: 11.8 x 5.3 x 5.0 cm = volume: 160.5 mL. Echogenicity within normal limits. No mass or hydronephrosis visualized. Left Kidney: Renal measurements: 11.4 x 6.8 x 4.9 cm = volume: 198.0 mL. Echogenicity within normal limits. No mass or hydronephrosis visualized. Bladder: Diffuse bladder distension measuring 15.2 x 8.8 by 12.0 cm (volume = 840 cm^3). Other: None. IMPRESSION: 1. Echogenicity within normal limits. No mass or hydronephrosis visualized. 2. Diffuse urinary bladder distension with a volume of approximately 140 cc. Electronically Signed   By: Kerby Moors M.D.   On: 10/30/2020 16:53   CT ANKLE LEFT WO CONTRAST  Result Date: 10/31/2020 CLINICAL DATA:  Open ankle fracture. EXAM: CT OF THE LEFT ANKLE WITHOUT CONTRAST  TECHNIQUE: Multidetector CT imaging of the left ankle was performed according to the standard protocol. Multiplanar CT image reconstructions were also generated. COMPARISON:  Left ankle x-rays from yesterday. FINDINGS: Bones/Joint/Cartilage Acute highly comminuted fracture of the distal tibial metadiaphysis without intra-articular extension. The dominant fracture fragments are medially displaced 9 mm. There are multiple impacted small bone fragments. Acute highly comminuted fracture of the distal fibular diaphysis. The dominant fracture fragments are medially displaced  up to 5 mm there is oblique longitudinal extension of the fracture line through the anterior tip of the lateral malleolus (series 3, image 119). The ankle mortise is symmetric.  The talar dome is intact. Tiny avulsion fracture of the lateral talar process (series 3, image 131). Acute nondisplaced fractures through the second and third metatarsal bases (series 3, image 166). Tiny avulsion fractures of the dorsal medial first metatarsal base and distal medial plantar aspect of the medial cuneiform (series 9, image 64). Lisfranc alignment is grossly maintained. External fixation screw through the calcaneus. Joint spaces are preserved. No joint effusion. Osteopenia. Ligaments Ligaments are suboptimally evaluated by CT. Muscles and Tendons Grossly intact. Multiple small bone fragments surround the extensor hallucis longus and extensor digitorum longus tendons. Soft tissue Diffuse soft tissue swelling. Small amount of subcutaneous emphysema in the anterior distal lower leg. No fluid collection or hematoma. No soft tissue mass. IMPRESSION: 1. Acute highly comminuted fractures of the distal tibia and fibula as described above. No involvement of the tibial plafond and. 2. Tiny avulsion fractures of the lateral talar process, first metatarsal base, and medial cuneiform. Acute nondisplaced fractures through the second and third metatarsal bases. 3. Lisfranc  alignment is grossly maintained. Electronically Signed   By: Titus Dubin M.D.   On: 10/31/2020 09:50   DG Chest Port 1 View  Result Date: 10/30/2020 CLINICAL DATA:  70 year old male status post fall, found down. Confusion. Renal failure. EXAM: PORTABLE CHEST 1 VIEW COMPARISON:  Portable chest 05/23/2019 and earlier. FINDINGS: Portable AP semi upright view at 0701 hours. Lower lung volumes. Mediastinal contours remain normal. Chronic cervical ACDF. Visualized tracheal air column is within normal limits. New indistinct patchy and interstitial opacity scattered in both lungs, bilateral upper lobes and left lower lung. Right lung base less affected. No superimposed pneumothorax. No pulmonary edema suspected. No pleural effusion or consolidation. Chronic left clavicle deformity. No acute osseous abnormality identified. Negative visible bowel gas pattern. IMPRESSION: Patchy and confluent new bilateral pulmonary opacity since last year most suggestive of bilateral pneumonia. Consider viral/atypical etiology. Electronically Signed   By: Genevie Ann M.D.   On: 10/30/2020 07:25   DG Ankle Left Port  Result Date: 10/30/2020 CLINICAL DATA:  Postop EXAM: PORTABLE LEFT ANKLE - 2 VIEW COMPARISON:  10/30/2020 FINDINGS: Interval placement of external fixation device. Highly comminuted fractures involving the distal tibia and fibula with multiple displaced bone fragments. Gas in the soft tissues redemonstrated. IMPRESSION: Interval placement of external fixation device across highly comminuted distal tibial and fibular fractures Electronically Signed   By: Donavan Foil M.D.   On: 10/30/2020 21:34   DG Ankle Left Port  Result Date: 10/30/2020 CLINICAL DATA:  70 year old male status post fall.  Found down. EXAM: PORTABLE LEFT ANKLE - 2 VIEW COMPARISON:  None. FINDINGS: Splint or cast material about the left ankle. Severely comminuted fracture of the distal left tibia metadiaphysis. This seems to spare the articular  surfaces. Similar highly comminuted fracture of the distal left fibula shaft near the metadiaphysis. Both fractures demonstrate medial and mild anterior angulation. Talar dome and calcaneus appear intact. There is associated abnormal soft tissue gas anteriorly and medial to the tibia. IMPRESSION: 1. Splint or cast material in place. 2. Severely comminuted distal left tibia metadiaphysis fracture, seems to spare the plafond, but overlying soft tissue gas suspicious for OPEN fracture. 3. Similar highly comminuted fracture distal left fibula just proximal to the metadiaphysis. 4. Preserved mortise joint alignment. Electronically Signed   By:  Genevie Ann M.D.   On: 10/30/2020 07:28   DG C-Arm 1-60 Min  Result Date: 10/30/2020 CLINICAL DATA:  Right ankle ex fix. EXAM: DG C-ARM 1-60 MIN; RIGHT ANKLE - 2 VIEW FLUOROSCOPY TIME:  Fluoroscopy Time:  20 seconds. Number of Acquired Spot Images: 2 COMPARISON:  10/30/2020. FINDINGS: Two C-arm fluoroscopic images were obtained intraoperatively and submitted for post operative interpretation. These images demonstrate external fixation of highly comminuted distal tibial and fibular fractures with fixation hardware only partially imaged. The alignment of the fractures appears improved with improved angulation. There is approximately half shaft width lateral displacement of the fibular fracture and slight (approximately 15%) medial displacement of the tibial fracture. These fractures were better characterized on prior radiographs. Please see the performing provider's procedural report for further detail. IMPRESSION: Intraoperative fluoroscopy, as detailed above. Electronically Signed   By: Margaretha Sheffield MD   On: 10/30/2020 19:20   VAS [place] LOWER EXTREMITY ARTERIAL DUPLEX  Result Date: 10/30/2020 LOWER EXTREMITY ARTERIAL DUPLEX STUDY Patient Name:  Austin Andrade  Date of Exam:   10/30/2020 Medical Rec #: [id number]     Accession #:    [id number] Date of Birth: [birth year]     Patient  Gender: M Patient Age:   69Y Exam Location:  [facility] Procedure:      VAS [place] LOWER EXTREMITY ARTERIAL DUPLEX Referring Phys: [id number] [place] --------------------------------------------------------------------------------  Indications: Left ankle fracture, difficulty palpating pulses. High Risk Factors: Hypertension, hyperlipidemia, current smoker. Other Factors: Chronic Kidney Disease.  Current ABI: N/A Limitations: Bandaging and hard cast at ankle, recent fracture. Comparison Study: No prior studes. Performing Technologist: Darlin Coco RDMS,RVT  Examination Guidelines: A complete evaluation includes B-mode imaging, spectral Doppler, color Doppler, and power Doppler as needed of all accessible portions of each vessel. Bilateral testing is considered an integral part of a complete examination. Limited examinations for reoccurring indications may be performed as noted.   +-----------+--------+-----+--------+---------+--------------------------------+ LEFT       PSV cm/sRatioStenosisWaveform Comments                         +-----------+--------+-----+--------+---------+--------------------------------+ CFA Mid    103                  triphasic                                 +-----------+--------+-----+--------+---------+--------------------------------+ DFA        64                   biphasic                                  +-----------+--------+-----+--------+---------+--------------------------------+ SFA Prox   78                   triphasic                                 +-----------+--------+-----+--------+---------+--------------------------------+ SFA Mid    74                   triphasic                                 +-----------+--------+-----+--------+---------+--------------------------------+  SFA Distal 65                   triphasic                                  +-----------+--------+-----+--------+---------+--------------------------------+ POP Prox   53                   biphasic                                  +-----------+--------+-----+--------+---------+--------------------------------+ POP Distal 93                   triphasic                                 +-----------+--------+-----+--------+---------+--------------------------------+ TP Trunk   90                   triphasic                                 +-----------+--------+-----+--------+---------+--------------------------------+ ATA Distal                               Bandaging- unable to visualize   +-----------+--------+-----+--------+---------+--------------------------------+ PTA Distal 28                   biphasic Bandaging- limited visualization +-----------+--------+-----+--------+---------+--------------------------------+ PERO Distal                              Bandaging- unable to visualize   +-----------+--------+-----+--------+---------+--------------------------------+ DP                                       Bandaging- unable to visualize   +-----------+--------+-----+--------+---------+--------------------------------+  Summary: Left: All visualized arteries are patent without evidence of stenosis.  See table(s) above for measurements and observations. Electronically signed by Monica Martinez MD on 10/30/2020 at 2:44:42 PM.    Final     Assessment: 70 y.o. male trauma patient with multiple medical comorbidities and multifocal acute strokes on MRI 1. Exam reveals left homonymous hemianopsia, which corresponds to his old right occipital lobe stroke, as seen on MRI. In the context of poor cooperation, no focal motor or sensory deficit is noted.  2. MRI brain: Multifocal acute nonhemorrhagic infarcts in separate vascular territories; probable central source. Thin extra-axial collection on the right is most consistent with a subdural  hygroma. Acute nonhemorrhagic infarction involving the right superior cerebellum. Acute/subacute nonhemorrhagic infarct involving the posterior left temporal and parietal lobe measuring up to 18 mm. Additional acute nonhemorrhagic infarcts in the anterior right frontal cortex and high right parietal white matter. Small focus of acute nonhemorrhagic infarct in the posterior right occipital lobe, adjacent to the area of chronic encephalomalacia. Remote medial right occipital lobe infarct. Remote lacunar infarcts of the inferior cerebellum bilaterally, left greater than right. 3. Stroke Risk Factors - New onset atrial fibrillation, prior strokes, CHF, HLD and HTN  4. Etiology for his multifocal strokes is most likely cardioembolic, although fat emboli also on  the DDx given recent fracture.     Recommendations: 1. HgbA1c, fasting lipid panel 2. MRA of the brain without contrast 3. PT consult, OT consult, Speech consult 4. Echocardiogram 5. Carotid dopplers 6. Prophylactic therapy- Given recent surgery, recommending ASA for now instead of Plavix, if not contraindicated from a surgical standpoint. If his atrial fibrillation is not due to an intercurrent medical condition and is expected to continue (paroxysmal or permanent a-fib), he will need to be switched to anticoagulation  his atrial fibrillatoin 7. Risk factor modification 8. Telemetry monitoring 9. Frequent neuro checks 10. Statin 11. BP management   @Electronically  signed: Dr. Kerney Elbe  10/31/2020, 7:19 PM

## 2020-10-31 NOTE — Progress Notes (Signed)
Pharmacy Antibiotic Note  Austin Andrade is a 70 y.o. male admitted on 10/30/2020 with AMS and renal failure s/p fall, concern for aspiration. Pharmacy has been consulted for Unasyn dosing. Pt with rhabdo and AKI, CrCl ~12.  Plan: Reduce Unasyn to 3g IV q24h  Height: 5\' 8"  (172.7 cm) Weight: 67.8 kg (149 lb 7.6 oz) IBW/kg (Calculated) : 68.4  Temp (24hrs), Avg:97.9 F (36.6 C), Min:96.6 F (35.9 C), Max:98.5 F (36.9 C)  Recent Labs  Lab 10/30/20 0612 10/30/20 0650 10/30/20 1143 10/30/20 1854 10/30/20 2003 10/30/20 2201 10/31/20 0238  WBC 12.9*  --   --   --   --   --  10.2  CREATININE 5.51*   < > 5.52* 5.10* 5.30* 5.34* 5.22*  LATICACIDVEN  --   --  1.2  --   --  1.1 1.0   < > = values in this interval not displayed.    Estimated Creatinine Clearance: 12.6 mL/min (A) (by C-G formula based on SCr of 5.22 mg/dL (H)).    No Known Allergies  Arrie Senate, PharmD, BCPS, [facility] Clinical Pharmacist [phone] Please check AMION for all [place] numbers 10/31/2020

## 2020-10-31 NOTE — Progress Notes (Signed)
PATIENT ID: Austin Andrade  MRN: [id number]  DOB/AGE:  16-Aug-1950 / 70 y.o.  1 Day Post-Op Procedure(s) (LRB): IRRIGATION AND DEBRIDEMENT LEFT ANKLE (Left) EXTERNAL FIXATION LEFT ANKLE (Left)  Subjective: Patient unable to be awakened during rounds. Limited response to pain stimulation.   Objective: Vital signs in last 24 hours: Temp:  [96.6 F (35.9 C)-98.5 F (36.9 C)] 96.6 F (35.9 C) (07/16 0618) Pulse Rate:  [61-137] 64 (07/16 0700) Resp:  [10-23] 14 (07/16 0700) BP: (86-143)/(48-110) 96/56 (07/16 0700) SpO2:  [90 %-100 %] 97 % (07/16 0700) Weight:  [67.8 kg] 67.8 kg (07/16 0500)  Intake/Output from previous day: 07/15 0701 - 07/16 0700 In: 3724.6 [I.V.:2963.4; IV Piggyback:761.3] Out: 3710 [Urine:1000; Blood:25] Intake/Output this shift: No intake/output data recorded.  Recent Labs    10/30/20 1958 10/30/20 2003 10/31/20 0238 10/31/20 0428 10/31/20 0618  HGB 10.5* 10.2* 10.2* 9.9* 9.5*   Recent Labs    10/30/20 0612 10/30/20 0650 10/31/20 0238 10/31/20 0428 10/31/20 0618  WBC 12.9*  --  10.2  --   --   RBC 3.17*  --  2.86*  --   --   HCT 33.4*   < > 29.7* 29.0* 28.0*  PLT 122*  --  93*  --   --    < > = values in this interval not displayed.   Recent Labs    10/30/20 2201 10/31/20 0238 10/31/20 0428 10/31/20 0618  NA 134* 132* 133* 134*  K 4.8 5.0 5.0 4.8  CL 100 100  --   --   CO2 15* 15*  --   --   BUN 78* 76*  --   --   CREATININE 5.34* 5.22*  --   --   GLUCOSE 87 133*  --   --   CALCIUM 7.0* 6.9*  --   --    Recent Labs    10/30/20 1143 10/31/20 0502  INR 1.4* 1.5*    Physical Exam: Intact pulses distally Dorsiflexion/Plantar flexion intact- passive movement of foot initiates grimace and slight dorsiflexion and planter flexion of foot as well as movement of toes Incision: dressing C/D/I Compartment soft Ex Fix in place over left tibia DTI on left heel, no open wounds at this time on heel  Assessment/Plan: 1 Day Post-Op Procedure(s)  (LRB): IRRIGATION AND DEBRIDEMENT LEFT ANKLE (Left) EXTERNAL FIXATION LEFT ANKLE (Left)    Non Weight Bearing (NWB) LLE  VTE prophylaxis:  no anticoagulation at this time due to subdural hematoma  EF fix stable on left leg. Patient will likely remain in ex fix for 2-3 weeks until skin is healthy enough for transition to ORIF per trauma. We will consult trauma about taking over care. Wound care consult already placed for management of DTI.    Deriana Vanderhoef L. [place], PA-C 10/31/2020, 8:02 AM

## 2020-10-31 NOTE — Progress Notes (Signed)
[facility] Progress Note   Subjective:    S/p OR for L tibial fx.  Moved to ICU for AMS overnighht. I/Os 3.7 / 1L UOP.  BUN/Cr stable in 70/mid 5s.  K 5.   Objective Vitals:   10/31/20 0700 10/31/20 0800 10/31/20 0900 10/31/20 1100  BP: (!) 96/56 (!) 113/58 (!) 98/57   Pulse: 64 (!) 103 (!) 59   Resp: 14 18 11    Temp:  97.7 F (36.5 C)  97.7 F (36.5 C)  TempSrc:  Axillary    SpO2: 97% 97% 98%   Weight:      Height:       Physical Exam General: obtunded, slumped over, not responsive Heart: RRR Lungs: coarse ant, no rales Abdomen: soft, mild distended Extremities: L ankle external fix, no edema Neuro:  lethargic, cannot assess orientation  Additional Objective Labs: Basic Metabolic Panel: Recent Labs  Lab 10/30/20 1143 10/30/20 1420 10/30/20 2003 10/30/20 2201 10/31/20 0238 10/31/20 0428 10/31/20 0618  NA 128*   < > 135 134* 132* 133* 134*  K 4.9   < > 4.4 4.8 5.0 5.0 4.8  CL 95*   < > 103 100 100  --   --   CO2 15*  --   --  15* 15*  --   --   GLUCOSE 52*   < > 75 87 133*  --   --   BUN 75*   < > 77* 78* 76*  --   --   CREATININE 5.52*   < > 5.30* 5.34* 5.22*  --   --   CALCIUM 7.2*  --   --  7.0* 6.9*  --   --   PHOS  --   --   --  7.5*  --   --   --    < > = values in this interval not displayed.   Liver Function Tests: Recent Labs  Lab 10/30/20 1143 10/30/20 2201 10/31/20 0238  AST 1,114* 671* 500*  ALT 873* 673* 562*  ALKPHOS 100 90 79  BILITOT 2.1* 2.5* 2.3*  PROT 5.9* 6.1* 5.3*  ALBUMIN 3.0* 3.6 3.0*   Recent Labs  Lab 10/30/20 1143  LIPASE 26   CBC: Recent Labs  Lab 10/30/20 0612 10/30/20 0650 10/31/20 0238 10/31/20 0428 10/31/20 0618  WBC 12.9*  --  10.2  --   --   NEUTROABS  --   --  9.8*  --   --   HGB 11.3*   < > 10.2* 9.9* 9.5*  HCT 33.4*   < > 29.7* 29.0* 28.0*  MCV 105.4*  --  103.8*  --   --   PLT 122*  --  93*  --   --    < > = values in this interval not displayed.   Blood Culture    Component Value  Date/Time   SDES  05/23/2019 1958    BLOOD RIGHT ARM Performed at [facility], [place] [address] [place], [place] [address]    SPECREQUEST  05/23/2019 1958    BOTTLES DRAWN AEROBIC AND ANAEROBIC Blood Culture adequate volume Performed at [facility], [place] [address], [place], [place] [address]    CULT  05/23/2019 1958    NO GROWTH 5 DAYS Performed at [place] [address], [place], [place] [address]    REPTSTATUS 05/28/2019 FINAL 05/23/2019 1958    Cardiac Enzymes: Recent Labs  Lab 10/30/20 0612 10/30/20 1143 10/30/20 2201  10/31/20 0238  CKTOTAL 3,265* 3,634* 3,216* 2,340*   CBG: Recent Labs  Lab 10/30/20 2356 10/31/20 0217 10/31/20 0405 10/31/20 0746 10/31/20 1145  GLUCAP 108* 134* 181* 182* 194*   Iron Studies: No results for input(s): IRON, TIBC, TRANSFERRIN, FERRITIN in the last 72 hours. @lablastinr3 @ Studies/Results: DG Ankle 2 Views Right  Result Date: 10/30/2020 CLINICAL DATA:  Right ankle ex fix. EXAM: DG C-ARM 1-60 MIN; RIGHT ANKLE - 2 VIEW FLUOROSCOPY TIME:  Fluoroscopy Time:  20 seconds. Number of Acquired Spot Images: 2 COMPARISON:  10/30/2020. FINDINGS: Two C-arm fluoroscopic images were obtained intraoperatively and submitted for post operative interpretation. These images demonstrate external fixation of highly comminuted distal tibial and fibular fractures with fixation hardware only partially imaged. The alignment of the fractures appears improved with improved angulation. There is approximately half shaft width lateral displacement of the fibular fracture and slight (approximately 15%) medial displacement of the tibial fracture. These fractures were better characterized on prior radiographs. Please see the performing provider's procedural report for further detail. IMPRESSION: Intraoperative fluoroscopy, as detailed above. Electronically Signed   By: Margaretha Sheffield MD   On: 10/30/2020 19:20   CT HEAD WO  CONTRAST  Addendum Date: 10/30/2020   ADDENDUM REPORT: 10/30/2020 09:12 ADDENDUM: Study discussed by telephone with Dr. Dina Rich in the ED on 10/30/2020 at 0900 hours. Electronically Signed   By: Genevie Ann M.D.   On: 10/30/2020 09:12   Result Date: 10/30/2020 CLINICAL DATA:  70 year old male with mental status change status post fall, found down. EXAM: CT HEAD WITHOUT CONTRAST TECHNIQUE: Contiguous axial images were obtained from the base of the skull through the vertex without intravenous contrast. COMPARISON:  Brain MRI 01/18/2012.  Head CT 01/17/2012. FINDINGS: Brain: Chronic right PCA infarct which occurred in 2013. Small volume right side subdural collection is CSF density, and generally 3 mm in thickness. There is perhaps subtle mass effect on the right lateral ventricle but no midline shift. No hyperdense intracranial hemorrhage identified. Age indeterminate small but confluent hypodensity in the left occipital lobe white matter on series 3, image 14, and right superior cerebellum on image 12. Small chronic appearing left inferior cerebellar infarcts probably correspond to those on the 2013 MRI. Deep gray nuclei and brainstem appear to remain within normal limits. Basilar cisterns remain normal. Vascular: Calcified atherosclerosis at the skull base. Skull: Stable and intact.  No acute osseous abnormality identified. Sinuses/Orbits: Visualized paranasal sinuses and mastoids are stable and well aerated. Other: Left posterior convexity scalp hematoma measuring up to 6 mm on series 4, image 46. Underlying calvarium appears intact. More broad-based and subtle left anterior convexity scalp hematoma or contusion on image 48. Visualized orbit soft tissues are within normal limits. IMPRESSION: 1. Age indeterminate small infarcts in the left occipital , right superior cerebellum. No associated hemorrhage or mass effect. 2. Unrelated appearing small 2-3 mm low-density right side subdural collection. This is compatible  with age indeterminate subdural hematoma or hygroma (favor the latter). No associated midline shift. 3. Left inferior cerebellar artery territory. 4. Superimposed chronic right PCA and small left PICA territory infarcts. 5. Left scalp soft tissue injuries without underlying skull fracture. Electronically Signed: By: Genevie Ann M.D. On: 10/30/2020 08:58   CT CERVICAL SPINE WO CONTRAST  Result Date: 10/30/2020 CLINICAL DATA:  70 year old male with mental status change status post fall, found down. Head CT EXAM: CT CERVICAL SPINE WITHOUT CONTRAST TECHNIQUE: Multidetector CT imaging of the cervical spine was performed without intravenous contrast. Multiplanar CT image  reconstructions were also generated. COMPARISON:  Today reported separately. MRI cervical spine 10/06/2013. portable chest x-ray today. FINDINGS: Alignment: Reversal of cervical lordosis is new compared to 2015 multilevel degenerative appearing spondylolisthesis detailed below. Cervicothoracic junction alignment and posterior element alignment appears maintained. Skull base and vertebrae: Visualized skull base is intact. No atlanto-occipital dissociation. C1 and C2 appear intact and aligned. Chronic severe cervical spine degeneration, further detailed below. No convincing acute osseous abnormality identified. Soft tissues and spinal canal: No prevertebral fluid or swelling. No visible canal hematoma. Disc levels: Chronic cervical ACDF C5-C6 and C6-C7 is new since 2015 although with superimposed bulky chronic anterior endplate osteophytosis at those levels. Solid arthrodesis at C5-C6. Scant if any arthrodesis at C6-C7. 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 Mild and possibly moderate spinal stenosis at the former. Superimposed mild anterolisthesis at both C2-C3 and C3-C4. Upper chest: Visible upper thoracic levels appear intact. Motion artifact but abnormal Patchy and confluent mostly  ground-glass opacity in both upper lobes on series 5, image 94. IMPRESSION: 1. No definite acute traumatic injury identified in the cervical spine. 2. Chronic very severe cervical spine degeneration, with prior ACDF C5-C6 and C6-C7. Probable pseudoarthrosis at the latter. Severe adjacent segment disease at both C4-C5 and C7-T1, with Mild to Moderate Spinal Stenosis possible at the former. 3. Patchy bilateral ground-glass opacity in the lung apices, remaining suspicious for acute viral/atypical respiratory infection in conjunction with the portable chest today. Electronically Signed   By: Genevie Ann M.D.   On: 10/30/2020 09:11   US RENAL  Result Date: 10/30/2020 CLINICAL DATA:  Acute kidney injury. EXAM: RENAL / URINARY TRACT ULTRASOUND COMPLETE COMPARISON:  None. FINDINGS: Right Kidney: Renal measurements: 11.8 x 5.3 x 5.0 cm = volume: 160.5 mL. Echogenicity within normal limits. No mass or hydronephrosis visualized. Left Kidney: Renal measurements: 11.4 x 6.8 x 4.9 cm = volume: 198.0 mL. Echogenicity within normal limits. No mass or hydronephrosis visualized. Bladder: Diffuse bladder distension measuring 15.2 x 8.8 by 12.0 cm (volume = 840 cm^3). Other: None. IMPRESSION: 1. Echogenicity within normal limits. No mass or hydronephrosis visualized. 2. Diffuse urinary bladder distension with a volume of approximately 140 cc. Electronically Signed   By: Kerby Moors M.D.   On: 10/30/2020 16:53   CT ANKLE LEFT WO CONTRAST  Result Date: 10/31/2020 CLINICAL DATA:  Open ankle fracture. EXAM: CT OF THE LEFT ANKLE WITHOUT CONTRAST TECHNIQUE: Multidetector CT imaging of the left ankle was performed according to the standard protocol. Multiplanar CT image reconstructions were also generated. COMPARISON:  Left ankle x-rays from yesterday. FINDINGS: Bones/Joint/Cartilage Acute highly comminuted fracture of the distal tibial metadiaphysis without intra-articular extension. The dominant fracture fragments are medially  displaced 9 mm. There are multiple impacted small bone fragments. Acute highly comminuted fracture of the distal fibular diaphysis. The dominant fracture fragments are medially displaced up to 5 mm there is oblique longitudinal extension of the fracture line through the anterior tip of the lateral malleolus (series 3, image 119). The ankle mortise is symmetric.  The talar dome is intact. Tiny avulsion fracture of the lateral talar process (series 3, image 131). Acute nondisplaced fractures through the second and third metatarsal bases (series 3, image 166). Tiny avulsion fractures of the dorsal medial first metatarsal base and distal medial plantar aspect of the medial cuneiform (series 9, image 64). Lisfranc alignment is grossly maintained. External fixation screw through the calcaneus. Joint spaces are preserved. No joint effusion. Osteopenia. Ligaments  Ligaments are suboptimally evaluated by CT. Muscles and Tendons Grossly intact. Multiple small bone fragments surround the extensor hallucis longus and extensor digitorum longus tendons. Soft tissue Diffuse soft tissue swelling. Small amount of subcutaneous emphysema in the anterior distal lower leg. No fluid collection or hematoma. No soft tissue mass. IMPRESSION: 1. Acute highly comminuted fractures of the distal tibia and fibula as described above. No involvement of the tibial plafond and. 2. Tiny avulsion fractures of the lateral talar process, first metatarsal base, and medial cuneiform. Acute nondisplaced fractures through the second and third metatarsal bases. 3. Lisfranc alignment is grossly maintained. Electronically Signed   By: Titus Dubin M.D.   On: 10/31/2020 09:50   DG Chest Port 1 View  Result Date: 10/30/2020 CLINICAL DATA:  70 year old male status post fall, found down. Confusion. Renal failure. EXAM: PORTABLE CHEST 1 VIEW COMPARISON:  Portable chest 05/23/2019 and earlier. FINDINGS: Portable AP semi upright view at 0701 hours. Lower lung  volumes. Mediastinal contours remain normal. Chronic cervical ACDF. Visualized tracheal air column is within normal limits. New indistinct patchy and interstitial opacity scattered in both lungs, bilateral upper lobes and left lower lung. Right lung base less affected. No superimposed pneumothorax. No pulmonary edema suspected. No pleural effusion or consolidation. Chronic left clavicle deformity. No acute osseous abnormality identified. Negative visible bowel gas pattern. IMPRESSION: Patchy and confluent new bilateral pulmonary opacity since last year most suggestive of bilateral pneumonia. Consider viral/atypical etiology. Electronically Signed   By: Genevie Ann M.D.   On: 10/30/2020 07:25   DG Ankle Left Port  Result Date: 10/30/2020 CLINICAL DATA:  Postop EXAM: PORTABLE LEFT ANKLE - 2 VIEW COMPARISON:  10/30/2020 FINDINGS: Interval placement of external fixation device. Highly comminuted fractures involving the distal tibia and fibula with multiple displaced bone fragments. Gas in the soft tissues redemonstrated. IMPRESSION: Interval placement of external fixation device across highly comminuted distal tibial and fibular fractures Electronically Signed   By: Donavan Foil M.D.   On: 10/30/2020 21:34   DG Ankle Left Port  Result Date: 10/30/2020 CLINICAL DATA:  70 year old male status post fall.  Found down. EXAM: PORTABLE LEFT ANKLE - 2 VIEW COMPARISON:  None. FINDINGS: Splint or cast material about the left ankle. Severely comminuted fracture of the distal left tibia metadiaphysis. This seems to spare the articular surfaces. Similar highly comminuted fracture of the distal left fibula shaft near the metadiaphysis. Both fractures demonstrate medial and mild anterior angulation. Talar dome and calcaneus appear intact. There is associated abnormal soft tissue gas anteriorly and medial to the tibia. IMPRESSION: 1. Splint or cast material in place. 2. Severely comminuted distal left tibia metadiaphysis fracture,  seems to spare the plafond, but overlying soft tissue gas suspicious for OPEN fracture. 3. Similar highly comminuted fracture distal left fibula just proximal to the metadiaphysis. 4. Preserved mortise joint alignment. Electronically Signed   By: Genevie Ann M.D.   On: 10/30/2020 07:28   DG C-Arm 1-60 Min  Result Date: 10/30/2020 CLINICAL DATA:  Right ankle ex fix. EXAM: DG C-ARM 1-60 MIN; RIGHT ANKLE - 2 VIEW FLUOROSCOPY TIME:  Fluoroscopy Time:  20 seconds. Number of Acquired Spot Images: 2 COMPARISON:  10/30/2020. FINDINGS: Two C-arm fluoroscopic images were obtained intraoperatively and submitted for post operative interpretation. These images demonstrate external fixation of highly comminuted distal tibial and fibular fractures with fixation hardware only partially imaged. The alignment of the fractures appears improved with improved angulation. There is approximately half shaft width lateral displacement of  the fibular fracture and slight (approximately 15%) medial displacement of the tibial fracture. These fractures were better characterized on prior radiographs. Please see the performing provider's procedural report for further detail. IMPRESSION: Intraoperative fluoroscopy, as detailed above. Electronically Signed   By: Margaretha Sheffield MD   On: 10/30/2020 19:20   VAS [place] LOWER EXTREMITY ARTERIAL DUPLEX  Result Date: 10/30/2020 LOWER EXTREMITY ARTERIAL DUPLEX STUDY Patient Name:  Austin Andrade  Date of Exam:   10/30/2020 Medical Rec #: [id number]     Accession #:    [id number] Date of Birth: Apr 10, 1951     Patient Gender: M Patient Age:   69Y Exam Location:  [facility] Procedure:      VAS [place] LOWER EXTREMITY ARTERIAL DUPLEX Referring Phys: [id number] [place] --------------------------------------------------------------------------------  Indications: Left ankle fracture, difficulty palpating pulses. High Risk Factors: Hypertension, hyperlipidemia, current smoker. Other Factors: Chronic  Kidney Disease.  Current ABI: N/A Limitations: Bandaging and hard cast at ankle, recent fracture. Comparison Study: No prior studes. Performing Technologist: Darlin Coco RDMS,RVT  Examination Guidelines: A complete evaluation includes B-mode imaging, spectral Doppler, color Doppler, and power Doppler as needed of all accessible portions of each vessel. Bilateral testing is considered an integral part of a complete examination. Limited examinations for reoccurring indications may be performed as noted.   +-----------+--------+-----+--------+---------+--------------------------------+ LEFT       PSV cm/sRatioStenosisWaveform Comments                         +-----------+--------+-----+--------+---------+--------------------------------+ CFA Mid    103                  triphasic                                 +-----------+--------+-----+--------+---------+--------------------------------+ DFA        64                   biphasic                                  +-----------+--------+-----+--------+---------+--------------------------------+ SFA Prox   78                   triphasic                                 +-----------+--------+-----+--------+---------+--------------------------------+ SFA Mid    74                   triphasic                                 +-----------+--------+-----+--------+---------+--------------------------------+ SFA Distal 65                   triphasic                                 +-----------+--------+-----+--------+---------+--------------------------------+ POP Prox   53                   biphasic                                  +-----------+--------+-----+--------+---------+--------------------------------+  POP Distal 93                   triphasic                                 +-----------+--------+-----+--------+---------+--------------------------------+ TP Trunk   90                   triphasic                                  +-----------+--------+-----+--------+---------+--------------------------------+ ATA Distal                               Bandaging- unable to visualize   +-----------+--------+-----+--------+---------+--------------------------------+ PTA Distal 28                   biphasic Bandaging- limited visualization +-----------+--------+-----+--------+---------+--------------------------------+ PERO Distal                              Bandaging- unable to visualize   +-----------+--------+-----+--------+---------+--------------------------------+ DP                                       Bandaging- unable to visualize   +-----------+--------+-----+--------+---------+--------------------------------+  Summary: Left: All visualized arteries are patent without evidence of stenosis.  See table(s) above for measurements and observations. Electronically signed by Monica [place] MD on 10/30/2020 at 2:44:42 PM.    Final    Medications:  [START ON 11/01/2020] ampicillin-sulbactam (UNASYN) IV     lactated ringers 300 mL/hr at 10/31/20 0900    Chlorhexidine Gluconate Cloth  6 each Topical Q0600   mupirocin ointment  1 application Nasal BID   sodium chloride flush  3 mL Intravenous Q12H   thiamine  100 mg Oral Daily   Or   thiamine  100 mg Intravenous Daily   Assessment/Plan: **Encephalopathy: multifactorial, suspect EtOH withdrawal and meds contributing.  Old findings on CT, neurosurgery eval - NTD.   **Severe AKI, nonoliguric: Multifactorial. Urinary retention noted on renal US > 879mL, no hydro on [place]; foley in place. UA not consistent with GN.  UNa 67, FeNa 7.8% c/w post renal.  CK ~3600.  Rec'd volume resuscitation on admission and  is now +3.7L; dec fluids currently on 260mL/hr (LR + bicarb gtt) to 163mL/hr (just bicarb).  No current indications for RRT but high risk for developing in the next 48h.   If develops pulmonary edema d/c IVF and give high dose lasix.  Discussed when he was alert - If absolutely necessary would accept HD.    **Anemia:  Hb 9s, no active bleeding.  Transfuse <7.  No indication for ESA currently.    **AGMA: normal lactate, presume due to AKI.  ^ isotonic bicarb gtt from 75 to 125h and d/c LR today.   **EtOH misuse:  watching for withdrawal.    **aspiration PNA: on unasyn  **A fib/fl:  plans for anticoag when able.    Will follow.   Jannifer Hick MD 10/31/2020, 11:48 AM  [facility] Pager: [phone]

## 2020-10-31 NOTE — Progress Notes (Signed)
SLP Cancellation Note  Patient Details Name: Austin Andrade MRN: [id number] DOB: [birth year]   Cancelled treatment:        Pt has not been and is not currently responsive for swallow assessment. Will check back tomorrow.    Houston Siren 10/31/2020, 8:50 AM Orbie Pyo Colvin Caroli.Ed Risk analyst [phone] Office [phone]

## 2020-10-31 NOTE — Plan of Care (Signed)

## 2020-10-31 NOTE — Progress Notes (Signed)
[facility] Progress Note Patient Name: Austin Andrade DOB: 06-22-1950 MRN: [id number]   Date of Service  10/31/2020  HPI/Events of Note  Patient admitted to the medical floor  with altered mental status and a left tib-fib fracture, he has a history of alcoholism, he underwent OREF of the fracture yesterday, he was transferred to the ICU over night secondary to progressive obtundation. He also has acute renal failure likely secondary to Rhabdomyolysis.  eICU Interventions  New Patient Evaluation completed.        [name] 10/31/2020, 5:15 AM

## 2020-10-31 NOTE — Progress Notes (Signed)
Johnney Ou MD verbally ordered for LR to be discontinued & Bicarb gtt to be increased to 164ml/hr. Orders updated.

## 2020-10-31 NOTE — Significant Event (Addendum)
Rapid Response Event Note   Reason for Call :  Decreased LOC  Initial Focused Assessment:  Pt lying in bed with eyes close. He will only respond to deep painful stimulation. With deep painful stimulation, he will move all extremities and grimace. He does not have a gag and is drooling. Pupils 2, equal, and sluggish. Lungs clear/diminished. Skin warm and dry.   T-98.5, HR-62, BP-86/51, RR-15, SpO2-95% on 4L [place].  Interventions:  CBG-134 VBG(already ordered lab)-7.153/47.9/16.1 CBCD/BMP/CK/LA(already ordered lab) PCCM consulted: Pt tx to [place] of Care:  Tx to 2H25   Event Summary:   MD Notified: Dr. Darrick Meigs notified and came to bedside Call [place], Austin Reuss Anderson, RN
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
## 2020-10-31 NOTE — Progress Notes (Signed)
[facility] Progress Note Patient Name: Austin Andrade DOB: 1950/08/30 MRN: [id number]   Date of Service  10/31/2020  HPI/Events of Note  Patient with new onset Afib with RVR, hemodynamically stable currently. No recent BMP or magnesium levels and his ionized calcium was 0.98 this morning. QTc  0.408  eICU Interventions  Amiodarone 150 mg iv bolus x 1, stat BMP and Mg++ ordered, Calcium gluconate 2 gm iv bolus x 1.        Kerry Kass Cammy Sanjurjo 10/31/2020, 10:14 PM

## 2020-11-01 ENCOUNTER — Inpatient Hospital Stay (HOSPITAL_COMMUNITY): Payer: Medicare Other

## 2020-11-01 DIAGNOSIS — I6389 Other cerebral infarction: Secondary | ICD-10-CM | POA: Diagnosis not present

## 2020-11-01 DIAGNOSIS — I639 Cerebral infarction, unspecified: Secondary | ICD-10-CM | POA: Diagnosis not present

## 2020-11-01 DIAGNOSIS — I6300 Cerebral infarction due to thrombosis of unspecified precerebral artery: Secondary | ICD-10-CM

## 2020-11-01 DIAGNOSIS — R4182 Altered mental status, unspecified: Secondary | ICD-10-CM | POA: Diagnosis not present

## 2020-11-01 LAB — CBC WITH DIFFERENTIAL/PLATELET
Abs Immature Granulocytes: 0.07 10*3/uL (ref 0.00–0.07)
Basophils Absolute: 0 10*3/uL (ref 0.0–0.1)
Basophils Relative: 0 %
Eosinophils Absolute: 0 10*3/uL (ref 0.0–0.5)
Eosinophils Relative: 0 %
HCT: 29.9 % — ABNORMAL LOW (ref 39.0–52.0)
Hemoglobin: 10.5 g/dL — ABNORMAL LOW (ref 13.0–17.0)
Immature Granulocytes: 1 %
Lymphocytes Relative: 2 %
Lymphs Abs: 0.2 10*3/uL — ABNORMAL LOW (ref 0.7–4.0)
MCH: 35.1 pg — ABNORMAL HIGH (ref 26.0–34.0)
MCHC: 35.1 g/dL (ref 30.0–36.0)
MCV: 100 fL (ref 80.0–100.0)
Monocytes Absolute: 0.6 10*3/uL (ref 0.1–1.0)
Monocytes Relative: 5 %
Neutro Abs: 11.8 10*3/uL — ABNORMAL HIGH (ref 1.7–7.7)
Neutrophils Relative %: 92 %
Platelets: 104 10*3/uL — ABNORMAL LOW (ref 150–400)
RBC: 2.99 MIL/uL — ABNORMAL LOW (ref 4.22–5.81)
RDW: 14.5 % (ref 11.5–15.5)
WBC: 12.7 10*3/uL — ABNORMAL HIGH (ref 4.0–10.5)
nRBC: 0.2 % (ref 0.0–0.2)

## 2020-11-01 LAB — BASIC METABOLIC PANEL
Anion gap: 14 (ref 5–15)
BUN: 74 mg/dL — ABNORMAL HIGH (ref 8–23)
CO2: 27 mmol/L (ref 22–32)
Calcium: 7.6 mg/dL — ABNORMAL LOW (ref 8.9–10.3)
Chloride: 95 mmol/L — ABNORMAL LOW (ref 98–111)
Creatinine, Ser: 4.82 mg/dL — ABNORMAL HIGH (ref 0.61–1.24)
GFR, Estimated: 12 mL/min — ABNORMAL LOW (ref >60)
Glucose, Bld: 164 mg/dL — ABNORMAL HIGH (ref 70–99)
Potassium: 3.8 mmol/L (ref 3.5–5.1)
Sodium: 136 mmol/L (ref 135–145)

## 2020-11-01 LAB — ECHOCARDIOGRAM COMPLETE
Area-P 1/2: 3.21 cm2
Height: 68 in
MV M vel: 5.77 m/s
MV Peak grad: 133.2 mmHg
P 1/2 time: 475 msec
Radius: 0.4 cm
S' Lateral: 3 cm
Weight: 2352.75 oz

## 2020-11-01 LAB — COMPREHENSIVE METABOLIC PANEL
ALT: 336 U/L — ABNORMAL HIGH (ref 0–44)
AST: 247 U/L — ABNORMAL HIGH (ref 15–41)
Albumin: 3.1 g/dL — ABNORMAL LOW (ref 3.5–5.0)
Alkaline Phosphatase: 79 U/L (ref 38–126)
Anion gap: 14 (ref 5–15)
BUN: 74 mg/dL — ABNORMAL HIGH (ref 8–23)
CO2: 27 mmol/L (ref 22–32)
Calcium: 7.6 mg/dL — ABNORMAL LOW (ref 8.9–10.3)
Chloride: 94 mmol/L — ABNORMAL LOW (ref 98–111)
Creatinine, Ser: 4.9 mg/dL — ABNORMAL HIGH (ref 0.61–1.24)
GFR, Estimated: 12 mL/min — ABNORMAL LOW (ref >60)
Glucose, Bld: 173 mg/dL — ABNORMAL HIGH (ref 70–99)
Potassium: 3.8 mmol/L (ref 3.5–5.1)
Sodium: 135 mmol/L (ref 135–145)
Total Bilirubin: 1.3 mg/dL — ABNORMAL HIGH (ref 0.3–1.2)
Total Protein: 5.7 g/dL — ABNORMAL LOW (ref 6.5–8.1)

## 2020-11-01 LAB — GLUCOSE, CAPILLARY
Glucose-Capillary: 113 mg/dL — ABNORMAL HIGH (ref 70–99)
Glucose-Capillary: 155 mg/dL — ABNORMAL HIGH (ref 70–99)
Glucose-Capillary: 198 mg/dL — ABNORMAL HIGH (ref 70–99)
Glucose-Capillary: 233 mg/dL — ABNORMAL HIGH (ref 70–99)
Glucose-Capillary: 75 mg/dL (ref 70–99)
Glucose-Capillary: 81 mg/dL (ref 70–99)
Glucose-Capillary: 87 mg/dL (ref 70–99)

## 2020-11-01 LAB — MAGNESIUM: Magnesium: 2 mg/dL (ref 1.7–2.4)

## 2020-11-01 LAB — URINE CULTURE: Culture: NO GROWTH

## 2020-11-01 LAB — PROTIME-INR
INR: 1.2 (ref 0.8–1.2)
Prothrombin Time: 15.6 seconds — ABNORMAL HIGH (ref 11.4–15.2)

## 2020-11-01 LAB — LIPID PANEL
Cholesterol: 129 mg/dL (ref 0–200)
HDL: 36 mg/dL — ABNORMAL LOW (ref >40)
LDL Cholesterol: 76 mg/dL (ref 0–99)
Total CHOL/HDL Ratio: 3.6 RATIO
Triglycerides: 83 mg/dL (ref <150)
VLDL: 17 mg/dL (ref 0–40)

## 2020-11-01 LAB — HEMOGLOBIN A1C
Hgb A1c MFr Bld: 6.4 % — ABNORMAL HIGH (ref 4.8–5.6)
Mean Plasma Glucose: 136.98 mg/dL

## 2020-11-01 MED ORDER — GUAIFENESIN-DM 100-10 MG/5ML PO SYRP
5.0000 mL | ORAL_SOLUTION | ORAL | Status: DC | PRN
  Administered 2020-11-01 – 2020-11-08 (×14): 5 mL via ORAL
  Filled 2020-11-01 (×15): qty 5

## 2020-11-01 MED ORDER — METOPROLOL TARTRATE 5 MG/5ML IV SOLN
5.0000 mg | INTRAVENOUS | Status: AC | PRN
  Administered 2020-11-01 – 2020-11-03 (×3): 5 mg via INTRAVENOUS
  Filled 2020-11-01 (×3): qty 5

## 2020-11-01 MED ORDER — METOPROLOL TARTRATE 25 MG PO TABS
25.0000 mg | ORAL_TABLET | Freq: Two times a day (BID) | ORAL | Status: DC
  Administered 2020-11-01 – 2020-11-03 (×5): 25 mg via ORAL
  Filled 2020-11-01 (×5): qty 1

## 2020-11-01 NOTE — Progress Notes (Signed)
[facility] Progress Note   Subjective:    Mental status improving, off CIWA, transfer to floor.  I/Os 2.7 / 1.5L UOP.  BUN stable in 70s, Cr high 4s still.  K 3.8.  Denies dysgeusia, nausea, pruritis.  Objective Vitals:   11/01/20 0500 11/01/20 0600 11/01/20 0700 11/01/20 0800  BP: 125/82 134/71 139/73 (!) 147/78  Pulse: 78 78 86 (!) 136  Resp: (!) 24 14 17  (!) 23  Temp:   97.9 F (36.6 C)   TempSrc:      SpO2: 95% 98% 93% 93%  Weight:      Height:       Physical Exam General: awake an dinteractive Heart: irreg irreg, no rub Lungs: coarse ant, no rales Abdomen: soft, mild distended Extremities: L ankle external fix, no edema Neuro:  awake, alert, following commands, oriented  Additional Objective Labs: Basic Metabolic Panel: Recent Labs  Lab 10/30/20 2201 10/31/20 0238 10/31/20 0428 10/31/20 0618 11/01/20 0044  NA 134* 132* 133* 134* 135  136  K 4.8 5.0 5.0 4.8 3.8  3.8  CL 100 100  --   --  94*  95*  CO2 15* 15*  --   --  27  27  GLUCOSE 87 133*  --   --  173*  164*  BUN 78* 76*  --   --  74*  74*  CREATININE 5.34* 5.22*  --   --  4.90*  4.82*  CALCIUM 7.0* 6.9*  --   --  7.6*  7.6*  PHOS 7.5*  --   --   --   --     Liver Function Tests: Recent Labs  Lab 10/30/20 2201 10/31/20 0238 11/01/20 0044  AST 671* 500* 247*  ALT 673* 562* 336*  ALKPHOS 90 79 79  BILITOT 2.5* 2.3* 1.3*  PROT 6.1* 5.3* 5.7*  ALBUMIN 3.6 3.0* 3.1*    Recent Labs  Lab 10/30/20 1143  LIPASE 26    CBC: Recent Labs  Lab 10/30/20 0612 10/30/20 0650 10/31/20 0238 10/31/20 0428 10/31/20 0618 11/01/20 0044  WBC 12.9*  --  10.2  --   --  12.7*  NEUTROABS  --   --  9.8*  --   --  11.8*  HGB 11.3*   < > 10.2* 9.9* 9.5* 10.5*  HCT 33.4*   < > 29.7* 29.0* 28.0* 29.9*  MCV 105.4*  --  103.8*  --   --  100.0  PLT 122*  --  93*  --   --  104*   < > = values in this interval not displayed.    Blood Culture    Component Value Date/Time   SDES   05/23/2019 1958    BLOOD RIGHT ARM Performed at [facility], [place] [address], [place], [place] [address]    SPECREQUEST  05/23/2019 1958    BOTTLES DRAWN AEROBIC AND ANAEROBIC Blood Culture adequate volume Performed at [facility], [place] [address] [place], [place] [address]    CULT  05/23/2019 1958    NO GROWTH 5 DAYS Performed at [facility] [address], [place],  [address]    REPTSTATUS 05/28/2019 FINAL 05/23/2019 1958    Cardiac Enzymes: Recent Labs  Lab 10/30/20 0612 10/30/20 1143 10/30/20 2201 10/31/20 0238  CKTOTAL 3,265* 3,634* 3,216* 2,340*    CBG: Recent Labs  Lab 10/31/20 1145 10/31/20 1655 10/31/20 1936 11/01/20 0002 11/01/20 0404  GLUCAP 194* 195* 177* 198* 81  Iron Studies: No results for input(s): IRON, TIBC, TRANSFERRIN, FERRITIN in the last 72 hours. @[email or web address] @ Studies/Results: DG Ankle 2 Views Right  Result Date: 10/30/2020 CLINICAL DATA:  Right ankle ex fix. EXAM: DG C-ARM 1-60 MIN; RIGHT ANKLE - 2 VIEW FLUOROSCOPY TIME:  Fluoroscopy Time:  20 seconds. Number of Acquired Spot Images: 2 COMPARISON:  10/30/2020. FINDINGS: Two C-arm fluoroscopic images were obtained intraoperatively and submitted for post operative interpretation. These images demonstrate external fixation of highly comminuted distal tibial and fibular fractures with fixation hardware only partially imaged. The alignment of the fractures appears improved with improved angulation. There is approximately half shaft width lateral displacement of the fibular fracture and slight (approximately 15%) medial displacement of the tibial fracture. These fractures were better characterized on prior radiographs. Please see the performing provider's procedural report for further detail. IMPRESSION: Intraoperative fluoroscopy, as detailed above. Electronically Signed   By: Margaretha Sheffield MD   On: 10/30/2020 19:20   MR Brain Wo Contrast (neuro  protocol)  Result Date: 10/31/2020 CLINICAL DATA:  Neuro deficit, acute, stroke suspected. Encephalopathy. Multifactorial. Suspect alcohol withdrawal. EXAM: MRI HEAD WITHOUT CONTRAST TECHNIQUE: Multiplanar, multiecho pulse sequences of the brain and surrounding structures were obtained without intravenous contrast. COMPARISON:  CT head without contrast 09/30/2020. MR head without contrast 01/18/2012 FINDINGS: Brain: Multifocal areas are present. Of acute nonhemorrhagic infarction mm focus present in the right superior cerebellum. Remote medial right occipital lobe infarcts encephalomalacia again noted. Posterior and lateral to this is a focal area of restricted diffusion. White matter infarct in the posterior left temporal and parietal lobe measures up to 18 mm. Focal cortical infarct is noted in the anterior right frontal lobe. Right parietal subcortical white matter infarct present. T2 and FLAIR signal changes are associated with each of these lesions. No acute hemorrhage mass lesion present. No other significant white matter changes are present. The ventricles are of normal size. Thin extra-axial collection is again noted on the right with near CSF intensity. No fluid levels are present. The internal auditory canals are within normal limits. Remote lacunar infarcts are present in the inferior cerebellum bilaterally, left greater than right. Vascular: Flow is present in the major intracranial arteries. Skull and upper cervical spine: The craniocervical junction is normal. Upper cervical spine is within normal limits. Marrow signal is unremarkable. Sinuses/Orbits: Mild mucosal thickening is present in the left maxillary sinus. There is some fluid in the mastoid air cells bilaterally. The paranasal sinuses and mastoid air cells are otherwise clear. The globes and orbits are within normal limits. IMPRESSION: 1. Multifocal acute nonhemorrhagic infarcts. Probable central source. 2. Thin extra-axial collection on the  right is most consistent with a subdural hygroma. 3. Acute nonhemorrhagic infarction involving the right superior cerebellum. 4. Acute/subacute nonhemorrhagic infarct involving the posterior left temporal and parietal lobe measuring up to 18 mm. 5. Additional acute nonhemorrhagic infarcts in the anterior right frontal cortex and high right parietal white matter small focus of acute nonhemorrhagic infarct in the posterior right occipital lobe, adjacent to the area chronic encephalomalacia. 6. Remote medial right occipital lobe infarct. 7. Remote lacunar infarcts of the inferior cerebellum bilaterally, left greater than right. Electronically Signed   By: San Morelle M.D.   On: 10/31/2020 16:15   US RENAL  Result Date: 10/30/2020 CLINICAL DATA:  Acute kidney injury. EXAM: RENAL / URINARY TRACT ULTRASOUND COMPLETE COMPARISON:  None. FINDINGS: Right Kidney: Renal measurements: 11.8 x 5.3 x 5.0 cm = volume: 160.5 mL. Echogenicity within normal limits. No mass  or hydronephrosis visualized. Left Kidney: Renal measurements: 11.4 x 6.8 x 4.9 cm = volume: 198.0 mL. Echogenicity within normal limits. No mass or hydronephrosis visualized. Bladder: Diffuse bladder distension measuring 15.2 x 8.8 by 12.0 cm (volume = 840 cm^3). Other: None. IMPRESSION: 1. Echogenicity within normal limits. No mass or hydronephrosis visualized. 2. Diffuse urinary bladder distension with a volume of approximately 140 cc. Electronically Signed   By: Kerby Moors M.D.   On: 10/30/2020 16:53   CT ANKLE LEFT WO CONTRAST  Result Date: 10/31/2020 CLINICAL DATA:  Open ankle fracture. EXAM: CT OF THE LEFT ANKLE WITHOUT CONTRAST TECHNIQUE: Multidetector CT imaging of the left ankle was performed according to the standard protocol. Multiplanar CT image reconstructions were also generated. COMPARISON:  Left ankle x-rays from yesterday. FINDINGS: Bones/Joint/Cartilage Acute highly comminuted fracture of the distal tibial metadiaphysis without  intra-articular extension. The dominant fracture fragments are medially displaced 9 mm. There are multiple impacted small bone fragments. Acute highly comminuted fracture of the distal fibular diaphysis. The dominant fracture fragments are medially displaced up to 5 mm there is oblique longitudinal extension of the fracture line through the anterior tip of the lateral malleolus (series 3, image 119). The ankle mortise is symmetric.  The talar dome is intact. Tiny avulsion fracture of the lateral talar process (series 3, image 131). Acute nondisplaced fractures through the second and third metatarsal bases (series 3, image 166). Tiny avulsion fractures of the dorsal medial first metatarsal base and distal medial plantar aspect of the medial cuneiform (series 9, image 64). Lisfranc alignment is grossly maintained. External fixation screw through the calcaneus. Joint spaces are preserved. No joint effusion. Osteopenia. Ligaments Ligaments are suboptimally evaluated by CT. Muscles and Tendons Grossly intact. Multiple small bone fragments surround the extensor hallucis longus and extensor digitorum longus tendons. Soft tissue Diffuse soft tissue swelling. Small amount of subcutaneous emphysema in the anterior distal lower leg. No fluid collection or hematoma. No soft tissue mass. IMPRESSION: 1. Acute highly comminuted fractures of the distal tibia and fibula as described above. No involvement of the tibial plafond and. 2. Tiny avulsion fractures of the lateral talar process, first metatarsal base, and medial cuneiform. Acute nondisplaced fractures through the second and third metatarsal bases. 3. Lisfranc alignment is grossly maintained. Electronically Signed   By: Titus Dubin M.D.   On: 10/31/2020 09:50   DG Ankle Left Port  Result Date: 10/30/2020 CLINICAL DATA:  Postop EXAM: PORTABLE LEFT ANKLE - 2 VIEW COMPARISON:  10/30/2020 FINDINGS: Interval placement of external fixation device. Highly comminuted  fractures involving the distal tibia and fibula with multiple displaced bone fragments. Gas in the soft tissues redemonstrated. IMPRESSION: Interval placement of external fixation device across highly comminuted distal tibial and fibular fractures Electronically Signed   By: Donavan Foil M.D.   On: 10/30/2020 21:34   DG C-Arm 1-60 Min  Result Date: 10/30/2020 CLINICAL DATA:  Right ankle ex fix. EXAM: DG C-ARM 1-60 MIN; RIGHT ANKLE - 2 VIEW FLUOROSCOPY TIME:  Fluoroscopy Time:  20 seconds. Number of Acquired Spot Images: 2 COMPARISON:  10/30/2020. FINDINGS: Two C-arm fluoroscopic images were obtained intraoperatively and submitted for post operative interpretation. These images demonstrate external fixation of highly comminuted distal tibial and fibular fractures with fixation hardware only partially imaged. The alignment of the fractures appears improved with improved angulation. There is approximately half shaft width lateral displacement of the fibular fracture and slight (approximately 15%) medial displacement of the tibial fracture. These fractures were better characterized on  prior radiographs. Please see the performing provider's procedural report for further detail. IMPRESSION: Intraoperative fluoroscopy, as detailed above. Electronically Signed   By: Margaretha Sheffield MD   On: 10/30/2020 19:20   Medications:  ampicillin-sulbactam (UNASYN) IV Stopped (11/01/20 0622)    Chlorhexidine Gluconate Cloth  6 each Topical Q0600   insulin aspart  1-3 Units Subcutaneous Q4H   metoprolol tartrate  25 mg Oral BID   mupirocin ointment  1 application Nasal BID   sodium chloride flush  3 mL Intravenous Q12H   thiamine  100 mg Oral Daily   Or   thiamine  100 mg Intravenous Daily   Assessment/Plan: **Encephalopathy: multifactorial, suspect EtOH withdrawal and meds contributing.  Old findings on CT, neurosurgery eval - NTD. Improved.  **Severe AKI, nonoliguric: Multifactorial. Urinary retention noted on  renal US > 825mL, no hydro on [place]; foley in place. UA not consistent with GN.  UNa 67, FeNa 7.8% c/w post renal.  CK ~3600.  Rec'd volume resuscitation without marked improvement in renal function.   BUN 70s, Cr high 4s now from mid 5s. No current indications for RRT but moderate risk for developing in the next 48-72h.   Off IVF now so we can see if he can maintain po intake; resume IVF PRN. If absolutely necessary he would accept HD.    **Anemia:  Hb 9-10s, no active bleeding.  Transfuse <7.  No indication for ESA currently.    **AGMA: normal lactate, presume due to AKI.  Resolved with bicarb gtt.  Trend off bicarb.   **EtOH misuse:  watching for withdrawal - now off CIWA    **aspiration PNA: on unasyn  **A fib/fl:  BB but no plans for anticoag given risk for falls/noncompliance.  Will follow.   Jannifer Hick MD 11/01/2020, 10:03 AM  [facility] Pager: [phone]

## 2020-11-01 NOTE — Progress Notes (Signed)
  Echocardiogram 2D Echocardiogram has been performed.  Donnarae Rae G Weaver Tweed 11/01/2020, 9:07 AM
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
## 2020-11-01 NOTE — Progress Notes (Signed)
Subjective: Patient is somewhat noncoherent today.  Has constellation of medical issues.  Was consulted by Dr. Marcelino Scot of the orthopedic trauma service.  Patient seen at 9:30 AM today.  Objective: Vital signs in last 24 hours: Temp:  [97.8 F (36.6 C)-98.6 F (37 C)] 97.8 F (36.6 C) (07/17 1100) Pulse Rate:  [71-137] 78 (07/17 1400) Resp:  [10-24] 11 (07/17 1200) BP: (98-162)/(67-101) 140/76 (07/17 1400) SpO2:  [90 %-100 %] 92 % (07/17 1400) Weight:  [66.7 kg] 66.7 kg (07/17 0457)  Intake/Output from previous day: 07/16 0701 - 07/17 0700 In: 2721.3 [P.O.:120; I.V.:2325.5; IV Piggyback:275.8] Out: 1500 [Urine:1500] Intake/Output this shift: Total I/O In: 240 [P.O.:240] Out: 500 [Urine:500]  Recent Labs    10/30/20 2003 10/31/20 0238 10/31/20 0428 10/31/20 0618 11/01/20 0044  HGB 10.2* 10.2* 9.9* 9.5* 10.5*   Recent Labs    10/31/20 0238 10/31/20 0428 10/31/20 0618 11/01/20 0044  WBC 10.2  --   --  12.7*  RBC 2.86*  --   --  2.99*  HCT 29.7*   < > 28.0* 29.9*  PLT 93*  --   --  104*   < > = values in this interval not displayed.   Recent Labs    10/31/20 0238 10/31/20 0428 10/31/20 0618 11/01/20 0044  NA 132*   < > 134* 135  136  K 5.0   < > 4.8 3.8  3.8  CL 100  --   --  94*  95*  CO2 15*  --   --  27  27  BUN 76*  --   --  74*  74*  CREATININE 5.22*  --   --  4.90*  4.82*  GLUCOSE 133*  --   --  173*  164*  CALCIUM 6.9*  --   --  7.6*  7.6*   < > = values in this interval not displayed.   Recent Labs    10/31/20 0502 11/01/20 0044  INR 1.5* 1.2   Physical exam: External fixator is intact to the left lower extremity.  Dr. Marcelino Scot has already evaluated this and the pin sites were benign.  As moderate edema with some ecchymosis.  Neurovascular status is intact distally with good capillary refill distally. Left heel shows some mild redness but no evidence of skin breakdown.  The left heel is on an eggcrate. Right lower extremity exam: He has some  very mild redness on his heel but no evidence of any type of skin breakdown.  He is wearing a protective boot.   Assessment/Plan: Open left tibial pilon fracture status post external fixation Significant multiple medical comorbidities. Plan: We appreciate Dr. Marcelino Scot evaluating the patient and managing his complex left lower extremity fracture/wounds.  Further treatment will be per him.  At this point do not see medical need for skin care nurse to evaluate the heels.  There is no evidence of significant breakdown of the skin.  They are currently being treated and protected well.  This should continue   Erlene Senters 11/01/2020, 2:45 PM
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
## 2020-11-01 NOTE — Consult Note (Signed)
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 to samples taken after fasting for at least 8 hours.  Glucose, capillary     Status: Abnormal   Collection Time: 10/31/20  4:55 PM  Result Value Ref Range   Glucose-Capillary 195 (H) 70 - 99 mg/dL    Comment: Glucose reference range applies only to samples taken after fasting for at least 8 hours.  Glucose, capillary     Status: Abnormal   Collection Time: 10/31/20  7:36 PM  Result Value Ref Range   Glucose-Capillary 177 (H) 70 - 99 mg/dL    Comment: Glucose reference range  applies only to samples taken after fasting for at least 8 hours.  Glucose, capillary     Status: Abnormal   Collection Time: 11/01/20 12:02 AM  Result Value Ref  Range   Glucose-Capillary 198 (H) 70 - 99 mg/dL    Comment: Glucose reference range applies only to samples taken after fasting for at least 8 hours.  Comprehensive metabolic panel     Status: Abnormal   Collection Time: 11/01/20 12:44 AM  Result Value Ref Range   Sodium 135 135 - 145 mmol/L   Potassium 3.8 3.5 - 5.1 mmol/L   Chloride 94 (L) 98 - 111 mmol/L   CO2 27 22 - 32 mmol/L   Glucose, Bld 173 (H) 70 - 99 mg/dL    Comment: Glucose reference range applies only to samples taken after fasting for at least 8 hours.   BUN 74 (H) 8 - 23 mg/dL   Creatinine, Ser 4.90 (H) 0.61 - 1.24 mg/dL   Calcium 7.6 (L) 8.9 - 10.3 mg/dL   Total Protein 5.7 (L) 6.5 - 8.1 g/dL   Albumin 3.1 (L) 3.5 - 5.0 g/dL   AST 247 (H) 15 - 41 U/L   ALT 336 (H) 0 - 44 U/L   Alkaline Phosphatase 79 38 - 126 U/L   Total Bilirubin 1.3 (H) 0.3 - 1.2 mg/dL   GFR, Estimated 12 (L) >60 mL/min    Comment: (NOTE) Calculated using the CKD-EPI Creatinine Equation (2021)    Anion gap 14 5 - 15    Comment: Performed at [facility], [place] [address], [place], [place] [address]  CBC with Differential/Platelet     Status: Abnormal   Collection Time: 11/01/20 12:44 AM  Result Value Ref Range   WBC 12.7 (H) 4.0 - 10.5 K/uL   RBC 2.99 (L) 4.22 - 5.81 MIL/uL   Hemoglobin 10.5 (L) 13.0 - 17.0 g/dL   HCT 29.9 (L) 39.0 - 52.0 %   MCV 100.0 80.0 - 100.0 fL   MCH 35.1 (H) 26.0 - 34.0 pg   MCHC 35.1 30.0 - 36.0 g/dL   RDW 14.5 11.5 - 15.5 %   Platelets 104 (L) 150 - 400 K/uL    Comment: Immature Platelet Fraction may be clinically indicated, consider ordering this additional test [id number] CONSISTENT WITH PREVIOUS RESULT REPEATED TO VERIFY    nRBC 0.2 0.0 - 0.2 %   Neutrophils Relative % 92 %   Neutro Abs 11.8 (H) 1.7 - 7.7 K/uL   Lymphocytes Relative 2 %    Lymphs Abs 0.2 (L) 0.7 - 4.0 K/uL   Monocytes Relative 5 %   Monocytes Absolute 0.6 0.1 - 1.0 K/uL   Eosinophils Relative 0 %   Eosinophils Absolute 0.0 0.0 - 0.5 K/uL   Basophils Relative 0 %   Basophils Absolute 0.0 0.0 - 0.1 K/uL   Immature Granulocytes 1 %   Abs Immature Granulocytes 0.07 0.00 - 0.07 K/uL    Comment: Performed at [facility], [place] [address], [place], [place] [address]  Protime-INR     Status: Abnormal   Collection Time: 11/01/20 12:44 AM  Result Value Ref Range   Prothrombin Time 15.6 (H) 11.4 - 15.2 seconds   INR 1.2 0.8 - 1.2    Comment: (NOTE) INR goal varies based on device and disease states. Performed at [facility], [place] [address], [place],  [address]   Basic metabolic panel     Status: Abnormal   Collection Time: 11/01/20 12:44 AM  Result Value Ref Range   Sodium 136 135 - 145 mmol/L   Potassium 3.8 3.5 - 5.1 mmol/L   Chloride 95 (L) 98 - 111 mmol/L  CO2 27 22 - 32 mmol/L   Glucose, Bld 164 (H) 70 - 99 mg/dL    Comment: Glucose reference range applies only to samples taken after fasting for at least 8 hours.   BUN 74 (H) 8 - 23 mg/dL   Creatinine, Ser 4.82 (H) 0.61 - 1.24 mg/dL   Calcium 7.6 (L) 8.9 - 10.3 mg/dL   GFR, Estimated 12 (L) >60 mL/min    Comment: (NOTE) Calculated using the CKD-EPI Creatinine Equation (2021)    Anion gap 14 5 - 15    Comment: Performed at [facility] [address], [place], [place] [address]  Magnesium     Status: None   Collection Time: 11/01/20 12:44 AM  Result Value Ref Range   Magnesium 2.0 1.7 - 2.4 mg/dL    Comment: Performed at [facility] [address], [place], [place] [address]  Glucose, capillary     Status: None   Collection Time: 11/01/20  4:04 AM  Result Value Ref Range   Glucose-Capillary 81 70 - 99 mg/dL    Comment: Glucose reference range applies only to samples taken after fasting for at least 8 hours.    DG Ankle 2 Views Right  Result Date:  10/30/2020 CLINICAL DATA:  Right ankle ex fix. EXAM: DG C-ARM 1-60 MIN; RIGHT ANKLE - 2 VIEW FLUOROSCOPY TIME:  Fluoroscopy Time:  20 seconds. Number of Acquired Spot Images: 2 COMPARISON:  10/30/2020. FINDINGS: Two C-arm fluoroscopic images were obtained intraoperatively and submitted for post operative interpretation. These images demonstrate external fixation of highly comminuted distal tibial and fibular fractures with fixation hardware only partially imaged. The alignment of the fractures appears improved with improved angulation. There is approximately half shaft width lateral displacement of the fibular fracture and slight (approximately 15%) medial displacement of the tibial fracture. These fractures were better characterized on prior radiographs. Please see the performing provider's procedural report for further detail. IMPRESSION: Intraoperative fluoroscopy, as detailed above. Electronically Signed   By: Margaretha Sheffield MD   On: 10/30/2020 19:20   MR Brain Wo Contrast (neuro protocol)  Result Date: 10/31/2020 CLINICAL DATA:  Neuro deficit, acute, stroke suspected. Encephalopathy. Multifactorial. Suspect alcohol withdrawal. EXAM: MRI HEAD WITHOUT CONTRAST TECHNIQUE: Multiplanar, multiecho pulse sequences of the brain and surrounding structures were obtained without intravenous contrast. COMPARISON:  CT head without contrast 09/30/2020. MR head without contrast 01/18/2012 FINDINGS: Brain: Multifocal areas are present. Of acute nonhemorrhagic infarction mm focus present in the right superior cerebellum. Remote medial right occipital lobe infarcts encephalomalacia again noted. Posterior and lateral to this is a focal area of restricted diffusion. White matter infarct in the posterior left temporal and parietal lobe measures up to 18 mm. Focal cortical infarct is noted in the anterior right frontal lobe. Right parietal subcortical white matter infarct present. T2 and FLAIR signal changes are associated  with each of these lesions. No acute hemorrhage mass lesion present. No other significant white matter changes are present. The ventricles are of normal size. Thin extra-axial collection is again noted on the right with near CSF intensity. No fluid levels are present. The internal auditory canals are within normal limits. Remote lacunar infarcts are present in the inferior cerebellum bilaterally, left greater than right. Vascular: Flow is present in the major intracranial arteries. Skull and upper cervical spine: The craniocervical junction is normal. Upper cervical spine is within normal limits. Marrow signal is unremarkable. Sinuses/Orbits: Mild mucosal thickening is present in the left maxillary sinus. There is  some fluid in the mastoid air cells bilaterally. The paranasal sinuses and mastoid air cells are otherwise clear. The globes and orbits are within normal limits. IMPRESSION: 1. Multifocal acute nonhemorrhagic infarcts. Probable central source. 2. 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 3. Acute nonhemorrhagic infarction involving the right superior cerebellum. 4. Acute/subacute nonhemorrhagic infarct involving the posterior left temporal and parietal lobe measuring up to 18 mm. 5. Additional acute nonhemorrhagic infarcts in the anterior right frontal cortex and high right parietal white matter small focus of acute nonhemorrhagic infarct in the posterior right occipital lobe, adjacent to the area chronic encephalomalacia. 6. Remote medial right occipital lobe infarct. 7. Remote lacunar infarcts of the inferior cerebellum bilaterally, left greater than right. Electronically Signed   By: San Morelle M.D.   On: 10/31/2020 16:15   US RENAL  Result Date: 10/30/2020 CLINICAL DATA:  Acute kidney injury. EXAM: RENAL / URINARY TRACT ULTRASOUND COMPLETE COMPARISON:  None. FINDINGS: Right Kidney: Renal measurements: 11.8 x 5.3 x 5.0 cm = volume: 160.5 mL.  Echogenicity within normal limits. No mass or hydronephrosis visualized. Left Kidney: Renal measurements: 11.4 x 6.8 x 4.9 cm = volume: 198.0 mL. Echogenicity within normal limits. No mass or hydronephrosis visualized. Bladder: Diffuse bladder distension measuring 15.2 x 8.8 by 12.0 cm (volume = 840 cm^3). Other: None. IMPRESSION: 1. Echogenicity within normal limits. No mass or hydronephrosis visualized. 2. Diffuse urinary bladder distension with a volume of approximately 140 cc. Electronically Signed   By: Kerby Moors M.D.   On: 10/30/2020 16:53   CT ANKLE LEFT WO CONTRAST  Result Date: 10/31/2020 CLINICAL DATA:  Open ankle fracture. EXAM: CT OF THE LEFT ANKLE WITHOUT CONTRAST TECHNIQUE: Multidetector CT imaging of the left ankle was performed according to the standard protocol. Multiplanar CT image reconstructions were also generated. COMPARISON:  Left ankle x-rays from yesterday. FINDINGS: Bones/Joint/Cartilage Acute highly comminuted fracture of the distal tibial metadiaphysis without intra-articular extension. The dominant fracture fragments are medially displaced 9 mm. There are multiple impacted small bone fragments. Acute highly comminuted fracture of the distal fibular diaphysis. The dominant fracture fragments are medially displaced up to 5 mm there is oblique longitudinal extension of the fracture line through the anterior tip of the lateral malleolus (series 3, image 119). The ankle mortise is symmetric.  The talar dome is intact. Tiny avulsion fracture of the lateral talar process (series 3, image 131). Acute nondisplaced fractures through the second and third metatarsal bases (series 3, image 166). Tiny avulsion fractures of the dorsal medial first metatarsal base and distal medial plantar aspect of the medial cuneiform (series 9, image 64). Lisfranc alignment is grossly maintained. External fixation screw through the calcaneus. Joint spaces are preserved. No joint effusion. Osteopenia.  Ligaments Ligaments are suboptimally evaluated by CT. Muscles and Tendons Grossly intact. Multiple small bone fragments surround the extensor hallucis longus and extensor digitorum longus tendons. Soft tissue Diffuse soft tissue swelling. Small amount of subcutaneous emphysema in the anterior distal lower leg. No fluid collection or hematoma. No soft tissue mass. IMPRESSION: 1. Acute highly comminuted fractures of the distal tibia and fibula as described above. No involvement of the tibial plafond and. 2. Tiny avulsion fractures of the lateral talar process, first metatarsal base, and medial cuneiform. Acute nondisplaced fractures through the second and third metatarsal bases. 3. Lisfranc alignment is grossly maintained. Electronically Signed   By: Titus Dubin M.D.   On: 10/31/2020 09:50   DG Ankle Left Port  Result Date: 10/30/2020 CLINICAL DATA:  Postop EXAM: PORTABLE LEFT ANKLE - 2 VIEW COMPARISON:  10/30/2020 FINDINGS: Interval placement of external fixation device. Highly comminuted fractures involving the distal tibia and fibula with multiple displaced bone fragments. Gas in the soft tissues redemonstrated. IMPRESSION: Interval placement of external fixation device across highly comminuted distal tibial and fibular fractures Electronically Signed   By: Donavan Foil M.D.   On: 10/30/2020 21:34   DG C-Arm 1-60 Min  Result Date: 10/30/2020 CLINICAL DATA:  Right ankle ex fix. EXAM: DG C-ARM 1-60 MIN; RIGHT ANKLE - 2 VIEW FLUOROSCOPY TIME:  Fluoroscopy Time:  20 seconds. Number of Acquired Spot Images: 2 COMPARISON:  10/30/2020. FINDINGS: Two C-arm fluoroscopic images were obtained intraoperatively and submitted for post operative interpretation. These images demonstrate external fixation of highly comminuted distal tibial and fibular fractures with fixation hardware only partially imaged. The alignment of the fractures appears improved with improved angulation. There is approximately half shaft width  lateral displacement of the fibular fracture and slight (approximately 15%) medial displacement of the tibial fracture. These fractures were better characterized on prior radiographs. Please see the performing provider's procedural report for further detail. IMPRESSION: Intraoperative fluoroscopy, as detailed above. Electronically Signed   By: Margaretha Sheffield MD   On: 10/30/2020 19:20   ECHOCARDIOGRAM COMPLETE  Result Date: 11/01/2020    ECHOCARDIOGRAM REPORT   Patient Name:   THOMA PAULSEN Date of Exam: 11/01/2020 Medical Rec #:  [id number]    Height:       68.0 in Accession #:    [id number]   Weight:       147.0 lb Date of Birth:  01-Oct-1950    BSA:          1.793 m Patient Age:    73 years     BP:           147/78 mmHg Patient Gender: M            HR:           79 bpm. Exam Location:  Inpatient Procedure: 2D Echo, Cardiac Doppler and Color Doppler Indications:    Stroke I63.9  History:        Patient has prior history of Echocardiogram examinations, most                 recent 05/24/2019.  Sonographer:    Tiffany Dance Referring Phys: [id number] RAVI Allensworth  1. The MV leaflets appear thickened. There is at least moderate MR by color doppler, and possibly severe. Interrogation is poor. The MR jet is central, but there appears to also be a posteriorly directed jet in the A2C. If there are clinical concerns for severe MR, would recommend a TEE. Would also obtain blood cultures as he did not have regurgitation on his echo from 05/24/2019 to exclude endocarditis. The mitral valve is grossly normal. Moderate to severe mitral valve regurgitation. No evidence of mitral stenosis.  2. Left ventricular ejection fraction, by estimation, is 60 to 65%. The left ventricle has normal function. The left ventricle has no regional wall motion abnormalities. Left ventricular diastolic parameters were normal.  3. Right ventricular systolic function is normal. The right ventricular size is normal. There is mildly elevated  pulmonary artery systolic pressure. The estimated right ventricular systolic pressure is 95.0 mmHg.  4. Left atrial size was mildly dilated.  5. Aortic regurgitation appears unchanged. The aortic valve is grossly normal. Aortic valve regurgitation is moderate. No aortic stenosis is present.  Comparison(s): Changes from prior study are noted. Moderate to severe MR is present. Moderate AI but unchanged. LV/RV function remain normal. FINDINGS  Left Ventricle: Left ventricular ejection fraction, by estimation, is 60 to 65%. The left ventricle has normal function. The left ventricle has no regional wall motion abnormalities. The left ventricular internal cavity size was normal in size. There is  no left ventricular hypertrophy. Left ventricular diastolic parameters were normal. Right Ventricle: The right ventricular size is normal. No increase in right ventricular wall thickness. Right ventricular systolic function is normal. There is mildly elevated pulmonary artery systolic pressure. The tricuspid regurgitant velocity is 2.96  m/s, and with an assumed right atrial pressure of 3 mmHg, the estimated right ventricular systolic pressure is 63.7 mmHg. Left Atrium: Left atrial size was mildly dilated. Right Atrium: Right atrial size was normal in size. Pericardium: Trivial pericardial effusion is present. Mitral Valve: The MV leaflets appear thickened. There is at least moderate MR by color doppler, and possibly severe. Interrogation is poor. The MR jet is central, but there appears to also be a posteriorly directed jet in the A2C. If there are clinical concerns for severe MR, would recommend a TEE. Would also obtain blood cultures as he did not have regurgitation on his echo from 05/24/2019 to exclude endocarditis. The mitral valve is grossly normal. Moderate to severe mitral valve regurgitation. No evidence of mitral valve stenosis. Tricuspid Valve: The tricuspid valve is grossly normal. Tricuspid valve regurgitation is mild  . No evidence of tricuspid stenosis. Aortic Valve: Aortic regurgitation appears unchanged. The aortic valve is grossly normal. Aortic valve regurgitation is moderate. Aortic regurgitation PHT measures 475 msec. No aortic stenosis is present. Pulmonic Valve: The pulmonic valve was grossly normal. Pulmonic valve regurgitation is not visualized. No evidence of pulmonic stenosis. Aorta: The aortic root and ascending aorta are structurally normal, with no evidence of dilitation. IAS/Shunts: The atrial septum is grossly normal.  LEFT VENTRICLE PLAX 2D LVIDd:         4.80 cm  Diastology LVIDs:         3.00 cm  LV e' medial:    8.39 cm/s LV PW:         0.90 cm  LV E/e' medial:  13.3 LV IVS:        1.00 cm  LV e' lateral:   12.70 cm/s LVOT diam:     1.90 cm  LV E/e' lateral: 8.8 LV SV:         55 LV SV Index:   31 LVOT Area:     2.84 cm  RIGHT VENTRICLE             IVC RV Basal diam:  3.20 cm     IVC diam: 1.70 cm RV Mid diam:    2.80 cm RV S prime:     11.40 cm/s TAPSE (M-mode): 1.8 cm LEFT ATRIUM             Index       RIGHT ATRIUM           Index LA diam:        3.90 cm 2.17 cm/m  RA Area:     14.00 cm LA Vol (A2C):   56.1 ml 31.28 ml/m RA Volume:   31.40 ml  17.51 ml/m LA Vol (A4C):   61.2 ml 34.13 ml/m LA Biplane Vol: 60.3 ml 33.62 ml/m  AORTIC VALVE LVOT Vmax:   97.90 cm/s LVOT Vmean:  66.300 cm/s LVOT VTI:  0.193 m AI PHT:      475 msec  AORTA Ao Root diam: 3.50 cm Ao Asc diam:  3.50 cm MITRAL VALVE                 TRICUSPID VALVE MV Area (PHT): 3.21 cm      TR Peak grad:   35.0 mmHg MV Decel Time: 236 msec      TR Vmax:        296.00 cm/s MR Peak grad:    133.2 mmHg MR Mean grad:    89.0 mmHg   SHUNTS MR Vmax:         577.00 cm/s Systemic VTI:  0.19 m MR Vmean:        448.0 cm/s  Systemic Diam: 1.90 cm MR PISA:         1.01 cm MR PISA Eff ROA: 7 mm MR PISA Radius:  0.40 cm MV E velocity: 112.00 cm/s MV A velocity: 94.50 cm/s MV E/A ratio:  1.19 Eleonore Chiquito MD Electronically signed by Eleonore Chiquito MD  Signature Date/Time: 11/01/2020/10:25:29 AM    Final     ROS Unable to reliably obtain; extensive as noted above. Blood pressure (!) 147/78, pulse (!) 136, temperature 97.9 F (36.6 C), resp. rate (!) 23, height 5\' 8"  (1.727 m), weight 66.7 kg, SpO2 93 %. Physical Exam Somnolent but arousable. Limited speaking but does follow commands. LUEx shoulder, elbow, wrist, digits- no skin wounds, nontender, no instability, no blocks to motion  Sens  Ax/R/M/U intact  Mot   Ax/ R/ PIN/ M/ AIN/ U intact  Rad 2+ RUEx shoulder, elbow, wrist, digits- no skin wounds, nontender, no instability, no blocks to motion  Sens  Ax/R/M/U intact  Mot   Ax/ R/ PIN/ M/ AIN/ U intact  Rad 2+ LLE Dressing was removed and new one applied with gentle compression No active drainage currently, prior bleeding from open wounds; no erythema Edema/ swelling moderately severe; diffusely ecchymotic  Sens: DPN, SPN, TN intact  Motor: EHL, FHL, and lessor toe ext and flex all intact grossly  Brisk cap refill, warm to touch, DP not palpable but is dopplerable; gross appearance of chronic mild ischemia RLE No traumatic wounds, ecchymosis, or rash  Nontender  No knee or ankle effusion  Sens DPN, SPN, TN intact  Motor EHL, ext, flex, evers grossly intact  DP palp, No significant edema, gross appearance of chronic mild ischemia    Assessment/Plan: Open left tibial pilon fracture s/p ex fix with large cavitary defect and some residual shortening Acute toxic metabolic encephalopathy 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 Acute kidney injury from rhabdomyolysis Alcohol abuse with possible withdrawal and assoc malnutrition Atrial fibrillation/ flutter  Given the comorbidities and active medical issues, we will likely pursue definitive treatment in this external fixator. We could adjust in the OR and add pins to the forefoot to better control the leg for long term use but is not absolutely  necessary and would not require reversal of anticoagulation if initiated and felt compelling. Will follow with primary service and adjust plan accordingly.   [place] , MD [facility], [facility] [phone]  11/01/2020  10:30 AM

## 2020-11-01 NOTE — Progress Notes (Signed)
STROKE TEAM PROGRESS NOTE   INTERVAL HISTORY His RN is at the bedside.  Patient is awake and interactive.  He is disoriented but follows simple commands.  Left leg has external fixation for his ankle fracture surgery.  Vital signs are stable.  He has been transferred out of the ICU as per CCM team  Vitals:   11/01/20 0800 11/01/20 0900 11/01/20 1000 11/01/20 1100  BP: (!) 147/78 136/74 (!) 143/86 134/83  Pulse: (!) 136 78 79 71  Resp: (!) 23 12 19 14   Temp:    97.8 F (36.6 C)  TempSrc:      SpO2: 93% 90% 100% 95%  Weight:      Height:       CBC:  Recent Labs  Lab 10/31/20 0238 10/31/20 0428 10/31/20 0618 11/01/20 0044  WBC 10.2  --   --  12.7*  NEUTROABS 9.8*  --   --  11.8*  HGB 10.2*   < > 9.5* 10.5*  HCT 29.7*   < > 28.0* 29.9*  MCV 103.8*  --   --  100.0  PLT 93*  --   --  104*   < > = values in this interval not displayed.   Basic Metabolic Panel:  Recent Labs  Lab 10/30/20 2201 10/31/20 0238 10/31/20 0428 10/31/20 0618 11/01/20 0044  NA 134* 132*   < > 134* 135  136  K 4.8 5.0   < > 4.8 3.8  3.8  CL 100 100  --   --  94*  95*  CO2 15* 15*  --   --  27  27  GLUCOSE 87 133*  --   --  173*  164*  BUN 78* 76*  --   --  74*  74*  CREATININE 5.34* 5.22*  --   --  4.90*  4.82*  CALCIUM 7.0* 6.9*  --   --  7.6*  7.6*  MG 2.4  --   --   --  2.0  PHOS 7.5*  --   --   --   --    < > = values in this interval not displayed.   Lipid Panel: No results for input(s): CHOL, TRIG, HDL, CHOLHDL, VLDL, LDLCALC in the last 168 hours. HgbA1c: No results for input(s): HGBA1C in the last 168 hours. Urine Drug Screen:  Recent Labs  Lab 10/30/20 2306  LABOPIA POSITIVE*  COCAINSCRNUR NONE DETECTED  LABBENZ NONE DETECTED  AMPHETMU NONE DETECTED  THCU POSITIVE*  LABBARB NONE DETECTED    Alcohol Level  Recent Labs  Lab 10/30/20 0612  ETH <10    IMAGING past 24 hours MR Brain Wo Contrast (neuro protocol)  Result Date: 10/31/2020 IMPRESSION:  1. Multifocal  acute nonhemorrhagic infarcts. Probable central source.  2. 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  3. Acute nonhemorrhagic infarction involving the right superior cerebellum.  4. Acute/subacute nonhemorrhagic infarct involving the posterior left temporal and parietal lobe measuring up to 18 mm.  5. Additional acute nonhemorrhagic infarcts in the anterior right frontal cortex and high right parietal white matter small focus of acute nonhemorrhagic infarct in the posterior right occipital lobe, adjacent to the area chronic encephalomalacia.  6. Remote medial right occipital lobe infarct.  7. Remote lacunar infarcts of the inferior cerebellum bilaterally, left greater than right.   ECHOCARDIOGRAM COMPLETE  Result Date: 11/01/2020 IMPRESSIONS   1. The MV leaflets appear thickened. There is at least moderate MR by  color doppler, and possibly severe. Interrogation is poor. The MR jet is central, but there appears to also be a posteriorly directed jet in the A2C. If there are clinical concerns for severe MR, would recommend a TEE. Would also obtain blood cultures as he did not have regurgitation on his echo from 05/24/2019 to exclude endocarditis. The mitral valve is grossly normal. Moderate to severe mitral valve regurgitation. No evidence of mitral stenosis.   2. Left ventricular ejection fraction, by estimation, is 60 to 65%. The left ventricle has normal function. The left ventricle has no regional wall motion abnormalities. Left ventricular diastolic parameters were normal.   3. Right ventricular systolic function is normal. The right ventricular size is normal. There is mildly elevated pulmonary artery systolic pressure. The estimated right ventricular systolic pressure is 64.3 mmHg.   4. Left atrial size was mildly dilated.   5. Aortic regurgitation appears unchanged. The aortic valve is grossly normal. Aortic valve regurgitation is moderate. No aortic  stenosis is present.   CT ankle left w/o contrast Result read 10/31/2020 IMPRESSION: 1. Acute highly comminuted fractures of the distal tibia and fibula as described above. No involvement of the tibial plafond and. 2. Tiny avulsion fractures of the lateral talar process, first metatarsal base, and medial cuneiform. Acute nondisplaced fractures through the second and third metatarsal bases. 3. Lisfranc alignment is grossly maintained.  CT head Result read 11/01/2020 IMPRESSION: 1. Age indeterminate small infarcts in the left occipital , right superior cerebellum. No associated hemorrhage or mass effect. 2. Unrelated appearing small 2-3 mm low-density right side subdural collection. This is compatible with age indeterminate subdural hematoma or hygroma (favor the latter). No associated midline shift. 3. Left inferior cerebellar artery territory. 4. Superimposed chronic right PCA and small left PICA territory infarcts. 5. Left scalp soft tissue injuries without underlying skull fracture.  CT C spine IMPRESSION: 1. No definite acute traumatic injury identified in the cervical spine. 2. Chronic very severe cervical spine degeneration, with prior ACDF C5-C6 and C6-C7. Probable pseudoarthrosis at the latter. Severe adjacent segment disease at both C4-C5 and C7-T1, with Mild to Moderate Spinal Stenosis possible at the former. 3. Patchy bilateral ground-glass opacity in the lung apices, remaining suspicious for acute viral/atypical respiratory infection in conjunction with the portable chest today.  Renal ultrasound Result read 10/30/2020 IMPRESSION: 1. Echogenicity within normal limits. No mass or hydronephrosis visualized. 2. Diffuse urinary bladder distension with a volume of approximately 140 cc.  PHYSICAL EXAM Frail elderly Caucasian male not in distress.  He has left ankle external fixation for his fracture and surgery  . Afebrile. Head is nontraumatic. Neck is supple without  bruit.    Cardiac exam no murmur or gallop. Lungs are clear to auscultation. Distal pulses are well felt.  Neurological Exam : Awake alert oriented to time and place.  Diminished attention, registration and recall.  Follows commands well.  No dysarthria or aphasia.  Extraocular movements are full range no nystagmus.  Blinks to threat bilaterally.  Mild left lower facial asymmetry when he smiles.  Tongue midline.  Motor system exam symmetric upper extremity strength bilaterally.  Right lower extremity strength 4/5.  Left lower extremity exam limited due to fracture and external fixation but able to recall the toes to command.  Gait not tested ASSESSMENT/PLAN Austin Andrade is a 70 y.o. male with history of posterior circulation strokes (on Plavix at home), alcohol abuse, left ICA cavernous segment aneurysm, CHF, gout, HLD and HTN who presented to the  hospital after falling down a flight of stairs presenting with multiple left leg/foot fractures and multiple acute strokes in several separate vascular distributions.   Stroke:  Multiple acute strokes in several separate vascular distributions, concerning for possible cardioembolic etiology given his atrial fibrillation, although fat emboli given recent fracture. CT head: small infarcts in the left occipital , right superior cerebellum. Right side subdural collection MRI brain:  acute infarct in right superior cerebellum, anterior right frontal cortex, right parietal white matter, and posterior right occipital lobe. Acute/subacute posterior left temporal and parietal lobe.  Remote medial right occipital lobe infarct. Remote lacunar infarcts of the inferior cerebellum bilaterally, left greater than right.  Carotid Doppler: pending TCDs: pending 2D Echo EF 60-65%, MV leaflets  thickened, moderate/severe MR,  TEE to evaluate severe MR and endocarditis pending LDL 56 HgbA1c 6.5 VTE prophylaxis - SCD Diet: Regular clopidogrel 75 mg daily prior to admission,  now on No antithrombotic. Recommend aspirin for now if not contraindicated from a surgical standpoint.  Will need to switch to anticoagulation given atrial fibrillation when appropriate. 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 No intervention needed at this time  Hypertension Home meds:  none Stable Permissive hypertension (OK if < 220/120) but gradually normalize in 5-7 days Long-term BP goal normotensive  Hyperlipidemia Home meds:  Pravastatin 80mg  daily,  LDL 56, goal < 70 Hold pravastatin for now in the setting of acute liver injury Continue statin at discharge  Atrial Fibrillation New onset Amiodarone infusion Metoprolol 25mg  bid for rate control Holding anticoagulation given risk for falls and possible noncompliance  Alcohol Use Disorder 14.0 standard drinks of alcohol per week Multivitamin, thiamine, folate CIWA protocol Likely causing transaminitis with hyperbilirubinemia  Diabetes type II Controlled Home meds:  none HgbA1c 6.5, goal < 7.0 CBGs Recent Labs    11/01/20 0002 11/01/20 0404 11/01/20 1129  GLUCAP 198* 81 155*    SSI  Other Stroke Risk Factors Advanced Age >/= 35  Cigarette smoker: 40.00 pack-year smoking history. advised to stop smoking ETOH use, alcohol level <10, advised to drink no more than 1 drink a day Substance abuse - UDS:  THC POSITIVE, Cocaine NONE DETECTED. Patient advised to stop using due to stroke risk. Hx stroke: ischemic multifocal posterior circulation stroke in October 2013. Right PCA and left cerebellum infarct. Residual left hemianopsia and balance difficulty. Congestive heart failure: EF 35-40%, with diffuse hypokinesis 01/2012  Other Active Problems Fractures comminuted fractures of the distal tibia and fibula, tiny avulsion fractures of the lateral talar process, first metatarsal base, and medial cuneiform. Acute nondisplaced fractures through the second and  third metatarsal bases. Ortho on board S/p external fixation  Aspiration pneumonia Unasyn  Chronic NeckPain Ultram  Acute Kidney Injury Moderate rhabdomyolysis Nephrology on board Bladder US Renal ultrasound: no hydro Fluid resuscitation  Hospital day # 2  Austin Andrade, ACNP-BC Stroke NP I have personally obtained history,examined this patient, reviewed notes, independently viewed imaging studies, participated in medical decision making and plan of care.ROS completed by me personally and pertinent positives fully documented  I have made any additions or clarifications directly to the above note. Agree with note above.  Patient has by cerebral embolic infarcts in the setting of atrial fibrillation and recent fracture surgery.  Recommend continue ongoing stroke work-up with checking carotid and transcranial Doppler studies.  Continue aspirin for now for stroke prevention but switch to Eliquis for long-term anticoagulations once safe from the surgical standpoint.  Discussed  with patient and Dr. Kipp Brood critical care medicine.  Greater than 50% time during this 35-minute visit were spent on counseling and coordination of care and discussion with care team and answering questions  Antony Contras, MD Medical Director [facility] Pager: [phone] 11/01/2020 3:37 PM  To contact Stroke Continuity provider, please refer to [email or web address] After hours, contact General Neurology

## 2020-11-01 NOTE — Progress Notes (Signed)
VASCULAR LAB    Carotid duplex has been performed.  See CV proc for preliminary results.   Lakeith Careaga, RVT 11/01/2020, 2:49 PM

## 2020-11-01 NOTE — Progress Notes (Signed)
VASCULAR LAB    Attempted TCD.  Patient would not relax head onto pillow. Constantly moving and readjusting pillows and pushing this sonographer's arm away. We will attempt tomorrow, as schedule permits, to see if patient is in a better place to perform testing.    Fulton Merry, RVT 11/01/2020, 2:50 PM

## 2020-11-02 DIAGNOSIS — I639 Cerebral infarction, unspecified: Secondary | ICD-10-CM

## 2020-11-02 DIAGNOSIS — G9340 Encephalopathy, unspecified: Secondary | ICD-10-CM | POA: Diagnosis not present

## 2020-11-02 DIAGNOSIS — I6300 Cerebral infarction due to thrombosis of unspecified precerebral artery: Secondary | ICD-10-CM | POA: Diagnosis not present

## 2020-11-02 DIAGNOSIS — M6282 Rhabdomyolysis: Secondary | ICD-10-CM | POA: Diagnosis not present

## 2020-11-02 DIAGNOSIS — N179 Acute kidney failure, unspecified: Secondary | ICD-10-CM | POA: Diagnosis not present

## 2020-11-02 LAB — COMPREHENSIVE METABOLIC PANEL
ALT: 115 U/L — ABNORMAL HIGH (ref 0–44)
AST: 91 U/L — ABNORMAL HIGH (ref 15–41)
Albumin: 2.9 g/dL — ABNORMAL LOW (ref 3.5–5.0)
Alkaline Phosphatase: 87 U/L (ref 38–126)
Anion gap: 16 — ABNORMAL HIGH (ref 5–15)
BUN: 83 mg/dL — ABNORMAL HIGH (ref 8–23)
CO2: 27 mmol/L (ref 22–32)
Calcium: 8.2 mg/dL — ABNORMAL LOW (ref 8.9–10.3)
Chloride: 95 mmol/L — ABNORMAL LOW (ref 98–111)
Creatinine, Ser: 4.41 mg/dL — ABNORMAL HIGH (ref 0.61–1.24)
GFR, Estimated: 14 mL/min — ABNORMAL LOW (ref >60)
Glucose, Bld: 159 mg/dL — ABNORMAL HIGH (ref 70–99)
Potassium: 3.6 mmol/L (ref 3.5–5.1)
Sodium: 138 mmol/L (ref 135–145)
Total Bilirubin: 1.7 mg/dL — ABNORMAL HIGH (ref 0.3–1.2)
Total Protein: 5.7 g/dL — ABNORMAL LOW (ref 6.5–8.1)

## 2020-11-02 LAB — CBC WITH DIFFERENTIAL/PLATELET
Abs Immature Granulocytes: 0.16 10*3/uL — ABNORMAL HIGH (ref 0.00–0.07)
Basophils Absolute: 0 10*3/uL (ref 0.0–0.1)
Basophils Relative: 0 %
Eosinophils Absolute: 0 10*3/uL (ref 0.0–0.5)
Eosinophils Relative: 0 %
HCT: 29.9 % — ABNORMAL LOW (ref 39.0–52.0)
Hemoglobin: 10.4 g/dL — ABNORMAL LOW (ref 13.0–17.0)
Immature Granulocytes: 1 %
Lymphocytes Relative: 5 %
Lymphs Abs: 0.7 10*3/uL (ref 0.7–4.0)
MCH: 34.9 pg — ABNORMAL HIGH (ref 26.0–34.0)
MCHC: 34.8 g/dL (ref 30.0–36.0)
MCV: 100.3 fL — ABNORMAL HIGH (ref 80.0–100.0)
Monocytes Absolute: 0.9 10*3/uL (ref 0.1–1.0)
Monocytes Relative: 7 %
Neutro Abs: 11.5 10*3/uL — ABNORMAL HIGH (ref 1.7–7.7)
Neutrophils Relative %: 87 %
Platelets: 91 10*3/uL — ABNORMAL LOW (ref 150–400)
RBC: 2.98 MIL/uL — ABNORMAL LOW (ref 4.22–5.81)
RDW: 14.7 % (ref 11.5–15.5)
WBC: 13.3 10*3/uL — ABNORMAL HIGH (ref 4.0–10.5)
nRBC: 0.2 % (ref 0.0–0.2)

## 2020-11-02 LAB — GLUCOSE, CAPILLARY
Glucose-Capillary: 148 mg/dL — ABNORMAL HIGH (ref 70–99)
Glucose-Capillary: 101 mg/dL — ABNORMAL HIGH (ref 70–99)
Glucose-Capillary: 191 mg/dL — ABNORMAL HIGH (ref 70–99)
Glucose-Capillary: 187 mg/dL — ABNORMAL HIGH (ref 70–99)
Glucose-Capillary: 153 mg/dL — ABNORMAL HIGH (ref 70–99)

## 2020-11-02 LAB — PROTIME-INR
INR: 1.3 — ABNORMAL HIGH (ref 0.8–1.2)
Prothrombin Time: 15.7 seconds — ABNORMAL HIGH (ref 11.4–15.2)

## 2020-11-02 LAB — MAGNESIUM: Magnesium: 1.9 mg/dL (ref 1.7–2.4)

## 2020-11-02 MED ORDER — INSULIN ASPART 100 UNIT/ML IJ SOLN
0.0000 [IU] | Freq: Three times a day (TID) | INTRAMUSCULAR | Status: DC
  Administered 2020-11-02 – 2020-11-03 (×2): 2 [IU] via SUBCUTANEOUS
  Administered 2020-11-04 – 2020-11-05 (×2): 1 [IU] via SUBCUTANEOUS
  Administered 2020-11-09 – 2020-11-10 (×2): 3 [IU] via SUBCUTANEOUS
  Administered 2020-11-10: 1 [IU] via SUBCUTANEOUS
  Administered 2020-11-11: 3 [IU] via SUBCUTANEOUS
  Administered 2020-11-11: 1 [IU] via SUBCUTANEOUS

## 2020-11-02 MED ORDER — MAGNESIUM SULFATE 2 GM/50ML IV SOLN
2.0000 g | Freq: Once | INTRAVENOUS | Status: AC
  Administered 2020-11-02: 2 g via INTRAVENOUS
  Filled 2020-11-02: qty 50

## 2020-11-02 NOTE — Consult Note (Signed)
[facility] Nurse Consult Note: Reason for Consult: Consult requested for bilat heels. Left heel has an external fixator and pt is followed by the ortho service. Deep tissue pressure injury revealed underneath ace wrap; 2X2cm dark red-purple Right heel Deep tissue pressure injury 1X1cm dark red-purple Pressure Injury POA: Yes Dressing procedure/placement/frequency: Float heels to reduce pressure.  Pt has a Prevalon boot in place to RLE. Please refer to ortho service for further plan of care for left leg. Please re-consult if further assistance is needed.  Thank-you,  Julien Girt MSN, [place], [place], [place], [place]

## 2020-11-02 NOTE — Progress Notes (Signed)
Subjective: Overnight events: Transferred to IMTS problem ICU.  Patient seen this morning at bedside. He states that he is doing "fine I guess" he continues to endorse a cough this morning but is unable to produce sputum.    Objective:  Vital signs in last 24 hours: Vitals:   11/01/20 1800 11/01/20 1804 11/01/20 1900 11/01/20 2029  BP:  109/73 120/71 (!) 143/75  Pulse: (!) 131 (!) 140 72 84  Resp: 19 (!) 23 14 18   Temp:    98.2 F (36.8 C)  TempSrc:    Oral  SpO2: 95% 96% 97% 99%  Weight:      Height:       Physical Exam Constitutional:      General: He is not in acute distress.    Appearance: He is not toxic-appearing.     Comments: Answers questions appropriately, Ill appearing  Cardiovascular:     Rate and Rhythm: Normal rate and regular rhythm.     Pulses: Normal pulses.  Pulmonary:     Effort: Pulmonary effort is normal.     Breath sounds: Normal breath sounds. No wheezing.  Abdominal:     General: Bowel sounds are normal.     Palpations: Abdomen is soft.     Tenderness: There is no abdominal tenderness. There is no guarding.  Skin:    General: Skin is warm.  Neurological:     Mental Status: He is alert.     Comments: Alert to person, place, and time.  Psychiatric:     Comments: Flat affect.      . Assessment/Plan:  Principal Problem:   ARF (acute renal failure) (HCC) Active Problems:   Cerebral infarction (HCC)   Encephalopathy acute   Tibia/fibula fracture, left, closed, initial encounter   Subdural hematoma (HCC)   Rhabdomyolysis  Acute kidney injury Rhabdomyolysis Anion gap metabolic acidosis: Serum creatinine continues to downtrend from 4.90 to 4.41 today.  He is net -1.67 L overnight.  He does have an increased anion gap of 16 from 14 likely secondary to his acute kidney injury. Required I/O Catheter overnight with output of 800 cc. - Appreciate nephrology's recommendations - Trend CMP - Trend ins and outs  Cerebral infarction: MRI  brain showing acute infarct in the right superior cerebellum, anterior right frontal cortex, right parietal matter, and posterior right occipital lobe.  His echo shows ejection fraction of 60-65%, mitral valve leaflets appear to be thickened, with moderate mitral valve regurg which is new from his echo on 05/24/2019.  Given new findings TEE was recommended as well as collecting blood cultures.  Patient has no prior recordings of IV drug use. His preliminary ultrasound the carotid showed 1 to 39% stenosis of the bilateral carotids. The extent in multiple territories  are concerning for fat emboli in the setting of his l distal tibia-fibula fracture or cardioembolic with new onset mitral valve regurg. -Spoke with cardiology to schedule TEE, earliest available appointment 11/06/2020.  Appreciate their assistance. - Follow-up PT OT evaluation - Holding pravastatin 80 mg secondary to elevated liver entry  Distal left-sided tibia/fibular Fracture: - Appreciate orthopedic's recommendations.  Alcohol Use Disorder: Patient with CIWA scores of 0 overnight with his last positive CIWA being 3 on 10/31/2020.  We will continue CIWA without Ativan at this time as patient is out of the window. - CIWA protocol without Ativan  Community-acquired pneumonia versus aspiration pneumonia: Patient currently on room air from 3 L.  White count 13.3.  On day 4 of 7  of Unasyn. - Continue Unasyn - ICP - Aspiration precautions - Continue Robitussin 5 mL every 4 hours as needed   Atrial Fibrillation/Atrial Flutter: Patient presents with new mitral regurgitation and atrial fibrillation currently on metoprolol 25 twice daily.  Patient continues to have intermittent bouts of atrial fibrillation.  We will check a magnesium today and replete as necessary. - Continue metoprolol 25 mg twice daily - Magnesium  Elevated Liver Enzymes: Liver enzymes continue to downtrend, continuing to hold pravastatin 80 mg daily until the  equalized. - Trend CMP  Diabetes: Patient with a A1c of 6.5 a year ago.  6.4 on repeat during this hospitalization.  Glucose ranges been 100-191 past 24 hours. - Carb modified diet - Continue SSI  Right Subdural Hygroma: Neurosurgery has evaluated, no intervention at this time   Prior to Admission Living Arrangement: Home Anticipated Discharge Location: To be determined Barriers to Discharge: Continue medical work-up Dispo: Anticipated discharge in approximately 4-5 day(s).   Maudie Mercury, MD 11/02/2020, 8:25 AM Pager: [phone] After 5pm on weekdays and 1pm on weekends: On Call pager [phone]

## 2020-11-02 NOTE — Progress Notes (Signed)
[facility] [facility] Progress Note Patient Name: Chayim Bialas DOB: 08-29-1950 MRN: [id number]   Date of Service  11/02/2020  HPI/Events of Note  Urinary Retention - Bladder scan with > 621 mL residual.   eICU Interventions  Plan: I/O Cath PRN.     Intervention Category Major Interventions: Other:  Lysle Dingwall 11/02/2020, 5:29 AM

## 2020-11-02 NOTE — Progress Notes (Signed)
Telemetry called to report pt HR , tachy @ 160, Afib, RVR, attending  has been notified

## 2020-11-02 NOTE — Progress Notes (Signed)
Pt bladder scanned , then cathed, output was 850 cc, pt will likely need a foley

## 2020-11-02 NOTE — Progress Notes (Signed)
STROKE TEAM PROGRESS NOTE   INTERVAL HISTORY Patient is lying in bed.  His neck is rotated to the left.  Is more alert and oriented today.  Left leg has external fixation for his ankle fracture surgery.  Vital signs are stable.  Neurological exam is mostly unchanged.  He remains in atrial fibrillation with a rapid heart rate.  Carotid ultrasound shows no significant extracranial stenosis.  Transcranial Doppler study was attempted but patient not cooperative to have it done. Vitals:   11/01/20 1804 11/01/20 1900 11/01/20 2029 11/02/20 1136  BP: 109/73 120/71 (!) 143/75 (!) 159/82  Pulse: (!) 140 72 84 77  Resp: (!) 23 14 18 15   Temp:   98.2 F (36.8 C) 98.2 F (36.8 C)  TempSrc:   Oral   SpO2: 96% 97% 99% 100%  Weight:      Height:       CBC:  Recent Labs  Lab 11/01/20 0044 11/02/20 0419  WBC 12.7* 13.3*  NEUTROABS 11.8* 11.5*  HGB 10.5* 10.4*  HCT 29.9* 29.9*  MCV 100.0 100.3*  PLT 104* 91*   Basic Metabolic Panel:  Recent Labs  Lab 10/30/20 2201 10/31/20 0238 11/01/20 0044 11/02/20 0419  NA 134*   < > 135  136 138  K 4.8   < > 3.8  3.8 3.6  CL 100   < > 94*  95* 95*  CO2 15*   < > 27  27 27   GLUCOSE 87   < > 173*  164* 159*  BUN 78*   < > 74*  74* 83*  CREATININE 5.34*   < > 4.90*  4.82* 4.41*  CALCIUM 7.0*   < > 7.6*  7.6* 8.2*  MG 2.4  --  2.0 1.9  PHOS 7.5*  --   --   --    < > = values in this interval not displayed.   Lipid Panel:  Recent Labs  Lab 11/01/20 0044  CHOL 129  TRIG 83  HDL 36*  CHOLHDL 3.6  VLDL 17  LDLCALC 76   HgbA1c:  Recent Labs  Lab 11/01/20 0044  HGBA1C 6.4*   Urine Drug Screen:  Recent Labs  Lab 10/30/20 2306  LABOPIA POSITIVE*  COCAINSCRNUR NONE DETECTED  LABBENZ NONE DETECTED  AMPHETMU NONE DETECTED  THCU POSITIVE*  LABBARB NONE DETECTED    Alcohol Level  Recent Labs  Lab 10/30/20 0612  ETH <10    IMAGING past 24 hours MR Brain Wo Contrast (neuro protocol)  Result Date: 10/31/2020 IMPRESSION:   1. Multifocal acute nonhemorrhagic infarcts. Probable central source.  2. 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  3. Acute nonhemorrhagic infarction involving the right superior cerebellum.  4. Acute/subacute nonhemorrhagic infarct involving the posterior left temporal and parietal lobe measuring up to 18 mm.  5. Additional acute nonhemorrhagic infarcts in the anterior right frontal cortex and high right parietal white matter small focus of acute nonhemorrhagic infarct in the posterior right occipital lobe, adjacent to the area chronic encephalomalacia.  6. Remote medial right occipital lobe infarct.  7. Remote lacunar infarcts of the inferior cerebellum bilaterally, left greater than right.   ECHOCARDIOGRAM COMPLETE  Result Date: 11/01/2020 IMPRESSIONS   1. The MV leaflets appear thickened. There is at least moderate MR by color doppler, and possibly severe. Interrogation is poor. The MR jet is central, but there appears to also be a posteriorly directed jet in the A2C. If there are clinical concerns  for severe MR, would recommend a TEE. Would also obtain blood cultures as he did not have regurgitation on his echo from 05/24/2019 to exclude endocarditis. The mitral valve is grossly normal. Moderate to severe mitral valve regurgitation. No evidence of mitral stenosis.   2. Left ventricular ejection fraction, by estimation, is 60 to 65%. The left ventricle has normal function. The left ventricle has no regional wall motion abnormalities. Left ventricular diastolic parameters were normal.   3. Right ventricular systolic function is normal. The right ventricular size is normal. There is mildly elevated pulmonary artery systolic pressure. The estimated right ventricular systolic pressure is 73.5 mmHg.   4. Left atrial size was mildly dilated.   5. Aortic regurgitation appears unchanged. The aortic valve is grossly normal. Aortic valve regurgitation is  moderate. No aortic stenosis is present.   CT ankle left w/o contrast Result read 10/31/2020 IMPRESSION: 1. Acute highly comminuted fractures of the distal tibia and fibula as described above. No involvement of the tibial plafond and. 2. Tiny avulsion fractures of the lateral talar process, first metatarsal base, and medial cuneiform. Acute nondisplaced fractures through the second and third metatarsal bases. 3. Lisfranc alignment is grossly maintained.  CT head Result read 11/01/2020 IMPRESSION: 1. Age indeterminate small infarcts in the left occipital , right superior cerebellum. No associated hemorrhage or mass effect. 2. Unrelated appearing small 2-3 mm low-density right side subdural collection. This is compatible with age indeterminate subdural hematoma or hygroma (favor the latter). No associated midline shift. 3. Left inferior cerebellar artery territory. 4. Superimposed chronic right PCA and small left PICA territory infarcts. 5. Left scalp soft tissue injuries without underlying skull fracture.  CT C spine IMPRESSION: 1. No definite acute traumatic injury identified in the cervical spine. 2. Chronic very severe cervical spine degeneration, with prior ACDF C5-C6 and C6-C7. Probable pseudoarthrosis at the latter. Severe adjacent segment disease at both C4-C5 and C7-T1, with Mild to Moderate Spinal Stenosis possible at the former. 3. Patchy bilateral ground-glass opacity in the lung apices, remaining suspicious for acute viral/atypical respiratory infection in conjunction with the portable chest today.  Renal ultrasound Result read 10/30/2020 IMPRESSION: 1. Echogenicity within normal limits. No mass or hydronephrosis visualized. 2. Diffuse urinary bladder distension with a volume of approximately 140 cc.  PHYSICAL EXAM Frail elderly Caucasian male not in distress.  He has left ankle external fixation for his fracture and surgery.  Neck is rotated to the left  .  Afebrile. Head is nontraumatic. Neck is supple without bruit.    Cardiac exam no murmur or gallop. Lungs are clear to auscultation. Distal pulses are well felt.  Neurological Exam : Awake alert oriented to time and place.  Diminished attention, registration and recall.  Follows commands well.  No dysarthria or aphasia.  Extraocular movements are full range no nystagmus.  Blinks to threat bilaterally.  Mild left lower facial asymmetry when he smiles.  Tongue midline.  Motor system exam symmetric upper extremity strength bilaterally.  Right lower extremity strength 4/5.  Left lower extremity exam limited due to fracture and external fixation but able to recall the toes to command.  Gait not tested  ASSESSMENT/PLAN  Mr. Austin Andrade is a 70 y.o. 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   Stroke:  Multiple acute strokes in several separate vascular distributions, concerning for possible cardioembolic etiology given his atrial fibrillation, although fat emboli given recent fracture. CT head: small infarcts in the left occipital , right superior cerebellum. Right side subdural collection MRI brain:  acute infarct in right superior cerebellum, anterior right frontal cortex, right parietal white matter, and posterior right occipital lobe. Acute/subacute posterior left temporal and parietal lobe.  Remote medial right occipital lobe infarct. Remote lacunar infarcts of the inferior cerebellum bilaterally, left greater than right.  Carotid Doppler: Bilateral 1-39% carotid stenosis.   TCDs: Not done due to patient's lack of cooperation 2D Echo EF 60-65%, MV leaflets  thickened, moderate/severe MR,  TEE to evaluate severe MR and  endocarditis pending LDL 56 HgbA1c 6.5 VTE prophylaxis - SCD Diet: Regular clopidogrel 75 mg daily prior to admission, now on No antithrombotic. Recommend aspirin for now if not contraindicated from a surgical standpoint.  Will need to switch to anticoagulation given atrial fibrillation when appropriate. 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 No intervention needed at this time  Hypertension Home meds:  none Stable Permissive hypertension (OK if < 220/120) but gradually normalize in 5-7 days Long-term BP goal normotensive  Hyperlipidemia Home meds:  Pravastatin 80mg  daily,  LDL 56, goal < 70 Hold pravastatin for now in the setting of acute liver injury Continue statin at discharge  Atrial Fibrillation New onset Amiodarone infusion Metoprolol 25mg  bid for rate control Holding anticoagulation given risk for falls and possible noncompliance  Alcohol Use Disorder 14.0 standard drinks of alcohol per week Multivitamin, thiamine, folate CIWA protocol Likely causing transaminitis with hyperbilirubinemia  Diabetes type II Controlled Home meds:  none HgbA1c 6.5, goal < 7.0 CBGs Recent Labs    11/02/20 0435 11/02/20 0835 11/02/20 1206  GLUCAP 148* 101* 191*    SSI  Other Stroke Risk Factors Advanced Age >/= 71  Cigarette smoker: 40.00 pack-year smoking history. advised to stop smoking ETOH use, alcohol level <10, advised to drink no more than 1 drink a day Substance abuse - UDS:  THC POSITIVE, Cocaine NONE DETECTED. Patient advised to stop using due to stroke risk. Hx stroke: ischemic multifocal posterior circulation stroke in October 2013. Right PCA and left cerebellum infarct. Residual left hemianopsia and balance difficulty. Congestive heart failure: EF 35-40%, with diffuse hypokinesis 01/2012  Other Active Problems Fractures comminuted fractures of the distal tibia and fibula, tiny avulsion fractures  of the lateral talar process, first metatarsal base, and medial cuneiform. Acute nondisplaced fractures through the second and third metatarsal bases. Ortho on board S/p external fixation  Aspiration pneumonia Unasyn  Chronic NeckPain Ultram  Acute Kidney Injury Moderate rhabdomyolysis Nephrology on board Bladder US Renal ultrasound: no hydro Fluid resuscitation  Hospital day # 3   Patient has by cerebral embolic infarcts in the setting of atrial fibrillation and recent fracture surgery.  Recommend continue ongoing stroke work-up with checking carotid and transcranial Doppler studies.  Continue aspirin for now for stroke prevention but switch to Eliquis for long-term anticoagulations once safe from the surgical standpoint.  Discussed with patient and answered questions.  Stroke team will sign off.  Kindly call for questions.   Antony Contras, MD Medical Director [facility] Pager: [phone] 11/02/2020 12:49 PM  To contact Stroke Continuity provider, please refer to [email or web address] After hours, contact General Neurology

## 2020-11-02 NOTE — Progress Notes (Addendum)
Pt c/o nasal congestion on 3L nasal cannula. Humidifier added to oxygen for comfort. Pt also has not had any urine output since foley catheter removal. Bladder scan showed greater than 621. Patient doesn't express the will to urinate or showing any signs of discomfort. MD paged for orders. Will await call back.  8309: New orders received to in and out cath.  Intermittent cath volume 800. Will continue to monitor pt.

## 2020-11-02 NOTE — TOC CAGE-AID Note (Signed)
Transition of Care [facility]) - CAGE-AID Screening   Patient Details  Name: Graysin Luczynski MRN: [id number] Date of Birth: 09-Oct-1950  Transition of Care [facility]) CM/SW Contact:    Sylver Vantassell C Tarpley-Carter, [place] Phone Number: 11/02/2020, 1:36 PM   Clinical Narrative: Pt participated in [place].  Pt did state that he uses alcohol and substance. Pt has no desire at this time of cutting down or quitting.  Pt was offered resources.  Pt stated they did not feel that they were in need of resources at this time.  CSW will attach resources to dc for possible future use.  Lessie Funderburke Tarpley-Carter, MSW, LCSW-A Pronouns:  She/Her/Hers                          [place] Clinical Social WorkerTransitions of Care Cell:  [phone] [email or web address]    CAGE-AID Screening:    Have You Ever Felt You Ought to Cut Down on Your Drinking or Drug Use?: No Have People Annoyed You By [employer] Your Drinking Or Drug Use?: No Have You Felt Bad Or Guilty About Your Drinking Or Drug Use?: No Have You Ever Had a Drink or Used Drugs First Thing In The Morning to Steady Your Nerves or to Get Rid of a Hangover?: Yes CAGE-AID Score: 1  Substance Abuse Education Offered: Yes  Substance abuse interventions: Scientist, clinical (histocompatibility and immunogenetics)

## 2020-11-02 NOTE — Progress Notes (Addendum)
[facility] Resident Progress Note   Subjective:    Overnight, required I/O for urinary retention with 800cc output. This morning,  Patient is resting comfortably in bed; requesting robitussin to help with clearing mucus. Denies any nausea/vomiting. Neither him or nursing can tell me if he is voiding spontaneously   Objective Vitals:   11/01/20 1804 11/01/20 1900 11/01/20 2029 11/02/20 1136  BP: 109/73 120/71 (!) 143/75 (!) 159/82  Pulse: (!) 140 72 84 77  Resp: (!) 23 14 18 15   Temp:   98.2 F (36.8 C) 98.2 F (36.8 C)  TempSrc:   Oral   SpO2: 96% 97% 99% 100%  Weight:      Height:       Physical Exam General: chronically ill appearing elderly male, no acute distress Heart: irregularly irregular, S1 and S2 present Lungs: bibasilar rhonchi; no wheezing, breathing comfortably on room air   Abdomen: soft, nondistended, nontender, +BS Extremities: L ankle in external fixation; RLE warm/dry without edema. Dialysis Access: None  Additional Objective Labs: Basic Metabolic Panel: Recent Labs  Lab 10/30/20 2201 10/31/20 0238 10/31/20 0428 10/31/20 0618 11/01/20 0044 11/02/20 0419  NA 134* 132*   < > 134* 135  136 138  K 4.8 5.0   < > 4.8 3.8  3.8 3.6  CL 100 100  --   --  94*  95* 95*  CO2 15* 15*  --   --  27  27 27   GLUCOSE 87 133*  --   --  173*  164* 159*  BUN 78* 76*  --   --  74*  74* 83*  CREATININE 5.34* 5.22*  --   --  4.90*  4.82* 4.41*  CALCIUM 7.0* 6.9*  --   --  7.6*  7.6* 8.2*  PHOS 7.5*  --   --   --   --   --    < > = values in this interval not displayed.   Liver Function Tests: Recent Labs  Lab 10/31/20 0238 11/01/20 0044 11/02/20 0419  AST 500* 247* 91*  ALT 562* 336* 115*  ALKPHOS 79 79 87  BILITOT 2.3* 1.3* 1.7*  PROT 5.3* 5.7* 5.7*  ALBUMIN 3.0* 3.1* 2.9*   Recent Labs  Lab 10/30/20 1143  LIPASE 26   CBC: Recent Labs  Lab 10/30/20 0612 10/30/20 0650 10/31/20 0238 10/31/20 0428 10/31/20 0618  11/01/20 0044 11/02/20 0419  WBC 12.9*  --  10.2  --   --  12.7* 13.3*  NEUTROABS  --   --  9.8*  --   --  11.8* 11.5*  HGB 11.3*   < > 10.2*   < > 9.5* 10.5* 10.4*  HCT 33.4*   < > 29.7*   < > 28.0* 29.9* 29.9*  MCV 105.4*  --  103.8*  --   --  100.0 100.3*  PLT 122*  --  93*  --   --  104* 91*   < > = values in this interval not displayed.   Blood Culture    Component Value Date/Time   SDES BLOOD LEFT HAND 11/01/2020 1151   SPECREQUEST  11/01/2020 1151    BOTTLES DRAWN AEROBIC ONLY Blood Culture results may not be optimal due to an inadequate volume of blood received in culture bottles   CULT  11/01/2020 1151    NO GROWTH < 24 HOURS Performed at [facility], [place] [address] [place], [place] [address]    REPTSTATUS PENDING 11/01/2020 1151  Cardiac Enzymes: Recent Labs  Lab 10/30/20 0612 10/30/20 1143 10/30/20 2201 10/31/20 0238  CKTOTAL 3,265* 3,634* 3,216* 2,340*   CBG: Recent Labs  Lab 11/01/20 1607 11/01/20 1958 11/01/20 2325 11/02/20 0435 11/02/20 0835  GLUCAP 233* 75 113* 148* 101*   Iron Studies: No results for input(s): IRON, TIBC, TRANSFERRIN, FERRITIN in the last 72 hours. @lablastinr3 @ Studies/Results: MR Brain Wo Contrast (neuro protocol)  Result Date: 10/31/2020 CLINICAL DATA:  Neuro deficit, acute, stroke suspected. Encephalopathy. Multifactorial. Suspect alcohol withdrawal. EXAM: MRI HEAD WITHOUT CONTRAST TECHNIQUE: Multiplanar, multiecho pulse sequences of the brain and surrounding structures were obtained without intravenous contrast. COMPARISON:  CT head without contrast 09/30/2020. MR head without contrast 01/18/2012 FINDINGS: Brain: Multifocal areas are present. Of acute nonhemorrhagic infarction mm focus present in the right superior cerebellum. Remote medial right occipital lobe infarcts encephalomalacia again noted. Posterior and lateral to this is a focal area of restricted diffusion. White matter infarct in the posterior left temporal  and parietal lobe measures up to 18 mm. Focal cortical infarct is noted in the anterior right frontal lobe. Right parietal subcortical white matter infarct present. T2 and FLAIR signal changes are associated with each of these lesions. No acute hemorrhage mass lesion present. No other significant white matter changes are present. The ventricles are of normal size. Thin extra-axial collection is again noted on the right with near CSF intensity. No fluid levels are present. The internal auditory canals are within normal limits. Remote lacunar infarcts are present in the inferior cerebellum bilaterally, left greater than right. Vascular: Flow is present in the major intracranial arteries. Skull and upper cervical spine: The craniocervical junction is normal. Upper cervical spine is within normal limits. Marrow signal is unremarkable. Sinuses/Orbits: Mild mucosal thickening is present in the left maxillary sinus. There is some fluid in the mastoid air cells bilaterally. The paranasal sinuses and mastoid air cells are otherwise clear. The globes and orbits are within normal limits. IMPRESSION: 1. Multifocal acute nonhemorrhagic infarcts. Probable central source. 2. 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 3. Acute nonhemorrhagic infarction involving the right superior cerebellum. 4. Acute/subacute nonhemorrhagic infarct involving the posterior left temporal and parietal lobe measuring up to 18 mm. 5. Additional acute nonhemorrhagic infarcts in the anterior right frontal cortex and high right parietal white matter small focus of acute nonhemorrhagic infarct in the posterior right occipital lobe, adjacent to the area chronic encephalomalacia. 6. Remote medial right occipital lobe infarct. 7. Remote lacunar infarcts of the inferior cerebellum bilaterally, left greater than right. Electronically Signed   By: San Morelle M.D.   On: 10/31/2020 16:15   ECHOCARDIOGRAM  COMPLETE  Result Date: 11/01/2020    ECHOCARDIOGRAM REPORT   Patient Name:   DAYVIAN BLIXT Date of Exam: 11/01/2020 Medical Rec #:  [id number]    Height:       68.0 in Accession #:    [id number]   Weight:       147.0 lb Date of Birth:  December 14, 1950    BSA:          1.793 m Patient Age:    15 years     BP:           147/78 mmHg Patient Gender: M            HR:           79 bpm. Exam Location:  Inpatient Procedure: 2D Echo, Cardiac Doppler and Color Doppler Indications:    Stroke  I63.9  History:        Patient has prior history of Echocardiogram examinations, most                 recent 05/24/2019.  Sonographer:    Tiffany Dance Referring Phys: [id number] RAVI Deshler  1. The MV leaflets appear thickened. There is at least moderate MR by color doppler, and possibly severe. Interrogation is poor. The MR jet is central, but there appears to also be a posteriorly directed jet in the A2C. If there are clinical concerns for severe MR, would recommend a TEE. Would also obtain blood cultures as he did not have regurgitation on his echo from 05/24/2019 to exclude endocarditis. The mitral valve is grossly normal. Moderate to severe mitral valve regurgitation. No evidence of mitral stenosis.  2. Left ventricular ejection fraction, by estimation, is 60 to 65%. The left ventricle has normal function. The left ventricle has no regional wall motion abnormalities. Left ventricular diastolic parameters were normal.  3. Right ventricular systolic function is normal. The right ventricular size is normal. There is mildly elevated pulmonary artery systolic pressure. The estimated right ventricular systolic pressure is 94.7 mmHg.  4. Left atrial size was mildly dilated.  5. Aortic regurgitation appears unchanged. The aortic valve is grossly normal. Aortic valve regurgitation is moderate. No aortic stenosis is present. Comparison(s): Changes from prior study are noted. Moderate to severe MR is present. Moderate AI but unchanged. LV/RV  function remain normal. FINDINGS  Left Ventricle: Left ventricular ejection fraction, by estimation, is 60 to 65%. The left ventricle has normal function. The left ventricle has no regional wall motion abnormalities. The left ventricular internal cavity size was normal in size. There is  no left ventricular hypertrophy. Left ventricular diastolic parameters were normal. Right Ventricle: The right ventricular size is normal. No increase in right ventricular wall thickness. Right ventricular systolic function is normal. There is mildly elevated pulmonary artery systolic pressure. The tricuspid regurgitant velocity is 2.96  m/s, and with an assumed right atrial pressure of 3 mmHg, the estimated right ventricular systolic pressure is 65.4 mmHg. Left Atrium: Left atrial size was mildly dilated. Right Atrium: Right atrial size was normal in size. Pericardium: Trivial pericardial effusion is present. Mitral Valve: The MV leaflets appear thickened. There is at least moderate MR by color doppler, and possibly severe. Interrogation is poor. The MR jet is central, but there appears to also be a posteriorly directed jet in the A2C. If there are clinical concerns for severe MR, would recommend a TEE. Would also obtain blood cultures as he did not have regurgitation on his echo from 05/24/2019 to exclude endocarditis. The mitral valve is grossly normal. Moderate to severe mitral valve regurgitation. No evidence of mitral valve stenosis. Tricuspid Valve: The tricuspid valve is grossly normal. Tricuspid valve regurgitation is mild . No evidence of tricuspid stenosis. Aortic Valve: Aortic regurgitation appears unchanged. The aortic valve is grossly normal. Aortic valve regurgitation is moderate. Aortic regurgitation PHT measures 475 msec. No aortic stenosis is present. Pulmonic Valve: The pulmonic valve was grossly normal. Pulmonic valve regurgitation is not visualized. No evidence of pulmonic stenosis. Aorta: The aortic root and  ascending aorta are structurally normal, with no evidence of dilitation. IAS/Shunts: The atrial septum is grossly normal.  LEFT VENTRICLE PLAX 2D LVIDd:         4.80 cm  Diastology LVIDs:         3.00 cm  LV e' medial:    8.39 cm/s  LV PW:         0.90 cm  LV E/e' medial:  13.3 LV IVS:        1.00 cm  LV e' lateral:   12.70 cm/s LVOT diam:     1.90 cm  LV E/e' lateral: 8.8 LV SV:         55 LV SV Index:   31 LVOT Area:     2.84 cm  RIGHT VENTRICLE             IVC RV Basal diam:  3.20 cm     IVC diam: 1.70 cm RV Mid diam:    2.80 cm RV S prime:     11.40 cm/s TAPSE (M-mode): 1.8 cm LEFT ATRIUM             Index       RIGHT ATRIUM           Index LA diam:        3.90 cm 2.17 cm/m  RA Area:     14.00 cm LA Vol (A2C):   56.1 ml 31.28 ml/m RA Volume:   31.40 ml  17.51 ml/m LA Vol (A4C):   61.2 ml 34.13 ml/m LA Biplane Vol: 60.3 ml 33.62 ml/m  AORTIC VALVE LVOT Vmax:   97.90 cm/s LVOT Vmean:  66.300 cm/s LVOT VTI:    0.193 m AI PHT:      475 msec  AORTA Ao Root diam: 3.50 cm Ao Asc diam:  3.50 cm MITRAL VALVE                 TRICUSPID VALVE MV Area (PHT): 3.21 cm      TR Peak grad:   35.0 mmHg MV Decel Time: 236 msec      TR Vmax:        296.00 cm/s MR Peak grad:    133.2 mmHg MR Mean grad:    89.0 mmHg   SHUNTS MR Vmax:         577.00 cm/s Systemic VTI:  0.19 m MR Vmean:        448.0 cm/s  Systemic Diam: 1.90 cm MR PISA:         1.01 cm MR PISA Eff ROA: 7 mm MR PISA Radius:  0.40 cm MV E velocity: 112.00 cm/s MV A velocity: 94.50 cm/s MV E/A ratio:  1.19 Eleonore Chiquito MD Electronically signed by Eleonore Chiquito MD Signature Date/Time: 11/01/2020/10:25:29 AM    Final    VAS US CAROTID  Result Date: 11/01/2020 Carotid Arterial Duplex Study Patient Name:  DUJUAN STANKOWSKI  Date of Exam:   11/01/2020 Medical Rec #: [id number]     Accession #:    [id number] Date of Birth: 26-Jun-1950     Patient Gender: M Patient Age:   69Y Exam Location:  [facility] Procedure:      VAS US CAROTID Referring Phys: [id number] [place] --------------------------------------------------------------------------------  Indications:       CVA and Altered mental status. Risk Factors:      Hypertension, hyperlipidemia, prior CVA, PAD. Other Factors:     ETOH abuse, Atrial fibrillation, CHF. Limitations        Today's exam was limited due to the patient's inability or                    unwillingness to cooperate and Patient unable to rest head  against pillow. Comparison Study:  Prior carotid done 02/06/2012, indicating 1-39% ICA stenosis,                    is available Performing Technologist: Sharion Dove RVS  Examination Guidelines: A complete evaluation includes B-mode imaging, spectral Doppler, color Doppler, and power Doppler as needed of all accessible portions of each vessel. Bilateral testing is considered an integral part of a complete examination. Limited examinations for reoccurring indications may be performed as noted.  Right Carotid Findings: +----------+--------+--------+--------+------------------+------------------+           PSV cm/sEDV cm/sStenosisPlaque DescriptionComments           +----------+--------+--------+--------+------------------+------------------+ CCA Prox  63      14                                intimal thickening +----------+--------+--------+--------+------------------+------------------+ CCA Distal50      12                                intimal thickening +----------+--------+--------+--------+------------------+------------------+ ICA Prox  128     48      40-59%  heterogenous                         +----------+--------+--------+--------+------------------+------------------+ ICA Mid   94      28                                                   +----------+--------+--------+--------+------------------+------------------+ ICA Distal45      11                                                    +----------+--------+--------+--------+------------------+------------------+ ECA       100     9                                                    +----------+--------+--------+--------+------------------+------------------+ +----------+--------+-------+--------+-------------------+           PSV cm/sEDV cmsDescribeArm Pressure (mmHG) +----------+--------+-------+--------+-------------------+ [id number]                                        +----------+--------+-------+--------+-------------------+ +---------+--------+--+--------+--+ VertebralPSV cm/s63EDV cm/s11 +---------+--------+--+--------+--+  Left Carotid Findings: +----------+--------+--------+--------+------------------+------------------+           PSV cm/sEDV cm/sStenosisPlaque DescriptionComments           +----------+--------+--------+--------+------------------+------------------+ CCA Prox  63      14                                intimal thickening +----------+--------+--------+--------+------------------+------------------+ CCA Distal50      12  intimal thickening +----------+--------+--------+--------+------------------+------------------+ ICA Prox  41      12              heterogenous                         +----------+--------+--------+--------+------------------+------------------+ ICA Distal45      11                                                   +----------+--------+--------+--------+------------------+------------------+ ECA       100     9                                                    +----------+--------+--------+--------+------------------+------------------+ +----------+--------+--------+------------+-------------------+           PSV cm/sEDV cm/sDescribe    Arm Pressure (mmHG) +----------+--------+--------+------------+-------------------+ Subclavian                Not assessed                     +----------+--------+--------+------------+-------------------+ +---------+--------+--+--------+ VertebralPSV cm/s63EDV cm/s +---------+--------+--+--------+   Summary: Right Carotid: Velocities in the right ICA are consistent with a 1-39% stenosis. Left Carotid: Velocities in the left ICA are consistent with a 1-39% stenosis. Vertebrals:  Bilateral vertebral arteries demonstrate antegrade flow. Subclavians: Left subclavian artery was not visualized. Normal flow hemodynamics              were seen in the right subclavian artery. *See table(s) above for measurements and observations.     Preliminary    Medications:  ampicillin-sulbactam (UNASYN) IV 3 g (11/02/20 1497)    Chlorhexidine Gluconate Cloth  6 each Topical Q0600   insulin aspart  1-3 Units Subcutaneous Q4H   metoprolol tartrate  25 mg Oral BID   mupirocin ointment  1 application Nasal BID   sodium chloride flush  3 mL Intravenous Q12H   thiamine  100 mg Oral Daily   Or   thiamine  100 mg Intravenous Daily    Dialysis Orders:  Assessment/Plan: 1. Severe nonoligouric AKI, rhabdomyolysis: Multifactorial in setting of post-renal obstruction and rhabdomyolysis. Initially received IV fluid resuscitation for rhabdomyolysis. Overnight required I/O cath with 800 cc urine noted. sCr improving 4.90>4.41. UOP 2.1L over past 24 hours; does not seem to have voided much this morning. Will have bladder scan. If continuing to retain, would have foley placed. Will continue to trend renal function. No absolute dialysis needs and overall renal function has trended down slowly  2. A.fib/flutter with new mitral  regurgitation: Intermittently has Afib with RVR. He is on metoprolol 25mg  bid. Can consider uptitrating this as needed. Scheduled for TEE on 7/22.  3. CVA: Multiple vascular territories. Work up including Echo significant for new mitral regurgitation. Patient scheduled for TEE on 7/22.  4. Distal left tibia/fibular fracture s/p external fixation:  management per orthopedics 5. CAP vs aspiration pneumonia: Continued on IV Unasyn. Robitussin for cough 6. Transaminitis in setting of alcohol use disorder: LFT's improving; patient remains on CIWA, has not required ativan  7. Diabetes: insulin regimen per primary 8. Right subdural hygroma: No further intervention per neurosurgery recommendations     Sadia Aslam,  MD Internal Medicine, PGY-3 11/02/20 2:39 PM Pager # [phone]  Patient seen and examined, agree with above note with above modifications. Pt is non oliguric but some question of stil urinary retention-  req I and O cath overnight.  Crt slowly trending down over last several days which is good but he otherwise is not making much progress in terms of being able to be discharged.  Will order another bladder scan to rule out obstruction and continue to follow renal function Corliss Parish, MD 11/02/2020

## 2020-11-03 DIAGNOSIS — N179 Acute kidney failure, unspecified: Secondary | ICD-10-CM | POA: Diagnosis not present

## 2020-11-03 DIAGNOSIS — E119 Type 2 diabetes mellitus without complications: Secondary | ICD-10-CM

## 2020-11-03 DIAGNOSIS — I4891 Unspecified atrial fibrillation: Secondary | ICD-10-CM

## 2020-11-03 DIAGNOSIS — I639 Cerebral infarction, unspecified: Secondary | ICD-10-CM | POA: Diagnosis not present

## 2020-11-03 DIAGNOSIS — E876 Hypokalemia: Secondary | ICD-10-CM

## 2020-11-03 DIAGNOSIS — M6282 Rhabdomyolysis: Secondary | ICD-10-CM | POA: Diagnosis not present

## 2020-11-03 DIAGNOSIS — E872 Acidosis: Secondary | ICD-10-CM | POA: Diagnosis not present

## 2020-11-03 LAB — COMPREHENSIVE METABOLIC PANEL
ALT: 52 U/L — ABNORMAL HIGH (ref 0–44)
AST: 49 U/L — ABNORMAL HIGH (ref 15–41)
Albumin: 2.8 g/dL — ABNORMAL LOW (ref 3.5–5.0)
Alkaline Phosphatase: 91 U/L (ref 38–126)
Anion gap: 10 (ref 5–15)
BUN: 68 mg/dL — ABNORMAL HIGH (ref 8–23)
CO2: 29 mmol/L (ref 22–32)
Calcium: 8.5 mg/dL — ABNORMAL LOW (ref 8.9–10.3)
Chloride: 100 mmol/L (ref 98–111)
Creatinine, Ser: 3.17 mg/dL — ABNORMAL HIGH (ref 0.61–1.24)
GFR, Estimated: 20 mL/min — ABNORMAL LOW (ref >60)
Glucose, Bld: 123 mg/dL — ABNORMAL HIGH (ref 70–99)
Potassium: 3.2 mmol/L — ABNORMAL LOW (ref 3.5–5.1)
Sodium: 139 mmol/L (ref 135–145)
Total Bilirubin: 1.7 mg/dL — ABNORMAL HIGH (ref 0.3–1.2)
Total Protein: 5.5 g/dL — ABNORMAL LOW (ref 6.5–8.1)

## 2020-11-03 LAB — CBC WITH DIFFERENTIAL/PLATELET
Abs Immature Granulocytes: 0.11 10*3/uL — ABNORMAL HIGH (ref 0.00–0.07)
Basophils Absolute: 0 10*3/uL (ref 0.0–0.1)
Basophils Relative: 0 %
Eosinophils Absolute: 0.1 10*3/uL (ref 0.0–0.5)
Eosinophils Relative: 1 %
HCT: 30.9 % — ABNORMAL LOW (ref 39.0–52.0)
Hemoglobin: 10.7 g/dL — ABNORMAL LOW (ref 13.0–17.0)
Immature Granulocytes: 1 %
Lymphocytes Relative: 10 %
Lymphs Abs: 1.2 10*3/uL (ref 0.7–4.0)
MCH: 34.7 pg — ABNORMAL HIGH (ref 26.0–34.0)
MCHC: 34.6 g/dL (ref 30.0–36.0)
MCV: 100.3 fL — ABNORMAL HIGH (ref 80.0–100.0)
Monocytes Absolute: 0.6 10*3/uL (ref 0.1–1.0)
Monocytes Relative: 5 %
Neutro Abs: 10.1 10*3/uL — ABNORMAL HIGH (ref 1.7–7.7)
Neutrophils Relative %: 83 %
Platelets: 95 10*3/uL — ABNORMAL LOW (ref 150–400)
RBC: 3.08 MIL/uL — ABNORMAL LOW (ref 4.22–5.81)
RDW: 14.9 % (ref 11.5–15.5)
WBC: 12.1 10*3/uL — ABNORMAL HIGH (ref 4.0–10.5)
nRBC: 0 % (ref 0.0–0.2)

## 2020-11-03 LAB — GLUCOSE, CAPILLARY
Glucose-Capillary: 106 mg/dL — ABNORMAL HIGH (ref 70–99)
Glucose-Capillary: 181 mg/dL — ABNORMAL HIGH (ref 70–99)
Glucose-Capillary: 86 mg/dL (ref 70–99)
Glucose-Capillary: 171 mg/dL — ABNORMAL HIGH (ref 70–99)

## 2020-11-03 LAB — PROTIME-INR
INR: 1.3 — ABNORMAL HIGH (ref 0.8–1.2)
Prothrombin Time: 16 seconds — ABNORMAL HIGH (ref 11.4–15.2)

## 2020-11-03 MED ORDER — ASPIRIN EC 81 MG PO TBEC
81.0000 mg | DELAYED_RELEASE_TABLET | Freq: Every day | ORAL | Status: DC
  Administered 2020-11-03 – 2020-11-10 (×7): 81 mg via ORAL
  Filled 2020-11-03 (×7): qty 1

## 2020-11-03 MED ORDER — SODIUM CHLORIDE 0.9 % IV SOLN
3.0000 g | Freq: Two times a day (BID) | INTRAVENOUS | Status: DC
  Administered 2020-11-03 – 2020-11-05 (×4): 3 g via INTRAVENOUS
  Filled 2020-11-03 (×4): qty 8
  Filled 2020-11-03: qty 3

## 2020-11-03 MED ORDER — POTASSIUM CHLORIDE 20 MEQ PO PACK
40.0000 meq | PACK | Freq: Two times a day (BID) | ORAL | Status: DC
  Administered 2020-11-03 (×2): 40 meq via ORAL
  Filled 2020-11-03 (×2): qty 2

## 2020-11-03 MED ORDER — PHENOL 1.4 % MT LIQD
1.0000 | OROMUCOSAL | Status: DC | PRN
  Administered 2020-11-03 – 2020-11-09 (×2): 1 via OROMUCOSAL
  Filled 2020-11-03 (×2): qty 177

## 2020-11-03 MED ORDER — METOPROLOL TARTRATE 50 MG PO TABS
50.0000 mg | ORAL_TABLET | Freq: Two times a day (BID) | ORAL | Status: DC
  Administered 2020-11-03 – 2020-11-11 (×15): 50 mg via ORAL
  Filled 2020-11-03 (×16): qty 1

## 2020-11-03 MED ORDER — TAMSULOSIN HCL 0.4 MG PO CAPS
0.4000 mg | ORAL_CAPSULE | Freq: Every day | ORAL | Status: DC
  Administered 2020-11-03 – 2020-11-11 (×8): 0.4 mg via ORAL
  Filled 2020-11-03 (×8): qty 1

## 2020-11-03 NOTE — Evaluation (Signed)
Occupational Therapy Evaluation Patient Details Name: Austin Andrade MRN: [id number] DOB: [birth year] Today's Date: 11/03/2020    History of Present Illness 71 y.o. male presenting to ED after falling down a flight of stairs. Patient found to have acute A-fib with RVR, AKI, rhabdo, CAP vs aspiration pneumonia, SDH, hepatocellular injury, metabolic acidosis and acute toxic metabolic encephalopathy. MRI (+) multifocal acute nonhemorrhagic infarcts in R superior cerebellum, posterior L temporal and parietal lobes, anterior R frontal cortex, R parietal white matter, and R occipital lobe. XR (+) open L tibial pilon fx s/p external fixation. PMHx significant for ischemic vascular disease and alcohol use disorder.   Clinical Impression   PTA patient was living with a roommate Jari Pigg) in a 1st floor apartment and was grossly I with ADLs/IADLs without AD. Patient currently functioning below baseline demonstrating observed ADLs including toileting at bed level with Mod A for hygiene/clothing management. Session limited secondary to lethargy with patient keeping eyes closed for most of session despite max cues and changes to the environment. Patient also limited by deficits listed below including pain in cervical neck (does not c/o pain in LLE), decreased balance, and generalized weakness/debility and would benefit from continued acute OT services in prep for safe d/c to next level of care with recommendation for SNF rehab.     Follow Up Recommendations  SNF    Equipment Recommendations  Other (comment) (Defer to next level of care.)    Recommendations for Other Services       Precautions / Restrictions Precautions Precautions: Fall Required Braces or Orthoses: Other Brace Other Brace: LLE external fixation Restrictions Weight Bearing Restrictions: Yes LLE Weight Bearing: Non weight bearing      Mobility Bed Mobility Overal bed mobility: Needs Assistance Bed Mobility: Rolling;Supine to  Sit;Sit to Supine Rolling: Min assist   Supine to sit: Mod assist Sit to supine: Mod assist   General bed mobility comments: Min A for rolling in supine to position bedpan. With Mod A at LLE, patient able to advance toward EOB reporting need for BM upon sitting upright. Mod A at LLE for return to supine.    Transfers                 General transfer comment: Deferred secondary to lethargy.    Balance Overall balance assessment: Needs assistance     Sitting balance - Comments: Patient with request for toileting immediately upon sitting upright prompting return to supine. Unable to assess sitting balance this date.                                   ADL either performed or assessed with clinical judgement   ADL Overall ADL's : Needs assistance/impaired                             Toileting- Clothing Manipulation and Hygiene: Maximal assistance;Bed level Toileting - Clothing Manipulation Details (indicate cue type and reason): Max A for hygiene/clothing management at bedlevel       General ADL Comments: Patient greatly limited by pain in cervical neck (does not report pain in LLE at rest or with activity), generalized weakness/debility, and decreased balance.     Vision         Perception     Praxis      Pertinent Vitals/Pain Pain Assessment: Faces Faces Pain Scale: Hurts whole lot Pain Location:  cervical neck; patient does not c/o pain in LLE at rest or with movement Pain Descriptors / Indicators: Aching;Sore;Grimacing Pain Intervention(s): Limited activity within patient's tolerance;Monitored during session;Repositioned     Hand Dominance Right   Extremity/Trunk Assessment Upper Extremity Assessment Upper Extremity Assessment: Generalized weakness   Lower Extremity Assessment Lower Extremity Assessment: Defer to PT evaluation   Cervical / Trunk Assessment Cervical / Trunk Assessment: Other exceptions Cervical / Trunk  Exceptions: Chin to chest   Communication Communication Communication: No difficulties   Cognition Arousal/Alertness: Lethargic;Suspect due to medications Behavior During Therapy: Flat affect Overall Cognitive Status: Impaired/Different from baseline Area of Impairment: Safety/judgement;Awareness                         Safety/Judgement: Decreased awareness of safety;Decreased awareness of deficits Awareness: Emergent   General Comments: Patient A&Ox4; follows 1-step verbal commands with increased time.   General Comments  HR in 70's throughout session. Session limited secondary to lethargy.    Exercises     Shoulder Instructions      Home Living Family/patient expects to be discharged to:: Private residence Living Arrangements: Non-relatives/Friends (Roommate Rober Ronnald Ramp) Available Help at Discharge: Friend(s);Available PRN/intermittently Type of Home: Apartment Home Access: Stairs to enter Entrance Stairs-Number of Steps: 2 STE Entrance Stairs-Rails: None Home Layout: One level     Bathroom Shower/Tub: Teacher, early years/pre: Standard     Home Equipment: None   Additional Comments: Patient is a questionable historian. Will need to confirm home set-up.      Prior Functioning/Environment Level of Independence: Independent        Comments: Patient reports I with ADLs/IADLs; does not drive        OT Problem List: Decreased strength;Decreased activity tolerance;Impaired balance (sitting and/or standing);Decreased cognition;Decreased knowledge of use of DME or AE;Decreased knowledge of precautions;Pain;Increased edema      OT Treatment/Interventions: Self-care/ADL training;Therapeutic exercise;Energy conservation;DME and/or AE instruction;Therapeutic activities;Cognitive remediation/compensation;Patient/family education;Balance training    OT Goals(Current goals can be found in the care plan section) Acute Rehab OT Goals Patient Stated  Goal: To have BM. OT Goal Formulation: With patient Time For Goal Achievement: 11/17/20 Potential to Achieve Goals: Good ADL Goals Pt Will Perform Grooming: with set-up;sitting Pt Will Perform Upper Body Dressing: with set-up;sitting Pt Will Perform Lower Body Dressing: with min assist;sitting/lateral leans;sit to/from stand Pt Will Transfer to Toilet: with min assist;bedside commode;squat pivot transfer Additional ADL Goal #1: Patient will maintain static sitting balance at  EOB with I in prep for ADLs and functional transfers. Additional ADL Goal #2: Patient will complete sit to stand transfers with Min A, use of RW and adherence to LLE NWB precautions.  OT Frequency: Min 2X/week   Barriers to D/C: Inaccessible home environment;Decreased caregiver support          Co-evaluation              AM-PAC OT "6 Clicks" Daily Activity     Outcome Measure Help from another person eating meals?: A Lot Help from another person taking care of personal grooming?: A Lot Help from another person toileting, which includes using toliet, bedpan, or urinal?: A Lot Help from another person bathing (including washing, rinsing, drying)?: A Lot Help from another person to put on and taking off regular upper body clothing?: A Lot Help from another person to put on and taking off regular lower body clothing?: A Lot 6 Click Score: 12   End of Session Equipment  Utilized During Treatment: Oxygen Nurse Communication: Mobility status;Other (comment) (Patient on bedpan)  Activity Tolerance: Patient limited by lethargy;Patient limited by pain Patient left: in bed;with call bell/phone within reach;with bed alarm set  OT Visit Diagnosis: Unsteadiness on feet (R26.81);Other abnormalities of gait and mobility (R26.89);Muscle weakness (generalized) (M62.81);History of falling (Z91.81);Pain Pain - part of body:  (cervical neck)                Time: 1346-1400 OT Time Calculation (min): 14 min Charges:  OT  General Charges $OT Visit: 1 Visit OT Evaluation $OT Eval Moderate Complexity: 1 Mod  Kalum Minner H. OTR/L Supplemental OT, Department of rehab services [phone]  Cloe Sockwell R H. 11/03/2020, 2:25 PM

## 2020-11-03 NOTE — Progress Notes (Signed)
OT Cancellation Note  Patient Details Name: Austin Andrade MRN: [id number] DOB: Nov 29, 1950   Cancelled Treatment:    Reason Eval/Treat Not Completed: Medical issues which prohibited therapy. OT to hold this a.m. secondary to patient in A-fib w/ RVR and rates sustaining in the 130-140s per [facility]. OT to check back as time allows.   Gloris Manchester OTR/L Supplemental OT, Department of rehab services [phone]  Merritt Kibby R H. 11/03/2020, 8:46 AM

## 2020-11-03 NOTE — Progress Notes (Signed)
PT Cancellation Note  Patient Details Name: Austin Andrade MRN: [id number] DOB: 1950-10-17   Cancelled Treatment:    Reason Eval/Treat Not Completed: Medical issues which prohibited therapy (PT on HOLD secondary to patient in A-fib w/ RVR and rates sustaining in the 130-140s per RN [facility]. Will return as able.)   Alvira Philips 11/03/2020, 1:23 PM Gadge Hermiz M,PT Acute Rehab Services [phone] (pager)

## 2020-11-03 NOTE — Progress Notes (Addendum)
[facility] Resident Progress Note   Subjective:    Overnight, required I/O for urinary retention with 900cc amber urine output. Foley placed.   This morning, [name] is resting comfortably in bed. He appears drowsy but easily arousable to verbal stimuli. He does not have any acute concerns at this time.   Objective Vitals:   11/02/20 2019 11/03/20 0003 11/03/20 0100 11/03/20 0443  BP: (!) 146/81   131/73  Pulse: 74 (!) 140 70 72  Resp: 18   20  Temp: 98.1 F (36.7 C)   98 F (36.7 C)  TempSrc:      SpO2: 99%   99%  Weight:      Height:       Physical Exam General: chronically ill appearing elderly male, no acute distress Heart: irregularly irregular rhythm, regular rate, S1 and S2 present Lungs: diffuse rhonchi; no wheezing, breathing comfortably on room air   Abdomen: soft, nondistended, nontender, +BS Extremities: L ankle in external fixation, dressing with fresh blood noted at lateral aspect of ankle; RLE warm/dry without edema. Dialysis Access: None  Additional Objective Labs: Basic Metabolic Panel: Recent Labs  Lab 10/30/20 2201 10/31/20 0238 11/01/20 0044 11/02/20 0419 11/03/20 0300  NA 134*   < > 135  136 138 139  K 4.8   < > 3.8  3.8 3.6 3.2*  CL 100   < > 94*  95* 95* 100  CO2 15*   < > 27  27 27 29   GLUCOSE 87   < > 173*  164* 159* 123*  BUN 78*   < > 74*  74* 83* 68*  CREATININE 5.34*   < > 4.90*  4.82* 4.41* 3.17*  CALCIUM 7.0*   < > 7.6*  7.6* 8.2* 8.5*  PHOS 7.5*  --   --   --   --    < > = values in this interval not displayed.   Liver Function Tests: Recent Labs  Lab 11/01/20 0044 11/02/20 0419 11/03/20 0300  AST 247* 91* 49*  ALT 336* 115* 52*  ALKPHOS 79 87 91  BILITOT 1.3* 1.7* 1.7*  PROT 5.7* 5.7* 5.5*  ALBUMIN 3.1* 2.9* 2.8*   Recent Labs  Lab 10/30/20 1143  LIPASE 26   CBC: Recent Labs  Lab 10/30/20 0612 10/30/20 0650 10/31/20 0238 10/31/20 0428 11/01/20 0044 11/02/20 0419  11/03/20 0300  WBC 12.9*  --  10.2  --  12.7* 13.3* 12.1*  NEUTROABS  --    < > 9.8*  --  11.8* 11.5* 10.1*  HGB 11.3*   < > 10.2*   < > 10.5* 10.4* 10.7*  HCT 33.4*   < > 29.7*   < > 29.9* 29.9* 30.9*  MCV 105.4*  --  103.8*  --  100.0 100.3* 100.3*  PLT 122*  --  93*  --  104* 91* 95*   < > = values in this interval not displayed.   Blood Culture    Component Value Date/Time   SDES BLOOD LEFT HAND 11/01/2020 1151   SPECREQUEST  11/01/2020 1151    BOTTLES DRAWN AEROBIC ONLY Blood Culture results may not be optimal due to an inadequate volume of blood received in culture bottles   CULT  11/01/2020 1151    NO GROWTH 2 DAYS Performed at [facility], [place] [address], [place],  [address]    REPTSTATUS PENDING 11/01/2020 1151    Cardiac Enzymes: Recent Labs  Lab 10/30/20 0612 10/30/20  [place] 10/30/20 2201 10/31/20 0238  CKTOTAL 3,265* 3,634* 3,216* 2,340*   CBG: Recent Labs  Lab 11/02/20 0835 11/02/20 1206 11/02/20 1719 11/02/20 2019 11/03/20 0824  GLUCAP 101* 191* 187* 153* 106*   Iron Studies: No results for input(s): IRON, TIBC, TRANSFERRIN, FERRITIN in the last 72 hours. @lablastinr3 @ Studies/Results: VAS US CAROTID  Result Date: 11/01/2020 Carotid Arterial Duplex Study Patient Name:  Austin Andrade  Date of Exam:   11/01/2020 Medical Rec #: [id number]     Accession #:    [id number] Date of Birth: 22-Oct-1950     Patient Gender: M Patient Age:   69Y Exam Location:  [facility] Procedure:      VAS US CAROTID Referring Phys: [id number] [place] --------------------------------------------------------------------------------  Indications:       CVA and Altered mental status. Risk Factors:      Hypertension, hyperlipidemia, prior CVA, PAD. Other Factors:     ETOH abuse, Atrial fibrillation, CHF. Limitations        Today's exam was limited due to the patient's inability or                    unwillingness to cooperate and Patient unable to rest head                     against pillow. Comparison Study:  Prior carotid done 02/06/2012, indicating 1-39% ICA stenosis,                    is available Performing Technologist: Sharion Dove RVS  Examination Guidelines: A complete evaluation includes B-mode imaging, spectral Doppler, color Doppler, and power Doppler as needed of all accessible portions of each vessel. Bilateral testing is considered an integral part of a complete examination. Limited examinations for reoccurring indications may be performed as noted.  Right Carotid Findings: +----------+--------+--------+--------+------------------+------------------+           PSV cm/sEDV cm/sStenosisPlaque DescriptionComments           +----------+--------+--------+--------+------------------+------------------+ CCA Prox  63      14                                intimal thickening +----------+--------+--------+--------+------------------+------------------+ CCA Distal50      12                                intimal thickening +----------+--------+--------+--------+------------------+------------------+ ICA Prox  128     48      40-59%  heterogenous                         +----------+--------+--------+--------+------------------+------------------+ ICA Mid   94      28                                                   +----------+--------+--------+--------+------------------+------------------+ ICA Distal45      11                                                   +----------+--------+--------+--------+------------------+------------------+ ECA  100     9                                                    +----------+--------+--------+--------+------------------+------------------+ +----------+--------+-------+--------+-------------------+           PSV cm/sEDV cmsDescribeArm Pressure (mmHG) +----------+--------+-------+--------+-------------------+ [id number]                                         +----------+--------+-------+--------+-------------------+ +---------+--------+--+--------+--+ VertebralPSV cm/s63EDV cm/s11 +---------+--------+--+--------+--+  Left Carotid Findings: +----------+--------+--------+--------+------------------+------------------+           PSV cm/sEDV cm/sStenosisPlaque DescriptionComments           +----------+--------+--------+--------+------------------+------------------+ CCA Prox  63      14                                intimal thickening +----------+--------+--------+--------+------------------+------------------+ CCA Distal50      12                                intimal thickening +----------+--------+--------+--------+------------------+------------------+ ICA Prox  41      12              heterogenous                         +----------+--------+--------+--------+------------------+------------------+ ICA Distal45      11                                                   +----------+--------+--------+--------+------------------+------------------+ ECA       100     9                                                    +----------+--------+--------+--------+------------------+------------------+ +----------+--------+--------+------------+-------------------+           PSV cm/sEDV cm/sDescribe    Arm Pressure (mmHG) +----------+--------+--------+------------+-------------------+ Subclavian                Not assessed                    +----------+--------+--------+------------+-------------------+ +---------+--------+--+--------+ VertebralPSV cm/s63EDV cm/s +---------+--------+--+--------+   Summary: Right Carotid: Velocities in the right ICA are consistent with a 1-39% stenosis. Left Carotid: Velocities in the left ICA are consistent with a 1-39% stenosis. Vertebrals:  Bilateral vertebral arteries demonstrate antegrade flow. Subclavians: Left subclavian artery was not visualized. Normal flow hemodynamics               were seen in the right subclavian artery. *See table(s) above for measurements and observations.     Preliminary    Medications:  ampicillin-sulbactam (UNASYN) IV 3 g (11/03/20 0521)    Chlorhexidine Gluconate Cloth  6 each Topical Q0600   insulin aspart  0-9 Units Subcutaneous TID WC   metoprolol tartrate  25 mg Oral BID   mupirocin  ointment  1 application Nasal BID   potassium chloride  40 mEq Oral BID   sodium chloride flush  3 mL Intravenous Q12H   tamsulosin  0.4 mg Oral Daily   thiamine  100 mg Oral Daily   Or   thiamine  100 mg Intravenous Daily    Dialysis Orders:  Assessment/Plan: 1. Severe nonoligouric AKI, rhabdomyolysis: Multifactorial in setting of post-renal obstruction and rhabdomyolysis. Initially received IV fluid resuscitation for rhabdomyolysis. Overnight required I/O cath with 900 cc urine noted; Foley placed. sCr improving 4.90>4.41>3.17. UOP 1.8L over past 24 hours; does not seem to have voided much this morning. No urgent need for HD at this time. Will continue to trend renal function. Thankfully renal function is improving and he did not require any dialysis support 2. A.fib/flutter with new mitral  regurgitation: Intermittently has Afib with RVR. He is on metoprolol 25mg  bid. Can consider uptitrating this as needed. Scheduled for TEE on 7/22.  3. CVA: Multiple vascular territories. Work up including Echo significant for new mitral regurgitation. Patient scheduled for TEE on 7/22.  4. Distal left tibia/fibular fracture s/p external fixation: has some bleeding around lateral maleolus; management per orthopedics 5. CAP vs aspiration pneumonia: Continued on IV Unasyn. Robitussin for cough 6. Transaminitis in setting of alcohol use disorder: LFT's improving; patient remains on CIWA, has not required ativan  7. Diabetes: insulin regimen per primary 8. Right subdural hygroma: No further intervention per neurosurgery recommendations  9. Macrocytic anemia: Hb  stable. vitamin B12 elevated, folate nl; cont to monitor     Harvie Heck, MD Internal Medicine, PGY-3 11/03/20 9:30 AM Pager # [phone]  Patient seen and examined, agree with above note with above modifications. He is not very talkative to my interactions-   he had to be cathed again overnight and not just has an indwelling foley and is on flomax.  Suspect foley may need to stay in place at discharge with urology follow up  Regarding kidney function-  fortunately has improved with conservative therapy-  has not required dialysis   Corliss Parish, MD 11/03/2020

## 2020-11-03 NOTE — Progress Notes (Addendum)
Pt currently in afib w/ rvr with rate sustaining in the 130-140s. Pt given prn iv metoprolol with rate unchanged.   0003: Pt HR still sustaining at 140 in afib. Pt also has not spontaneously voided on his own at this point. Scanned pt's bladder. Bladder scan showed 270. Will rescan bladder in a couple hours and I&O cath as needed. Pt given 2nd dose of iv metoprolol. Pt currently in SR at 44. Will continue to monitor pt.  0524: bladder scanned pt again. Bladder scan showed 460. Would have been pt's 3rd I&O catheterization. Pt unhappy with I&O caths. MD  paged. Orders received to place foley. 71f coude catheter placed with 900cc amber malodorous urine returned. Will continue to monitor pt.

## 2020-11-03 NOTE — Progress Notes (Signed)
SLP Cancellation Note  Patient Details Name: Austin Andrade MRN: [id number] DOB: 1951-01-11   Cancelled treatment:       Reason Eval/Treat Not Completed: Other (comment);Fatigue/lethargy limiting ability to participate  Kathleen Lime, MS [place] Office [phone] Pager [phone]   Macario Golds 11/03/2020, 8:29 AM

## 2020-11-03 NOTE — Plan of Care (Signed)
  Problem: Education: Goal: Knowledge of General Education information will improve Description: Including pain rating scale, medication(s)/side effects and non-pharmacologic comfort measures Outcome: Progressing   Problem: Health Behavior/Discharge Planning: Goal: Ability to manage health-related needs will improve Outcome: Progressing   Problem: Clinical Measurements: Goal: Ability to maintain clinical measurements within normal limits will improve Outcome: Progressing Goal: Will remain free from infection Outcome: Progressing Goal: Diagnostic test results will improve Outcome: Progressing Goal: Respiratory complications will improve Outcome: Progressing Goal: Cardiovascular complication will be avoided Outcome: Progressing   Problem: Activity: Goal: Risk for activity intolerance will decrease Outcome: Progressing   Problem: Nutrition: Goal: Adequate nutrition will be maintained Outcome: Progressing   Problem: Coping: Goal: Level of anxiety will decrease Outcome: Progressing   Problem: Elimination: Goal: Will not experience complications related to bowel motility Outcome: Progressing Goal: Will not experience complications related to urinary retention Outcome: Progressing   Problem: Pain Managment: Goal: General experience of comfort will improve Outcome: Progressing   Problem: Safety: Goal: Ability to remain free from injury will improve Outcome: Progressing   Problem: Skin Integrity: Goal: Risk for impaired skin integrity will decrease Outcome: Progressing   Problem: Education: Goal: Knowledge of the prescribed therapeutic regimen will improve Outcome: Progressing   Problem: Activity: Goal: Ability to increase mobility will improve Outcome: Progressing   Problem: Physical Regulation: Goal: Postoperative complications will be avoided or minimized Outcome: Progressing   Problem: Pain Management: Goal: Pain level will decrease with appropriate  interventions Outcome: Progressing   Problem: Skin Integrity: Goal: Will show signs of wound healing Outcome: Progressing   Problem: Education: Goal: Knowledge of disease or condition will improve Outcome: Progressing Goal: Knowledge of secondary prevention will improve Outcome: Progressing Goal: Knowledge of patient specific risk factors addressed and post discharge goals established will improve Outcome: Progressing Goal: Individualized Educational Video(s) Outcome: Progressing   Problem: Health Behavior/Discharge Planning: Goal: Ability to manage health-related needs will improve Outcome: Progressing   Problem: Self-Care: Goal: Verbalization of feelings and concerns over difficulty with self-care will improve Outcome: Progressing Goal: Ability to communicate needs accurately will improve Outcome: Progressing

## 2020-11-03 NOTE — Progress Notes (Signed)
Pharmacy Antibiotic Note  Austin Andrade is a 70 y.o. male on day #5 Unasyn for aspiration pneumonia coverage.  Fall PTA with rhabdo and AKI.  Creatinine is trending down, nephrology following.  Uncertain if voiding spontaneously, has had I and O caths. Flomax added today  Plan:  Adjust Unasyn 3 gm IV q24hr to q12hr  Follow renal function, culture data, clinical progress and antibiotic plans.  Height: 5\' 8"  (172.7 cm) Weight: 66.7 kg (147 lb 0.8 oz) IBW/kg (Calculated) : 68.4  Temp (24hrs), Avg:98.1 F (36.7 C), Min:98 F (36.7 C), Max:98.2 F (36.8 C)  Recent Labs  Lab 10/30/20 0612 10/30/20 0650 10/30/20 1143 10/30/20 1854 10/30/20 2201 10/31/20 0238 11/01/20 0044 11/02/20 0419 11/03/20 0300  WBC 12.9*  --   --   --   --  10.2 12.7* 13.3* 12.1*  CREATININE 5.51*   < > 5.52*   < > 5.34* 5.22* 4.90*  4.82* 4.41* 3.17*  LATICACIDVEN  --   --  1.2  --  1.1 1.0  --   --   --    < > = values in this interval not displayed.    Estimated Creatinine Clearance: 20.5 mL/min (A) (by C-G formula based on SCr of 3.17 mg/dL (H)).    No Known Allergies  Antimicrobials this admission: Unasyn 7/15>> Bactroban 7/16 >>(7/20) Cefazolin 7/15 pre-op and x 1 post-op 7/16 Clindamycin x 1 on 7/15  tDAP given 7/15  Dose adjustments this admission: 7/19: Unasyn q24h > q12h for improved creatinine  Microbiology results: 7/17 blood x 2: no growth x 2 days to date 7/15 urine: negative 7/16 MRSA PCR: negative 7/15 surgical PCR: positive for Staph, negative for MRSA 7/15 COVID and flu: negative  Thank you for allowing pharmacy to be a part of this patient's care.  Arty Baumgartner, [place] 11/03/2020 10:00 AM

## 2020-11-03 NOTE — Progress Notes (Signed)
HD#4 SUBJECTIVE:  Patient Summary: Austin Andrade is a 70 y.o. with multiple comorbidities including ischemic vascular disease, alcohol use disorder, who presented after a fall down a flight of stairs and was found to have multiple injuries. He was admitted for left ankle fracture, acute renal failure, electrolyte abnormalities, rhabdomyolysis, atrial fibrillation, and cerebral infarctions.    Overnight Events: bladder scanned and cathed with 850cc out. Episode of afib with RVR rates 130-140s, given IV metoprolol with unchanged rate.     Interm History: Patient seen and evaluated at bedside. He reports that he feels okay during exam. Denies new pain, no palpitations. Reports that he has been urinating okay.   OBJECTIVE:  Vital Signs: Vitals:   11/02/20 2019 11/03/20 0003 11/03/20 0100 11/03/20 0443  BP: (!) 146/81   131/73  Pulse: 74 (!) 140 70 72  Resp: 18   20  Temp: 98.1 F (36.7 C)   98 F (36.7 C)  TempSrc:      SpO2: 99%   (!) 88%  Weight:      Height:       Supplemental O2: HFNC SpO2: (!) 88 % O2 Flow Rate (L/min): 3 L/min  Filed Weights   10/30/20 0612 10/31/20 0500 11/01/20 0457  Weight: 56.7 kg 67.8 kg 66.7 kg     Intake/Output Summary (Last 24 hours) at 11/03/2020 0520 Last data filed at 11/02/2020 1800 Gross per 24 hour  Intake 100 ml  Output 2470 ml  Net -2370 ml   Net IO Since Admission: 1,615.47 mL [11/03/20 0520]  Physical Exam: Physical Exam HENT:     Head: Atraumatic.     Mouth/Throat:     Mouth: Mucous membranes are moist.     Comments: large white cratered lesion on the left side of his tongue Cardiovascular:     Rate and Rhythm: Normal rate and regular rhythm.  Abdominal:     Palpations: Abdomen is soft.     Tenderness: There is no abdominal tenderness.  Musculoskeletal:     Cervical back: Neck supple.  Skin:    General: Skin is dry.     Capillary Refill: Capillary refill takes more than 3 seconds.  Neurological:     Mental Status:  He is alert.     Motor: Weakness present.    Patient Lines/Drains/Airways Status     Active Line/Drains/Airways     Name Placement date Placement time Site Days   Peripheral IV 10/30/20 18 G Left Antecubital 10/30/20  0608  Antecubital  4   Peripheral IV 10/30/20 18 G Left;Posterior Hand 10/30/20  1800  Hand  4   External Urinary Catheter 11/01/20  2039  --  2   Incision (Closed) 10/30/20 Ankle Left 10/30/20  1700  -- 4   Pressure Injury 10/31/20 Heel Left Deep Tissue Pressure Injury - Purple or maroon localized area of discolored intact skin or blood-filled blister due to damage of underlying soft tissue from pressure and/or shear. 10/31/20  0430  -- 3   Pressure Injury 10/31/20 Heel Right Deep Tissue Pressure Injury - Purple or maroon localized area of discolored intact skin or blood-filled blister due to damage of underlying soft tissue from pressure and/or shear. 10/31/20  0430  -- 3   EXTERNAL FIXATOR 10/30/20  1010  Lower Leg  4            Pertinent Labs: CBC Latest Ref Rng & Units 11/03/2020 11/02/2020 11/01/2020  WBC 4.0 - 10.5 K/uL 12.1(H) 13.3(H) 12.7(H)  Hemoglobin  13.0 - 17.0 g/dL 10.7(L) 10.4(L) 10.5(L)  Hematocrit 39.0 - 52.0 % 30.9(L) 29.9(L) 29.9(L)  Platelets 150 - 400 K/uL 95(L) 91(L) 104(L)    CMP Latest Ref Rng & Units 11/03/2020 11/02/2020 11/01/2020  Glucose 70 - 99 mg/dL 123(H) 159(H) 173(H)  BUN 8 - 23 mg/dL 68(H) 83(H) 74(H)  Creatinine 0.61 - 1.24 mg/dL 3.17(H) 4.41(H) 4.90(H)  Sodium 135 - 145 mmol/L 139 138 135  Potassium 3.5 - 5.1 mmol/L 3.2(L) 3.6 3.8  Chloride 98 - 111 mmol/L 100 95(L) 94(L)  CO2 22 - 32 mmol/L 29 27 27   Calcium 8.9 - 10.3 mg/dL 8.5(L) 8.2(L) 7.6(L)  Total Protein 6.5 - 8.1 g/dL 5.5(L) 5.7(L) 5.7(L)  Total Bilirubin 0.3 - 1.2 mg/dL 1.7(H) 1.7(H) 1.3(H)  Alkaline Phos 38 - 126 U/L 91 87 79  AST 15 - 41 U/L 49(H) 91(H) 247(H)  ALT 0 - 44 U/L 52(H) 115(H) 336(H)    Recent Labs    11/02/20 1206 11/02/20 1719 11/02/20 2019   GLUCAP 191* 187* 153*     Pertinent Imaging: No results found.  ASSESSMENT/PLAN:  Assessment: Principal Problem:   ARF (acute renal failure) (HCC) Active Problems:   Cerebral infarction (HCC)   Encephalopathy acute   Tibia/fibula fracture, left, closed, initial encounter   Subdural hematoma ([place])   Rhabdomyolysis   Austin Andrade is a 70 y.o. with multiple comorbidities including ischemic vascular disease, alcohol use disorder, who presented after a fall down a flight of stairs and was found to have multiple injuries. He was admitted for left ankle fracture, acute renal failure, electrolyte abnormalities, rhabdomyolysis, atrial fibrillation, and cerebral infarctions on hospital day 4  Plan: Acute kidney injury Rhabdomyolysis Anion gap metabolic acidosis: Multifactorial in setting of post-renal obstruction and rhabdomyolysis. Serum creatinine continues to downtrend, improved to 3.17 today.GFR improved from 12 to 20.  He is net -1.77 L overnight.  Anion gap improved to 10. Bladder scanned and cathed with 850cc out. Improving slowly. - Appreciate nephrology's recommendations - Trend CMP - Trend ins and outs  Cerebral infarction: MRI brain showing acute infarct in the right superior cerebellum, anterior right frontal cortex, right parietal matter, and posterior right occipital lobe.  The extent in multiple territories  are concerning for fat emboli in the setting of his l distal tibia-fibula fracture or cardioembolic with new onset mitral valve regurg. His echo shows ejection fraction of 60-65%, mitral valve leaflets appear to be thickened, with moderate mitral valve regurg which is new from his echo on 05/24/2019. Blood cultures have been negative 24 hrs.  -Spoke with cardiology to schedule TEE, earliest available appointment 11/06/2020.  Appreciate their assistance. - Holding pravastatin 80 mg secondary to elevated liver enzymes - PT, OT, SLP saw patient, however patient unable to  participate in exam.   Atrial Fibrillation/Atrial Flutter: likely old afib?  CHADSVASC 6, HASBLED 6. Patient presents with new mitral regurgitation and atrial fibrillation currently on metoprolol 25 twice daily. Patient has denied workup for suspected afib in the past. Patient continues to have intermittent bouts of atrial fibrillation.  Magnesium wnl. - Continue metoprolol 25 mg twice daily - Magnesium - okay to start ASA per surgery  Community-acquired pneumonia versus aspiration pneumonia: Patient currently on room air from 3 L.  White count improving 12.1.  On day 5 of 7 of Unasyn. - Continue Unasyn - Aspiration precautions - Continue Robitussin 5 mL every 4 hours as needed  Distal left-sided tibia/fibular Fracture: - Appreciate orthopedic's recommendations. - Will work with PT  #  Hypokalemia 3.2 - replete  Alcohol Use Disorder: Patient with CIWA scores of 0 overnight with his last positive CIWA being 3 on 10/31/2020.  We will continue CIWA without Ativan at this time as patient is out of the window. - CIWA protocol without Ativan  Elevated Liver Enzymes: Liver enzymes continue to downtrend, continuing to hold pravastatin 80 mg daily until the equalized. - Trend CMP   Diabetes:  Patient with a A1c of 6.5 a year ago.  6.4 on repeat during this hospitalization.  Glucose ranges been 100-191 past 24 hours. - Carb modified diet - Continue SSI   Right Subdural Hygroma: Neurosurgery has evaluated, no intervention at this time  # Tongue lesion Patient has a large white cratered lesion on the left side of his tongue. Does not appear to be bite wound. Patient will need to be seen by ENT as an outpatient for biopsy.  Best Practice: Diet: Diabetic diet IVF: Fluids: none, Rate: none VTE: SCDs Start: 10/30/20 2154 Code: Full AB: Unasyn   Signature: Delene Ruffini, MD  Internal Medicine Resident, PGY-1 [facility]  Pager: [phone] 5:20 AM,  11/03/2020   Please contact the on call pager after 5 pm and on weekends at [phone].

## 2020-11-03 NOTE — Consult Note (Addendum)
[facility] Nurse Consult Note:  Consult requested for "a large white craterous lesion on the patient's tongue." Secure chat message sent to the primary team: This location is beyond the scope of practice for the wound care team; please contact ENT for further  plan of care.   Please re-consult if further assistance is needed.  Thank-you,  Julien Girt MSN, [place], [place], [place], [place]

## 2020-11-03 NOTE — Care Management Important Message (Signed)
Important Message  Patient Details  Name: Austin Andrade MRN: [id number] Date of Birth: [birth year]   Medicare Important Message Given:  Yes     [name] [name] 11/03/2020, 2:49 PM

## 2020-11-04 ENCOUNTER — Inpatient Hospital Stay (HOSPITAL_COMMUNITY): Payer: Medicare Other

## 2020-11-04 DIAGNOSIS — I639 Cerebral infarction, unspecified: Secondary | ICD-10-CM

## 2020-11-04 DIAGNOSIS — M6282 Rhabdomyolysis: Secondary | ICD-10-CM | POA: Diagnosis not present

## 2020-11-04 DIAGNOSIS — N179 Acute kidney failure, unspecified: Secondary | ICD-10-CM | POA: Diagnosis not present

## 2020-11-04 DIAGNOSIS — G9340 Encephalopathy, unspecified: Secondary | ICD-10-CM | POA: Diagnosis not present

## 2020-11-04 LAB — CBC WITH DIFFERENTIAL/PLATELET
Abs Immature Granulocytes: 0.12 10*3/uL — ABNORMAL HIGH (ref 0.00–0.07)
Basophils Absolute: 0 10*3/uL (ref 0.0–0.1)
Basophils Relative: 0 %
Eosinophils Absolute: 0.1 10*3/uL (ref 0.0–0.5)
Eosinophils Relative: 1 %
HCT: 29 % — ABNORMAL LOW (ref 39.0–52.0)
Hemoglobin: 10.1 g/dL — ABNORMAL LOW (ref 13.0–17.0)
Immature Granulocytes: 1 %
Lymphocytes Relative: 9 %
Lymphs Abs: 1.2 10*3/uL (ref 0.7–4.0)
MCH: 35.6 pg — ABNORMAL HIGH (ref 26.0–34.0)
MCHC: 34.8 g/dL (ref 30.0–36.0)
MCV: 102.1 fL — ABNORMAL HIGH (ref 80.0–100.0)
Monocytes Absolute: 0.4 10*3/uL (ref 0.1–1.0)
Monocytes Relative: 4 %
Neutro Abs: 10.6 10*3/uL — ABNORMAL HIGH (ref 1.7–7.7)
Neutrophils Relative %: 85 %
Platelets: 94 10*3/uL — ABNORMAL LOW (ref 150–400)
RBC: 2.84 MIL/uL — ABNORMAL LOW (ref 4.22–5.81)
RDW: 15.1 % (ref 11.5–15.5)
WBC: 12.5 10*3/uL — ABNORMAL HIGH (ref 4.0–10.5)
nRBC: 0 % (ref 0.0–0.2)

## 2020-11-04 LAB — COMPREHENSIVE METABOLIC PANEL
ALT: 30 U/L (ref 0–44)
AST: 36 U/L (ref 15–41)
Albumin: 2.5 g/dL — ABNORMAL LOW (ref 3.5–5.0)
Alkaline Phosphatase: 86 U/L (ref 38–126)
Anion gap: 8 (ref 5–15)
BUN: 55 mg/dL — ABNORMAL HIGH (ref 8–23)
CO2: 28 mmol/L (ref 22–32)
Calcium: 8.3 mg/dL — ABNORMAL LOW (ref 8.9–10.3)
Chloride: 104 mmol/L (ref 98–111)
Creatinine, Ser: 2.39 mg/dL — ABNORMAL HIGH (ref 0.61–1.24)
GFR, Estimated: 28 mL/min — ABNORMAL LOW (ref >60)
Glucose, Bld: 135 mg/dL — ABNORMAL HIGH (ref 70–99)
Potassium: 4.1 mmol/L (ref 3.5–5.1)
Sodium: 140 mmol/L (ref 135–145)
Total Bilirubin: 1.6 mg/dL — ABNORMAL HIGH (ref 0.3–1.2)
Total Protein: 5.1 g/dL — ABNORMAL LOW (ref 6.5–8.1)

## 2020-11-04 LAB — GLUCOSE, CAPILLARY
Glucose-Capillary: 168 mg/dL — ABNORMAL HIGH (ref 70–99)
Glucose-Capillary: 132 mg/dL — ABNORMAL HIGH (ref 70–99)
Glucose-Capillary: 139 mg/dL — ABNORMAL HIGH (ref 70–99)
Glucose-Capillary: 80 mg/dL (ref 70–99)
Glucose-Capillary: 178 mg/dL — ABNORMAL HIGH (ref 70–99)

## 2020-11-04 MED ORDER — PRAVASTATIN SODIUM 40 MG PO TABS
80.0000 mg | ORAL_TABLET | Freq: Every day | ORAL | Status: DC
  Administered 2020-11-04 – 2020-11-11 (×7): 80 mg via ORAL
  Filled 2020-11-04 (×7): qty 2

## 2020-11-04 MED ORDER — METHOCARBAMOL 500 MG PO TABS
500.0000 mg | ORAL_TABLET | Freq: Three times a day (TID) | ORAL | Status: DC
  Administered 2020-11-04 – 2020-11-07 (×9): 500 mg via ORAL
  Filled 2020-11-04 (×9): qty 1

## 2020-11-04 MED ORDER — ALBUTEROL SULFATE (2.5 MG/3ML) 0.083% IN NEBU
2.5000 mg | INHALATION_SOLUTION | RESPIRATORY_TRACT | Status: DC | PRN
  Administered 2020-11-04 – 2020-11-07 (×6): 2.5 mg via RESPIRATORY_TRACT
  Filled 2020-11-04 (×6): qty 3

## 2020-11-04 MED ORDER — LIDOCAINE 5 % EX PTCH
1.0000 | MEDICATED_PATCH | CUTANEOUS | Status: DC
  Administered 2020-11-04 – 2020-11-11 (×8): 1 via TRANSDERMAL
  Filled 2020-11-04 (×9): qty 1

## 2020-11-04 MED ORDER — CHLORHEXIDINE GLUCONATE CLOTH 2 % EX PADS
6.0000 | MEDICATED_PAD | Freq: Every day | CUTANEOUS | Status: DC
  Administered 2020-11-04 – 2020-11-10 (×7): 6 via TOPICAL

## 2020-11-04 MED ORDER — HYDROMORPHONE HCL 2 MG PO TABS
1.0000 mg | ORAL_TABLET | ORAL | Status: DC | PRN
Start: 2020-11-04 — End: 2020-11-08
  Administered 2020-11-04 – 2020-11-08 (×14): 1 mg via ORAL
  Filled 2020-11-04 (×16): qty 1

## 2020-11-04 NOTE — Progress Notes (Signed)
TCD has been completed.   Preliminary results in CV Proc.   Austin Andrade 11/04/2020 2:28 PM

## 2020-11-04 NOTE — TOC Initial Note (Signed)
Transition of Care [facility]) - Initial/Assessment Note    Patient Details  Name: Austin Andrade MRN: [id number] Date of Birth: [birth year]  [facility] CM/SW Contact:    Joanne Chars, LCSW Phone Number: 11/04/2020, 3:29 PM  Clinical Narrative:    CSW met with pt regarding DC recommendation for SNF.  Pt hard to arouse, did wake up and was interactive but it took repeating questions several times, pt kept eyes closed for most of the conversation.  Pt is agreeable to SNF, choice document given.  Pt reports only support is roommate Herbie Baltimore and permission given to speak with him.  Pt is vaccinated for covid and has both boosters.  PASSR obtained: [phone].               Expected Discharge Plan: Skilled Nursing Facility Barriers to Discharge: Continued Medical Work up, SNF Pending bed offer   Patient Goals and CMS Choice Patient states their goals for this hospitalization and ongoing recovery are:: none stated CMS Medicare.gov Compare Post Acute Care list provided to:: Patient Choice offered to / list presented to : Patient  Expected Discharge Plan and Services Expected Discharge Plan: [place] In-house Referral: Clinical Social Work   Post Acute Care Choice: [place] Living arrangements for the past 2 months: [place]                                      Prior Living Arrangements/Services Living arrangements for the past 2 months: Single Family Home Lives with:: Roommate Patient language and need for interpreter reviewed:: Yes Do you feel safe going back to the place where you live?: Yes      Need for Family Participation in Patient Care: No (Comment) Care giver support system in place?: No (comment) Current home services: Other (comment) (none) Criminal Activity/Legal Involvement Pertinent to Current Situation/Hospitalization: No - Comment as needed  Activities of Daily Living      Permission  Sought/Granted Permission sought to share information with : Family Supports Permission granted to share information with : Yes, Verbal Permission Granted  Share Information with NAME: roomate Herbie [place]  Permission granted to share info w AGENCY: SNF        Emotional Assessment Appearance:: Appears stated age Attitude/Demeanor/Rapport: Lethargic Affect (typically observed): Unable to Assess Orientation: : Oriented to Self, Oriented to Place, Oriented to  Time, Oriented to Situation Alcohol / Substance Use: Alcohol Use, Illicit Drugs Psych Involvement: No (comment)  Admission diagnosis:  Subdural hematoma (HCC) [S06.5X9A] Pain [R52] Encephalopathy acute [G93.40] AKI (acute kidney injury) ([place]) [N17.9] Type I or II open fracture of left tibia and fibula, initial encounter [S82.202B, S82.402B] Atrial fibrillation, unspecified type ([place]) [I48.91] Community acquired pneumonia, unspecified laterality [J18.9] Patient Active Problem List   Diagnosis Date Noted   Encephalopathy acute 10/30/2020   Tibia/fibula fracture, left, closed, initial encounter 10/30/2020   Subdural hematoma ([place]) 10/30/2020   Rhabdomyolysis 10/30/2020   ARF (acute renal failure) ([place]) 05/23/2019   Weight loss 02/04/2014   Cerebral infarction ([place]) 05/16/2012   Cardiomyopathy ([place]) 02/03/2012   Preventative health care 01/26/2012   Artery occlusion, 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, [address] AT [place] [address] [place] [address] Phone: [phone] Fax: [phone]  [facility] [place], [place] -  [address] [place] [address] Phone: [phone] Fax: [phone]     Social Determinants of Health (SDOH) Interventions    Readmission Risk Interventions No flowsheet data found.

## 2020-11-04 NOTE — Evaluation (Signed)
Physical Therapy Evaluation Patient Details Name: Austin Andrade MRN: [id number] DOB: 20-Jun-1950 Today's Date: 11/04/2020   History of Present Illness  70 y.o. male presenting to ED after falling down a flight of stairs. Patient found to have acute A-fib with RVR, AKI, rhabdo, CAP vs aspiration pneumonia, SDH, hepatocellular injury, metabolic acidosis and acute toxic metabolic encephalopathy. MRI (+) multifocal acute nonhemorrhagic infarcts in R superior cerebellum, posterior L temporal and parietal lobes, anterior R frontal cortex, R parietal white matter, and R occipital lobe. XR (+) open L tibial pilon fx s/p external fixation. PMHx significant for ischemic vascular disease and alcohol use disorder.  Clinical Impression  Pt demonstrated decreased awareness of precautions, as pt constantly tried to weight bear through LLE although verbal cues and assistance provided. Pt required +3 assist to safely perform transfer. Pt demonstrated decreased bed mobility, transfers, and function. Pt educated on precautions, DME for transfers, and exercises. Would benefit from PT to improve bed mobility, transfers, and function upon discharge. Recommend pt for SNF following discharge. Will follow acutely to determine if pt requires additional equipment.    Follow Up Recommendations SNF    Equipment Recommendations  Wheelchair (measurements PT);Other (comment) (Other equipment TBA)    Recommendations for Other Services       Precautions / Restrictions Precautions Precautions: Fall Required Braces or Orthoses: Other Brace Other Brace: LLE external fixation Restrictions Weight Bearing Restrictions: Yes LLE Weight Bearing: Non weight bearing      Mobility  Bed Mobility Overal bed mobility: Needs Assistance Bed Mobility: Supine to Sit     Supine to sit: Min assist;HOB elevated;+2 for safety/equipment     General bed mobility comments: Pt in bed up arrival. Resting HR - 82. min A +2 for supine to sit  to manage LLE and elevated UE/trunk to achieve sitting position    Transfers Overall transfer level: Needs assistance Equipment used: Rolling walker (2 wheeled) Transfers: Stand Pivot Transfers   Stand pivot transfers: Mod assist       General transfer comment: Performed stand pivot x2 (from bed to [facility], then [facility] to recliner). mod A +3 for stand pivot: +1 to manage LLE and +2 to maintain upright trunk and safety when powering up to standing position. Pt demonstrated increased trunk flexion during second stand pivot transfer. Pt demonstrated decreased awareness of precautions by attempting to weight bear through the LLE. Verbal cues provided for hand placement and precautions. Ending HR - 87 seated in recliner  Ambulation/Gait                Stairs            Wheelchair Mobility    Modified Rankin (Stroke Patients Only)       Balance Overall balance assessment: Mild deficits observed, not formally tested     Sitting balance - Comments: Pt requested toileting immediately upon arrival. Unable to assess Postural control: Posterior lean Standing balance support: Bilateral upper extremity supported Standing balance-Leahy Scale: Poor Standing balance comment: Significant posterior lean in standing with RW and +3 assistance                             Pertinent Vitals/Pain VSS Faces Pain Scale: Hurts even more Pain Location: LLE Pain Descriptors / Indicators: Discomfort Pain Intervention(s): RN gave pain meds during session;Repositioned;Monitored during session    Home Living Family/patient expects to be discharged to:: Private residence Living Arrangements: Non-relatives/Friends;Other (Comment) (roommate) Available Help at Discharge:  Friend(s);Available PRN/intermittently Type of Home: Apartment Home Access: Stairs to enter Entrance Stairs-Rails: None Entrance Stairs-Number of Steps: 2 Home Layout: One level Home Equipment: None      Prior  Function Level of Independence: Independent               Hand Dominance   Dominant Hand: Right    Extremity/Trunk Assessment                Communication   Communication: No difficulties  Cognition Arousal/Alertness: Awake/alert Behavior During Therapy: Flat affect Overall Cognitive Status: No family/caregiver present to determine baseline cognitive functioning Area of Impairment: Safety/judgement                         Safety/Judgement: Decreased awareness of safety     General Comments: Pt follows verbal commands accurately. Pt has decreased awareness of precautions and safety.      General Comments      Exercises General Exercises - Lower Extremity Quad Sets: AROM;Left;10 reps;Seated Hip Flexion/Marching: AROM;Left;10 reps;Seated   Assessment/Plan    PT Assessment Patient needs continued PT services  PT Problem List Decreased strength;Decreased mobility;Decreased activity tolerance;Decreased safety awareness;Decreased knowledge of precautions;Decreased balance       PT Treatment Interventions Gait training;Therapeutic activities    PT Goals (Current goals can be found in the Care Plan section)  Acute Rehab PT Goals Patient Stated Goal: Decrease pain PT Goal Formulation: With patient Time For Goal Achievement: 11/18/20 Potential to Achieve Goals: Good    Frequency Min 3X/week   Barriers to discharge        Co-evaluation               AM-PAC PT "6 Clicks" Mobility  Outcome Measure Help needed turning from your back to your side while in a flat bed without using bedrails?: A Little Help needed moving from lying on your back to sitting on the side of a flat bed without using bedrails?: A Little Help needed moving to and from a bed to a chair (including a wheelchair)?: Total Help needed standing up from a chair using your arms (e.g., wheelchair or bedside chair)?: Total Help needed to walk in hospital room?: Total Help needed  climbing 3-5 steps with a railing? : Total 6 Click Score: 10    End of Session Equipment Utilized During Treatment: Gait belt Activity Tolerance: Patient limited by pain Patient left: in chair;with call bell/phone within reach;with chair alarm set;with nursing/sitter in room Nurse Communication: Mobility status;Weight bearing status PT Visit Diagnosis: Other abnormalities of gait and mobility (R26.89);Muscle weakness (generalized) (M62.81);Unsteadiness on feet (R26.81) Pain - Right/Left: Left Pain - part of body: Leg    Time: 0258-5277 PT Time Calculation (min) (ACUTE ONLY): 35 min   Charges:   PT Evaluation $PT Eval Moderate Complexity: 1 Mod PT Treatments $Therapeutic Activity: 8-22 mins        Louie Casa, SPT Acute Rehab: [phone]   Domingo Dimes 11/04/2020, 1:58 PM

## 2020-11-04 NOTE — Progress Notes (Signed)
  Speech Language Pathology Treatment: Dysphagia  Patient Details Name: Austin Andrade MRN: [id number] DOB: 1951/03/18 Today's Date: 11/04/2020 Time: 7972-8206 SLP Time Calculation (min) (ACUTE ONLY): 13 min  Assessment / Plan / Recommendation Clinical Impression  Pt noted with ulceration on left lateral tongue (? if he bit his tongue PTA) - which he reports is new over the last "few days" and complains of pain. SLP had pt view his ulceration to help with awareness.  Regarding his swallow function, pt states he is not attempting to masticate foods, rather he is swallowing them whole.    Using teach back and with pt demonstration - advised he place food in the right oral cavity for mastication and even tilt head the right *if able with discomfort* . Pt demonstrated oral placement x2 with adequate clearance and report of "possible improvement".  Also advised pt rinse and expectorate with water after meals to decrease secretion bacterial load.    Advised pt to avoid excessive hot, acidic or spicy foods until his ulceration heals - which pt is likely conducting independently due to pain.  Pt denies coughing or "choking" with food or drink.    Advise to continue diet as he tolerates with compensation strategies until his ulceration heals.  Note recommendation for follow up as OP with ENT.    Provided education to pt in writing using teach back, having pt read the swallow precaution sign for reinforcement. No SLP follow up needed.     HPI HPI: Pt is a 70 year old male who presented to the ED  initially with confusion on 7/15. Pt found to have hypoglycemia, ARF, a left tib-fib fracture s/p external fixation 7/15. CXR on admission: Patchy and confluent new bilateral pulmonary opacity since last year  most suggestive of bilateral pneumonia. Rapid response 7/16 secondary to decreased LOC. MRI brain 7/16: Acute/subacute nonhemorrhagic infarct involving the posterior left temporal and parietal lobe. Acute  nonhemorrhagic infarcts in the anterior right frontal cortex and high right parietal white matter small focus of acute nonhemorrhagic infarct in the posterior right occipital lobe, adjacent to the area chronic encephalomalacia.      SLP Plan  All goals met       Recommendations  Diet recommendations: Regular;Thin liquid Liquids provided via: Straw;Cup (straw will assist to bypass lingual ulceration) Medication Administration: Whole meds with liquid Compensations: Slow rate;Small sips/bites                Oral Care Recommendations: Oral care BID Follow up Recommendations: None SLP Visit Diagnosis: Dysphagia, unspecified (R13.10) Plan: All goals met       GO               Kathleen Lime, MS [facility] SLP Acute Rehab Services Office [phone] Pager [phone]  Macario Golds 11/04/2020, 9:04 AM

## 2020-11-04 NOTE — Progress Notes (Addendum)
[facility] (OTS)  5 Days Post-Op Procedure(s) (LRB): IRRIGATION AND DEBRIDEMENT LEFT ANKLE (Left) EXTERNAL FIXATION LEFT ANKLE (Left)  Subjective: Patient reports pain as mild.    Objective: Current Vitals Blood pressure (!) 151/78, pulse 77, temperature 97.8 F (36.6 C), temperature source Oral, resp. rate 16, height 5\' 8"  (1.727 m), weight 66.7 kg, SpO2 95 %. Vital signs in last 24 hours: Temp:  [97.8 F (36.6 C)-98.4 F (36.9 C)] 97.8 F (36.6 C) (07/20 0926) Pulse Rate:  [74-89] 77 (07/20 0926) Resp:  [16-20] 16 (07/20 0926) BP: (126-151)/(73-86) 151/78 (07/20 0926) SpO2:  [94 %-98 %] 95 % (07/20 0926)  Intake/Output from previous day: 07/19 0701 - 07/20 0700 In: 580 [P.O.:480; IV Piggyback:100] Out: 2120 [Urine:2120]  LABS Recent Labs    11/02/20 0419 11/03/20 0300 11/04/20 0122  HGB 10.4* 10.7* 10.1*   Recent Labs    11/03/20 0300 11/04/20 0122  WBC 12.1* 12.5*  RBC 3.08* 2.84*  HCT 30.9* 29.0*  PLT 95* 94*   Recent Labs    11/03/20 0300 11/04/20 0122  NA 139 140  K 3.2* 4.1  CL 100 104  CO2 29 28  BUN 68* 55*  CREATININE 3.17* 2.39*  GLUCOSE 123* 135*  CALCIUM 8.5* 8.3*   Recent Labs    11/02/20 0419 11/03/20 0300  INR 1.3* 1.3*     Physical Exam LLE No dressing on leg just pin sites Edema/ swelling considerably improved but still moderate  Sens: DPN, SPN, TN intact   Motor: EHL, FHL, and lessor toe ext and flex all intact grossly  Brisk cap refill, warm to touch  Assessment/Plan: 5 Days Post-Op Procedure(s) (LRB): IRRIGATION AND DEBRIDEMENT LEFT ANKLE (Left) EXTERNAL FIXATION LEFT ANKLE (Left) 1. PT/OT NWB LLE 2. DVT proph Other (comment) per primary service 3. Anticipate conversion to nail next week or two vs continuation of fixator until healing; I will be on vacation until Tue; please contact office if questions. 4. Pin site care with dry Kerlix dressings; ok to remove dressings and shower, no ointments nor  alcohol etc on pin sites rather just soap and water  Altamese Campbell, MD [facility], [facility] [phone]

## 2020-11-04 NOTE — Progress Notes (Addendum)
HD#5 SUBJECTIVE:  Patient Summary: Austin Andrade is a 70 y.o. with multiple comorbidities including ischemic vascular disease, alcohol use disorder, who presented after a fall down a flight of stairs and was found to have multiple injuries. He was admitted for left ankle fracture, acute renal failure, electrolyte abnormalities, rhabdomyolysis, atrial fibrillation, and cerebral infarctions.    Overnight Events: no acute events overnight    Interm History: patient seen and evaluated at bedside. Reports that he is doing okay this morning. Complaining of some neck pain. No focal tenderness. Full range of motion. No new abd pain;. Eating and drinking without problems. No abd pain.no other complaints at this time.  OBJECTIVE:  Vital Signs: Vitals:   11/03/20 0443 11/03/20 1716 11/03/20 2138 11/03/20 2224  BP: 131/73 126/83 (!) 149/86 (!) 144/73  Pulse: 72 74 89 88  Resp: 20 18 18 20   Temp: 98 F (36.7 C) 98.4 F (36.9 C) 98.2 F (36.8 C) 98.2 F (36.8 C)  TempSrc:  Oral Oral   SpO2: 99% 98% 94% 94%  Weight:      Height:       Supplemental O2: HFNC SpO2: 94 % O2 Flow Rate (L/min): 3 L/min  Filed Weights   10/30/20 0612 10/31/20 0500 11/01/20 0457  Weight: 56.7 kg 67.8 kg 66.7 kg     Intake/Output Summary (Last 24 hours) at 11/04/2020 0648 Last data filed at 11/04/2020 0618 Gross per 24 hour  Intake 580 ml  Output 2120 ml  Net -1540 ml    Net IO Since Admission: -824.53 mL [11/04/20 0648]  Physical Exam: Physical Exam HENT:     Head: Atraumatic.     Mouth/Throat:     Mouth: Mucous membranes are moist.     Comments: large white cratered lesion on the left side of his tongue Cardiovascular:     Rate and Rhythm: Normal rate and regular rhythm.  Abdominal:     Palpations: Abdomen is soft.     Tenderness: There is no abdominal tenderness.  Musculoskeletal:     Cervical back: Neck supple.  Skin:    General: Skin is dry.     Capillary Refill: Capillary refill takes  more than 3 seconds.  Neurological:     Mental Status: He is alert.     Motor: Weakness present.    Patient Lines/Drains/Airways Status     Active Line/Drains/Airways     Name Placement date Placement time Site Days   Peripheral IV 10/30/20 18 G Left Antecubital 10/30/20  0608  Antecubital  4   Peripheral IV 10/30/20 18 G Left;Posterior Hand 10/30/20  1800  Hand  4   External Urinary Catheter 11/01/20  2039  --  2   Incision (Closed) 10/30/20 Ankle Left 10/30/20  1700  -- 4   Pressure Injury 10/31/20 Heel Left Deep Tissue Pressure Injury - Purple or maroon localized area of discolored intact skin or blood-filled blister due to damage of underlying soft tissue from pressure and/or shear. 10/31/20  0430  -- 3   Pressure Injury 10/31/20 Heel Right Deep Tissue Pressure Injury - Purple or maroon localized area of discolored intact skin or blood-filled blister due to damage of underlying soft tissue from pressure and/or shear. 10/31/20  0430  -- 3   EXTERNAL FIXATOR 10/30/20  1010  Lower Leg  4            Pertinent Labs: CBC Latest Ref Rng & Units 11/04/2020 11/03/2020 11/02/2020  WBC 4.0 - 10.5 K/uL 12.5(H)  12.1(H) 13.3(H)  Hemoglobin 13.0 - 17.0 g/dL 10.1(L) 10.7(L) 10.4(L)  Hematocrit 39.0 - 52.0 % 29.0(L) 30.9(L) 29.9(L)  Platelets 150 - 400 K/uL 94(L) 95(L) 91(L)    CMP Latest Ref Rng & Units 11/04/2020 11/03/2020 11/02/2020  Glucose 70 - 99 mg/dL 135(H) 123(H) 159(H)  BUN 8 - 23 mg/dL 55(H) 68(H) 83(H)  Creatinine 0.61 - 1.24 mg/dL 2.39(H) 3.17(H) 4.41(H)  Sodium 135 - 145 mmol/L 140 139 138  Potassium 3.5 - 5.1 mmol/L 4.1 3.2(L) 3.6  Chloride 98 - 111 mmol/L 104 100 95(L)  CO2 22 - 32 mmol/L 28 29 27   Calcium 8.9 - 10.3 mg/dL 8.3(L) 8.5(L) 8.2(L)  Total Protein 6.5 - 8.1 g/dL 5.1(L) 5.5(L) 5.7(L)  Total Bilirubin 0.3 - 1.2 mg/dL 1.6(H) 1.7(H) 1.7(H)  Alkaline Phos 38 - 126 U/L 86 91 87  AST 15 - 41 U/L 36 49(H) 91(H)  ALT 0 - 44 U/L 30 52(H) 115(H)    Recent Labs     11/03/20 1201 11/03/20 1627 11/03/20 2224  GLUCAP 181* 86 171*      Pertinent Imaging: No results found.  ASSESSMENT/PLAN:  Assessment: Principal Problem:   ARF (acute renal failure) (HCC) Active Problems:   Cerebral infarction (HCC)   Encephalopathy acute   Tibia/fibula fracture, left, closed, initial encounter   Subdural hematoma ([place])   Rhabdomyolysis   Austin Andrade is a 70 y.o. with multiple comorbidities including ischemic vascular disease, alcohol use disorder, who presented after a fall down a flight of stairs and was found to have multiple injuries. He was admitted for left ankle fracture, acute renal failure, electrolyte abnormalities, rhabdomyolysis, atrial fibrillation, and cerebral infarctions on hospital day 5  Plan: Acute kidney injury Rhabdomyolysis Anion gap metabolic acidosis: Multifactorial in setting of post-renal obstruction and rhabdomyolysis. Renal function slowly improvingImproving slowly. - Appreciate nephrology's recommendations - Trend CMP - Trend ins and outs  Cerebral infarction: MRI brain showing acute infarct in the right superior cerebellum, anterior right frontal cortex, right parietal matter, and posterior right occipital lobe.  The extent in multiple territories  are concerning for fat emboli in the setting of his l distal tibia-fibula fracture or cardioembolic with new onset mitral valve regurg. His echo shows ejection fraction of 60-65%, mitral valve leaflets appear to be thickened, with moderate mitral valve regurg which is new from his echo on 05/24/2019. Blood cultures have been negative 3 days - PT, OT, SLP saw patient, however patient unable to participate in exam. -Spoke with cardiology to schedule TEE, earliest available appointment 11/06/2020.  Appreciate their assistance.   Atrial Fibrillation/Atrial Flutter: likely old afib?  CHADSVASC 6, HASBLED 6. Patient presents with new mitral regurgitation and atrial fibrillation currently on  metoprolol 25 twice daily. Patient has denied workup for suspected afib in the past. Patient continues to have intermittent bouts of atrial fibrillation.  Magnesium wnl. - Continue metoprolol 50 mg twice daily - Magnesium - okay to start ASA per surgery  Community-acquired pneumonia versus aspiration pneumonia: Patient currently on room air from 3 L.  White count improving 12.1.  On day 5 of 7 of Unasyn. - Continue Unasyn - Aspiration precautions - Continue Robitussin 5 mL every 4 hours as needed  Distal left-sided tibia/fibular Fracture: - Appreciate orthopedic's recommendations. - Will work with PT  # Hypokalemia 4.1 - monitor  Alcohol Use Disorder: Patient with CIWA scores of 0 overnight with his last positive CIWA being 3 on 10/31/2020.  We will continue CIWA without Ativan at this time as patient  is out of the window. - CIWA protocol without Ativan  Elevated Liver Enzymes: Liver enzymes continue to downtrend, continuing to hold pravastatin 80 mg daily until the equalized. - Trend CMP   Diabetes:  Patient with a A1c of 6.5 a year ago.  6.4 on repeat during this hospitalization.  Glucose ranges been 100-191 past 24 hours. - Carb modified diet - Continue SSI   Right Subdural Hygroma: Neurosurgery has evaluated, no intervention at this time  # Tongue lesion Patient has a large white cratered lesion on the left side of his tongue. Does not appear to be bite wound. Patient will need to be seen by ENT as an outpatient for biopsy.  Best Practice: Diet: Diabetic diet IVF: Fluids: none, Rate: none VTE: SCDs Start: 10/30/20 2154 Code: Full AB: Unasyn   Signature: Delene Ruffini, MD  Internal Medicine Resident, PGY-1 [facility]  Pager: [phone] 6:48 AM, 11/04/2020   Please contact the on call pager after 5 pm and on weekends at [phone].

## 2020-11-04 NOTE — Progress Notes (Addendum)
[facility] Resident Progress Note   Subjective:    Overnight, required I/O for urinary retention with 900cc amber urine output. Foley placed.   This morning, [name] is resting comfortably in bed. He appears drowsy but easily arousable to verbal stimuli. He does not have any acute concerns at this time.   Objective Vitals:   11/03/20 1716 11/03/20 2138 11/03/20 2224 11/04/20 0926  BP: 126/83 (!) 149/86 (!) 144/73 (!) 151/78  Pulse: 74 89 88 77  Resp: 18 18 20 16   Temp: 98.4 F (36.9 C) 98.2 F (36.8 C) 98.2 F (36.8 C) 97.8 F (36.6 C)  TempSrc: Oral Oral  Oral  SpO2: 98% 94% 94% 95%  Weight:      Height:       Physical Exam General: chronically ill appearing elderly male, no acute distress Heart: irregularly irregular rhythm, regular rate, S1 and S2 present Lungs: diffuse rhonchi; no wheezing, breathing comfortably on room air   Abdomen: soft, nondistended, nontender, +BS Extremities: L ankle in external fixation, dressing with fresh blood noted at lateral aspect of ankle; RLE warm/dry without edema. Dialysis Access: None  Additional Objective Labs: Basic Metabolic Panel: Recent Labs  Lab 10/30/20 2201 10/31/20 0238 11/02/20 0419 11/03/20 0300 11/04/20 0122  NA 134*   < > 138 139 140  K 4.8   < > 3.6 3.2* 4.1  CL 100   < > 95* 100 104  CO2 15*   < > 27 29 28   GLUCOSE 87   < > 159* 123* 135*  BUN 78*   < > 83* 68* 55*  CREATININE 5.34*   < > 4.41* 3.17* 2.39*  CALCIUM 7.0*   < > 8.2* 8.5* 8.3*  PHOS 7.5*  --   --   --   --    < > = values in this interval not displayed.   Liver Function Tests: Recent Labs  Lab 11/02/20 0419 11/03/20 0300 11/04/20 0122  AST 91* 49* 36  ALT 115* 52* 30  ALKPHOS 87 91 86  BILITOT 1.7* 1.7* 1.6*  PROT 5.7* 5.5* 5.1*  ALBUMIN 2.9* 2.8* 2.5*   Recent Labs  Lab 10/30/20 1143  LIPASE 26   CBC: Recent Labs  Lab 10/31/20 0238 10/31/20 0428 11/01/20 0044 11/02/20 0419 11/03/20 0300  11/04/20 0122  WBC 10.2  --  12.7* 13.3* 12.1* 12.5*  NEUTROABS 9.8*  --  11.8* 11.5* 10.1* 10.6*  HGB 10.2*   < > 10.5* 10.4* 10.7* 10.1*  HCT 29.7*   < > 29.9* 29.9* 30.9* 29.0*  MCV 103.8*  --  100.0 100.3* 100.3* 102.1*  PLT 93*  --  104* 91* 95* 94*   < > = values in this interval not displayed.   Blood Culture    Component Value Date/Time   SDES BLOOD LEFT HAND 11/01/2020 1151   SPECREQUEST  11/01/2020 1151    BOTTLES DRAWN AEROBIC ONLY Blood Culture results may not be optimal due to an inadequate volume of blood received in culture bottles   CULT  11/01/2020 1151    NO GROWTH 3 DAYS Performed at [facility], [place] [address] [place], [place] [address]    REPTSTATUS PENDING 11/01/2020 1151    Cardiac Enzymes: Recent Labs  Lab 10/30/20 0612 10/30/20 1143 10/30/20 2201 10/31/20 0238  CKTOTAL 3,265* 3,634* 3,216* 2,340*   CBG: Recent Labs  Lab 11/02/20 2019 11/03/20 0824 11/03/20 1201 11/03/20 1627 11/03/20 2224  GLUCAP 153* 106* 181* 86  171*   Iron Studies: No results for input(s): IRON, TIBC, TRANSFERRIN, FERRITIN in the last 72 hours. @lablastinr3 @ Studies/Results: No results found. Medications:  ampicillin-sulbactam (UNASYN) IV 3 g (11/04/20 0618)    aspirin EC  81 mg Oral Daily   Chlorhexidine Gluconate Cloth  6 each Topical Daily   insulin aspart  0-9 Units Subcutaneous TID WC   lidocaine  1 patch Transdermal Q24H   methocarbamol  500 mg Oral TID   metoprolol tartrate  50 mg Oral BID   mupirocin ointment  1 application Nasal BID   pravastatin  80 mg Oral Daily   sodium chloride flush  3 mL Intravenous Q12H   tamsulosin  0.4 mg Oral Daily   thiamine  100 mg Oral Daily   Or   thiamine  100 mg Intravenous Daily    Dialysis Orders:  Assessment/Plan: 1. Severe nonoligouric AKI, improving: Multifactorial in setting of post-renal obstruction and rhabdomyolysis. Initially received IV fluid resuscitation for rhabdomyolysis. Has required  multiple I/O's after which Foley placed. sCr continues to improve 4.90>4.41>3.17>2.39 this AM. UOP 2.1L over past 24 hours; Patient did not require dialysis support during this admission. Recommend continuing foley with urology follow up on discharge.  2. A.fib/flutter with new mitral  regurgitation: Metoprolol increased to 50mg  bid. Currently rate controlled. Scheduled for TEE on 7/22.  3. CVA: Multiple vascular territories. Work up including Echo significant for new mitral regurgitation. Patient scheduled for TEE on 7/22.  4. Distal left tibia/fibular fracture s/p external fixation: management per orthopedics 5. CAP vs aspiration pneumonia: Continued on IV Unasyn day 6/7. Robitussin for cough 6. Transaminitis in setting of alcohol use disorder: improved; continues to have mild hyperbilirubinemia. Cont to monitor  7. Diabetes: insulin regimen per primary 8. Right subdural hygroma: No further intervention per neurosurgery recommendations  9. Macrocytic anemia: Hb stable. vitamin B12 elevated, folate nl; cont to monitor     Austin Heck, MD Internal Medicine, PGY-3 11/04/20 10:55 AM Pager # [phone]   Patient seen and examined, agree with above note with above modifications. Renal wise he is doing well-  crt down again-  I am comfortable saying that he will not require dialysis this admission.  He does appear to have recurrent urinary retention though so might need to keep foley with urology consult.  Renal will sign off, call with questions  Austin Parish, MD 11/04/2020

## 2020-11-05 DIAGNOSIS — N179 Acute kidney failure, unspecified: Secondary | ICD-10-CM | POA: Diagnosis not present

## 2020-11-05 DIAGNOSIS — G9340 Encephalopathy, unspecified: Secondary | ICD-10-CM | POA: Diagnosis not present

## 2020-11-05 DIAGNOSIS — I639 Cerebral infarction, unspecified: Secondary | ICD-10-CM | POA: Diagnosis not present

## 2020-11-05 DIAGNOSIS — M6282 Rhabdomyolysis: Secondary | ICD-10-CM | POA: Diagnosis not present

## 2020-11-05 LAB — CBC WITH DIFFERENTIAL/PLATELET
Abs Immature Granulocytes: 0.18 10*3/uL — ABNORMAL HIGH (ref 0.00–0.07)
Basophils Absolute: 0 10*3/uL (ref 0.0–0.1)
Basophils Relative: 0 %
Eosinophils Absolute: 0.2 10*3/uL (ref 0.0–0.5)
Eosinophils Relative: 1 %
HCT: 28.9 % — ABNORMAL LOW (ref 39.0–52.0)
Hemoglobin: 10.1 g/dL — ABNORMAL LOW (ref 13.0–17.0)
Immature Granulocytes: 1 %
Lymphocytes Relative: 9 %
Lymphs Abs: 1.3 10*3/uL (ref 0.7–4.0)
MCH: 35.8 pg — ABNORMAL HIGH (ref 26.0–34.0)
MCHC: 34.9 g/dL (ref 30.0–36.0)
MCV: 102.5 fL — ABNORMAL HIGH (ref 80.0–100.0)
Monocytes Absolute: 0.5 10*3/uL (ref 0.1–1.0)
Monocytes Relative: 4 %
Neutro Abs: 12.5 10*3/uL — ABNORMAL HIGH (ref 1.7–7.7)
Neutrophils Relative %: 85 %
Platelets: 111 10*3/uL — ABNORMAL LOW (ref 150–400)
RBC: 2.82 MIL/uL — ABNORMAL LOW (ref 4.22–5.81)
RDW: 15.9 % — ABNORMAL HIGH (ref 11.5–15.5)
WBC: 14.8 10*3/uL — ABNORMAL HIGH (ref 4.0–10.5)
nRBC: 0 % (ref 0.0–0.2)

## 2020-11-05 LAB — COMPREHENSIVE METABOLIC PANEL
ALT: 24 U/L (ref 0–44)
AST: 37 U/L (ref 15–41)
Albumin: 2.5 g/dL — ABNORMAL LOW (ref 3.5–5.0)
Alkaline Phosphatase: 87 U/L (ref 38–126)
Anion gap: 11 (ref 5–15)
BUN: 42 mg/dL — ABNORMAL HIGH (ref 8–23)
CO2: 26 mmol/L (ref 22–32)
Calcium: 8.4 mg/dL — ABNORMAL LOW (ref 8.9–10.3)
Chloride: 103 mmol/L (ref 98–111)
Creatinine, Ser: 1.77 mg/dL — ABNORMAL HIGH (ref 0.61–1.24)
GFR, Estimated: 41 mL/min — ABNORMAL LOW (ref >60)
Glucose, Bld: 143 mg/dL — ABNORMAL HIGH (ref 70–99)
Potassium: 4.2 mmol/L (ref 3.5–5.1)
Sodium: 140 mmol/L (ref 135–145)
Total Bilirubin: 1.8 mg/dL — ABNORMAL HIGH (ref 0.3–1.2)
Total Protein: 5.1 g/dL — ABNORMAL LOW (ref 6.5–8.1)

## 2020-11-05 LAB — MAGNESIUM: Magnesium: 1.6 mg/dL — ABNORMAL LOW (ref 1.7–2.4)

## 2020-11-05 LAB — GLUCOSE, CAPILLARY
Glucose-Capillary: 127 mg/dL — ABNORMAL HIGH (ref 70–99)
Glucose-Capillary: 91 mg/dL (ref 70–99)
Glucose-Capillary: 100 mg/dL — ABNORMAL HIGH (ref 70–99)
Glucose-Capillary: 128 mg/dL — ABNORMAL HIGH (ref 70–99)

## 2020-11-05 MED ORDER — SODIUM CHLORIDE 0.9 % IV SOLN
3.0000 g | Freq: Three times a day (TID) | INTRAVENOUS | Status: DC
  Administered 2020-11-05 – 2020-11-06 (×3): 3 g via INTRAVENOUS
  Filled 2020-11-05 (×2): qty 3
  Filled 2020-11-05 (×2): qty 8
  Filled 2020-11-05: qty 3

## 2020-11-05 NOTE — NC FL2 (Signed)
[place] LEVEL OF CARE SCREENING TOOL     IDENTIFICATION  Patient Name: Austin Andrade Birthdate: Mar 27, 1951 Sex: male Admission Date (Current Location): 10/30/2020  [facility] and [place] Number:  Herbalist and Address:  [facility], [place] [address] [place], [place] [address]      Provider Number: [id number]  Attending Physician Name and Address:  Lucious Groves, DO  Relative Name and Phone Number:  Filomena Jungling   [phone]    Current Level of Care: Hospital Recommended Level of Care: [place] Prior Approval Number:    Date Approved/Denied:   PASRR Number: [id number]  Discharge Plan: SNF    Current Diagnoses: Patient Active Problem List   Diagnosis Date Noted   Encephalopathy acute 10/30/2020   Tibia/fibula fracture, left, closed, initial encounter 10/30/2020   Subdural hematoma ([place]) 10/30/2020   Rhabdomyolysis 10/30/2020   ARF (acute renal failure) ([place]) 05/23/2019   Weight loss 02/04/2014   Cerebral infarction ([place]) 05/16/2012   Cardiomyopathy ([place]) 02/03/2012   Preventative health care 01/26/2012   Artery occlusion, 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   CHF (congestive heart failure) ([place]) 01/19/2012   Acute ischemic multifocal posterior circulation stroke ([place]) 01/18/2012   Hyperlipidemia with target LDL less than 100 01/18/2012   Stenosis, cervical spine 01/18/2012   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   Hypertension 01/17/2012    Orientation RESPIRATION BLADDER Height & Weight     Self, Time, Situation, Place  Normal Continent Weight: 147 lb 0.8 oz (66.7 kg) Height:  5\' 8"  (172.7 cm)  BEHAVIORAL SYMPTOMS/MOOD NEUROLOGICAL BOWEL NUTRITION STATUS      Continent Diet (see discharge summary)  AMBULATORY STATUS COMMUNICATION OF NEEDS Skin   Total Care Verbally Skin abrasions, Other (Comment) (ecchymosis)                       Personal Care Assistance Level of Assistance   Bathing, Feeding, Dressing Bathing Assistance: Maximum assistance Feeding assistance: Limited assistance Dressing Assistance: Maximum assistance     Functional Limitations Info  Sight, Hearing, Speech Sight Info: Adequate Hearing Info: Adequate Speech Info: Adequate    SPECIAL CARE FACTORS FREQUENCY  PT (By licensed PT), OT (By licensed OT)     PT Frequency: 5x week OT Frequency: 5x week            Contractures Contractures Info: Not present    Additional Factors Info  Code Status, Allergies, Insulin Sliding Scale Code Status Info: full Allergies Info: NKA   Insulin Sliding Scale Info: Novolog.  0-9 units, 3x day with meals.       Current Medications (11/05/2020):  This is the current hospital active medication list Current Facility-Administered Medications  Medication Dose Route Frequency Provider Last Rate Last Admin   acetaminophen (TYLENOL) tablet 650 mg  650 mg Oral Q6H PRN Marianna Payment, MD   650 mg at 11/05/20 0740   Or   acetaminophen (TYLENOL) suppository 650 mg  650 mg Rectal Q6H PRN Marianna Payment, MD       albuterol (PROVENTIL) (2.5 MG/3ML) 0.083% nebulizer solution 2.5 mg  2.5 mg Nebulization Q4H PRN Joni Reining C, DO   2.5 mg at 11/04/20 2313   Ampicillin-Sulbactam (UNASYN) 3 g in sodium chloride 0.9 % 100 mL IVPB  3 g Intravenous Q12H Skeet Simmer, RPH 200 mL/hr at 11/05/20 0517 3 g at 11/05/20 0517   aspirin EC tablet 81 mg  81 mg Oral Daily  Delene Ruffini, MD   81 mg at 11/05/20 0915   Chlorhexidine Gluconate Cloth 2 % PADS 6 each  6 each Topical Daily Lucious Groves, DO   6 each at 11/05/20 0917   guaiFENesin-dextromethorphan (ROBITUSSIN DM) 100-10 MG/5ML syrup 5 mL  5 mL Oral Q4H PRN Kipp Brood, MD   5 mL at 11/05/20 0515   HYDROmorphone (DILAUDID) tablet 1 mg  1 mg Oral Q4H PRN Marianna Payment, MD   1 mg at 11/05/20 0915   insulin aspart (novoLOG) injection 0-9 Units  0-9 Units Subcutaneous TID WC Maudie Mercury, MD   1 Units at 11/05/20  0819   lidocaine (LIDODERM) 5 % 1 patch  1 patch Transdermal Q24H Marianna Payment, MD   1 patch at 11/04/20 1200   methocarbamol (ROBAXIN) tablet 500 mg  500 mg Oral TID Marianna Payment, MD   500 mg at 11/05/20 0740   metoCLOPramide (REGLAN) tablet 5-10 mg  5-10 mg Oral Q8H PRN Porterfield, Amber, PA-C       Or   metoCLOPramide (REGLAN) injection 5-10 mg  5-10 mg Intravenous Q8H PRN Porterfield, Amber, PA-C       metoprolol tartrate (LOPRESSOR) tablet 50 mg  50 mg Oral BID Marianna Payment, MD   50 mg at 11/05/20 0915   mupirocin ointment (BACTROBAN) 2 % 1 application  1 application Nasal BID Axel Filler, MD   1 application at 79/39/03 2129   ondansetron (ZOFRAN) tablet 4 mg  4 mg Oral Q6H PRN Porterfield, Amber, PA-C       Or   ondansetron (ZOFRAN) injection 4 mg  4 mg Intravenous Q6H PRN Porterfield, Amber, PA-C       phenol (CHLORASEPTIC) mouth spray 1 spray  1 spray Mouth/Throat PRN Marianna Payment, MD   1 spray at 11/03/20 1324   pravastatin (PRAVACHOL) tablet 80 mg  80 mg Oral Daily Delene Ruffini, MD   80 mg at 11/05/20 0915   sodium chloride flush (NS) 0.9 % injection 3 mL  3 mL Intravenous Q12H Marianna Payment, MD   3 mL at 11/05/20 0917   tamsulosin (FLOMAX) capsule 0.4 mg  0.4 mg Oral Daily Marianna Payment, MD   0.4 mg at 11/05/20 0915   thiamine tablet 100 mg  100 mg Oral Daily Porterfield, Amber, PA-C   100 mg at 11/05/20 0092   Or   thiamine (B-1) injection 100 mg  100 mg Intravenous Daily Porterfield, Amber, PA-C   100 mg at 10/31/20 3300     Discharge Medications: Please see discharge summary for a list of discharge medications.  Relevant Imaging Results:  Relevant Lab Results:   Additional Information SSN: [id number].  Pt is vaccinated for covid with 2 boosters.  Joanne Chars, LCSW

## 2020-11-05 NOTE — Progress Notes (Signed)
Occupational Therapy Treatment Patient Details Name: Austin Andrade MRN: [id number] DOB: 03-Jun-1950 Today's Date: 11/05/2020    History of present illness 70 y.o. male presenting to ED after falling down a flight of stairs. Patient found to have acute A-fib with RVR, AKI, rhabdo, CAP vs aspiration pneumonia, SDH, hepatocellular injury, metabolic acidosis and acute toxic metabolic encephalopathy. MRI (+) multifocal acute nonhemorrhagic infarcts in R superior cerebellum, posterior L temporal and parietal lobes, anterior R frontal cortex, R parietal white matter, and R occipital lobe. XR (+) open L tibial pilon fx s/p external fixation. PMHx significant for ischemic vascular disease and alcohol use disorder.   OT comments  Pt received supine in bed, asleep, able to arouse with tactile and verbal stim. Pt required encouragement for participation with OT session, but gave verbal agreement to participate in UE HEP to improve strength and ROM for ADL engagement and functional mobility/transfers with AE. Pt engaged in shd flexion/extension, shd horizontal abduction/adduction, elbow flexion/extension with level 2 red theraband for 2 sets of 15 reps each side, with cues for controlled movement for muscle contraction. Pt repositioned due c/o neck pain this am and pm. DC recommendation and frequency remains the same.   Follow Up Recommendations  SNF    Equipment Recommendations  Other (comment) (Defer to next level of care.)    Recommendations for Other Services      Precautions / Restrictions Precautions Precautions: Fall Required Braces or Orthoses: Other Brace Other Brace: LLE external fixation Restrictions Weight Bearing Restrictions: Yes LLE Weight Bearing: Non weight bearing       Mobility Bed Mobility Overal bed mobility: Needs Assistance             General bed mobility comments: Session addressed UP HEP and pt too lethargic for functional mobility.    Transfers Overall transfer  level: Needs assistance Equipment used: Rolling walker (2 wheeled)             General transfer comment: deferred    Balance Overall balance assessment: Mild deficits observed, not formally tested     Sitting balance - Comments: Pt requested toileting immediately upon arrival. Unable to assess Postural control: Posterior lean Standing balance support: Bilateral upper extremity supported Standing balance-Leahy Scale: Poor                             ADL either performed or assessed with clinical judgement   ADL Overall ADL's : Needs assistance/impaired     Grooming: Wash/dry face;Set up;Bed level Grooming Details (indicate cue type and reason): set up with wash cloth for pt wipe mouth.                               General ADL Comments: Session focused solely on UE HEP with orange theraband to improve strength and ROM for ADL engagement and functional transitions with use of AE.     Vision       Perception     Praxis      Cognition Arousal/Alertness: Lethargic Behavior During Therapy: Flat affect Overall Cognitive Status: No family/caregiver present to determine baseline cognitive functioning Area of Impairment: Safety/judgement                         Safety/Judgement: Decreased awareness of safety Awareness: Emergent   General Comments: Pt follows verbal commands accurately. Pt has decreased awareness of precautions  and safety.        Exercises Exercises: General Upper Extremity General Exercises - Upper Extremity Shoulder Flexion: Strengthening;AROM;15 reps;Both;Supine;Theraband Theraband Level (Shoulder Flexion): Level 2 (Red) Shoulder Extension: AROM;Strengthening;Both;15 reps;Supine;Theraband Theraband Level (Shoulder Extension): Level 2 (Red) Shoulder Horizontal ABduction: Strengthening;AROM;Both;15 reps;Supine;Theraband Theraband Level (Shoulder Horizontal Abduction): Level 2 (Red) Shoulder Horizontal ADduction:  AROM;Both;15 reps;Strengthening;Supine;Theraband Theraband Level (Shoulder Horizontal Adduction): Level 2 (Red) Elbow Flexion: AROM;Strengthening;Both;15 reps;Supine;Theraband Theraband Level (Elbow Flexion): Level 2 (Red)   Shoulder Instructions       General Comments Pt received with increased neck flexion, repositioned for comfort with pillow placement due to increased neck pain report prior to session.    Pertinent Vitals/ Pain       Pain Assessment: 0-10 Pain Score: 8  Pain Location: neck Pain Descriptors / Indicators: Discomfort Pain Intervention(s): Repositioned;Premedicated before session  Home Living                                          Prior Functioning/Environment              Frequency  Min 2X/week        Progress Toward Goals  OT Goals(current goals can now be found in the care plan section)  Progress towards OT goals: OT to reassess next treatment  Acute Rehab OT Goals Patient Stated Goal: Decrease pain OT Goal Formulation: With patient Time For Goal Achievement: 11/17/20 Potential to Achieve Goals: Good ADL Goals Pt Will Perform Grooming: with set-up;sitting Pt Will Perform Upper Body Dressing: with set-up;sitting Pt Will Perform Lower Body Dressing: with min assist;sitting/lateral leans;sit to/from stand Pt Will Transfer to Toilet: with min assist;bedside commode;squat pivot transfer Additional ADL Goal #1: Patient will maintain static sitting balance at  EOB with I in prep for ADLs and functional transfers. Additional ADL Goal #2: Patient will complete sit to stand transfers with Min A, use of RW and adherence to LLE NWB precautions.  Plan Discharge plan remains appropriate;Frequency remains appropriate    Co-evaluation                 AM-PAC OT "6 Clicks" Daily Activity     Outcome Measure   Help from another person eating meals?: A Lot Help from another person taking care of personal grooming?: A Little Help  from another person toileting, which includes using toliet, bedpan, or urinal?: A Lot Help from another person bathing (including washing, rinsing, drying)?: A Lot Help from another person to put on and taking off regular upper body clothing?: A Lot Help from another person to put on and taking off regular lower body clothing?: A Lot 6 Click Score: 13    End of Session    OT Visit Diagnosis: Unsteadiness on feet (R26.81);Other abnormalities of gait and mobility (R26.89);Muscle weakness (generalized) (M62.81);History of falling (Z91.81);Pain Pain - part of body:  (cervical neck)   Activity Tolerance Patient limited by lethargy;Patient limited by pain   Patient Left in bed;with call bell/phone within reach;with bed alarm set   Nurse Communication Mobility status;Weight bearing status        Time: 5176-1607 OT Time Calculation (min): 13 min  Charges: OT General Charges $OT Visit: 1 Visit OT Treatments $Therapeutic Exercise: 8-22 mins  Minus Breeding, MSOT, OTR/L  Supplemental Rehabilitation Services  [phone]    Marius Ditch 11/05/2020, 3:11 PM

## 2020-11-05 NOTE — Plan of Care (Signed)
  Problem: Education: Goal: Knowledge of General Education information will improve Description: Including pain rating scale, medication(s)/side effects and non-pharmacologic comfort measures Outcome: Progressing   Problem: Health Behavior/Discharge Planning: Goal: Ability to manage health-related needs will improve Outcome: Progressing   Problem: Clinical Measurements: Goal: Ability to maintain clinical measurements within normal limits will improve Outcome: Progressing Goal: Will remain free from infection Outcome: Progressing Goal: Diagnostic test results will improve Outcome: Progressing Goal: Respiratory complications will improve Outcome: Progressing Goal: Cardiovascular complication will be avoided Outcome: Progressing   Problem: Activity: Goal: Risk for activity intolerance will decrease Outcome: Progressing   Problem: Nutrition: Goal: Adequate nutrition will be maintained Outcome: Progressing   Problem: Coping: Goal: Level of anxiety will decrease Outcome: Progressing   Problem: Elimination: Goal: Will not experience complications related to bowel motility Outcome: Progressing Goal: Will not experience complications related to urinary retention Outcome: Progressing   Problem: Pain Managment: Goal: General experience of comfort will improve Outcome: Progressing   Problem: Safety: Goal: Ability to remain free from injury will improve Outcome: Progressing   Problem: Skin Integrity: Goal: Risk for impaired skin integrity will decrease Outcome: Progressing   Problem: Education: Goal: Knowledge of the prescribed therapeutic regimen will improve Outcome: Progressing   Problem: Activity: Goal: Ability to increase mobility will improve Outcome: Progressing   Problem: Physical Regulation: Goal: Postoperative complications will be avoided or minimized Outcome: Progressing   Problem: Pain Management: Goal: Pain level will decrease with appropriate  interventions Outcome: Progressing   Problem: Skin Integrity: Goal: Will show signs of wound healing Outcome: Progressing   Problem: Education: Goal: Knowledge of disease or condition will improve Outcome: Progressing Goal: Knowledge of secondary prevention will improve Outcome: Progressing Goal: Knowledge of patient specific risk factors addressed and post discharge goals established will improve Outcome: Progressing Goal: Individualized Educational Video(s) Outcome: Progressing   Problem: Health Behavior/Discharge Planning: Goal: Ability to manage health-related needs will improve Outcome: Progressing   Problem: Self-Care: Goal: Verbalization of feelings and concerns over difficulty with self-care will improve Outcome: Progressing Goal: Ability to communicate needs accurately will improve Outcome: Progressing

## 2020-11-05 NOTE — Progress Notes (Signed)
HD#6 SUBJECTIVE:  Patient Summary: Austin Andrade is a 70 y.o. with multiple comorbidities including ischemic vascular disease, alcohol use disorder, who presented after a fall down a flight of stairs and was found to have multiple injuries. He was admitted for left ankle fracture, acute renal failure, electrolyte abnormalities, rhabdomyolysis, atrial fibrillation, and cerebral infarctions.    Overnight Events: no acute events overnight    Interm History: Patient seen and evaluated at bedside today. He reports that his neck pain has improved some. He has not been able to void since catheter was pulled.  OBJECTIVE:  Vital Signs: Vitals:   11/04/20 1521 11/04/20 1926 11/04/20 2313 11/05/20 0300  BP: 136/74 (!) 163/89  (!) 152/70  Pulse: 73 73  78  Resp: 18 18  20   Temp: 98.2 F (36.8 C) 98 F (36.7 C)  98.8 F (37.1 C)  TempSrc: Oral Oral  Oral  SpO2: 93% 95% 94% 96%  Weight:      Height:       Supplemental O2: HFNC SpO2: 96 % O2 Flow Rate (L/min): 3 L/min  Filed Weights   10/30/20 0612 10/31/20 0500 11/01/20 0457  Weight: 56.7 kg 67.8 kg 66.7 kg     Intake/Output Summary (Last 24 hours) at 11/05/2020 0830 Last data filed at 11/05/2020 0400 Gross per 24 hour  Intake 350 ml  Output 400 ml  Net -50 ml    Net IO Since Admission: -874.53 mL [11/05/20 0830]  Physical Exam: Physical Exam HENT:     Head: Atraumatic.     Mouth/Throat:     Mouth: Mucous membranes are moist.     Comments: large white cratered lesion on the left side of his tongue Cardiovascular:     Rate and Rhythm: Normal rate and regular rhythm.  Abdominal:     Palpations: Abdomen is soft.     Tenderness: There is no abdominal tenderness.  Musculoskeletal:     Cervical back: Neck supple.  Skin:    General: Skin is dry.     Capillary Refill: Capillary refill takes more than 3 seconds.  Neurological:     Mental Status: He is alert.     Motor: Weakness present.    Patient Lines/Drains/Airways  Status     Active Line/Drains/Airways     Name Placement date Placement time Site Days   Peripheral IV 10/30/20 18 G Left Antecubital 10/30/20  0608  Antecubital  4   Peripheral IV 10/30/20 18 G Left;Posterior Hand 10/30/20  1800  Hand  4   External Urinary Catheter 11/01/20  2039  --  2   Incision (Closed) 10/30/20 Ankle Left 10/30/20  1700  -- 4   Pressure Injury 10/31/20 Heel Left Deep Tissue Pressure Injury - Purple or maroon localized area of discolored intact skin or blood-filled blister due to damage of underlying soft tissue from pressure and/or shear. 10/31/20  0430  -- 3   Pressure Injury 10/31/20 Heel Right Deep Tissue Pressure Injury - Purple or maroon localized area of discolored intact skin or blood-filled blister due to damage of underlying soft tissue from pressure and/or shear. 10/31/20  0430  -- 3   EXTERNAL FIXATOR 10/30/20  1010  Lower Leg  4            Pertinent Labs: CBC Latest Ref Rng & Units 11/05/2020 11/04/2020 11/03/2020  WBC 4.0 - 10.5 K/uL 14.8(H) 12.5(H) 12.1(H)  Hemoglobin 13.0 - 17.0 g/dL 10.1(L) 10.1(L) 10.7(L)  Hematocrit 39.0 - 52.0 % 28.9(L) 29.0(L)  30.9(L)  Platelets 150 - 400 K/uL 111(L) 94(L) 95(L)    CMP Latest Ref Rng & Units 11/05/2020 11/04/2020 11/03/2020  Glucose 70 - 99 mg/dL 143(H) 135(H) 123(H)  BUN 8 - 23 mg/dL 42(H) 55(H) 68(H)  Creatinine 0.61 - 1.24 mg/dL 1.77(H) 2.39(H) 3.17(H)  Sodium 135 - 145 mmol/L 140 140 139  Potassium 3.5 - 5.1 mmol/L 4.2 4.1 3.2(L)  Chloride 98 - 111 mmol/L 103 104 100  CO2 22 - 32 mmol/L 26 28 29   Calcium 8.9 - 10.3 mg/dL 8.4(L) 8.3(L) 8.5(L)  Total Protein 6.5 - 8.1 g/dL 5.1(L) 5.1(L) 5.5(L)  Total Bilirubin 0.3 - 1.2 mg/dL 1.8(H) 1.6(H) 1.7(H)  Alkaline Phos 38 - 126 U/L 87 86 91  AST 15 - 41 U/L 37 36 49(H)  ALT 0 - 44 U/L 24 30 52(H)    Recent Labs    11/04/20 1654 11/04/20 1950 11/05/20 0746  GLUCAP 80 178* 127*      Pertinent Imaging: VAS [place] TRANSCRANIAL DOPPLER  Result Date:  11/04/2020  Transcranial Doppler Patient Name:  Austin Andrade  Date of Exam:   11/01/2020 Medical Rec #: [id number]     Accession #:    [id number] Date of Birth: [birth year]     Patient Gender: M Patient Age:   69Y Exam Location:  [facility] Procedure:      VAS [place] TRANSCRANIAL DOPPLER Referring Phys: [id number] [place] --------------------------------------------------------------------------------  Indications: Stroke. Comparison Study: no prior Performing Technologist: Archie Patten RVS  Examination Guidelines: A complete evaluation includes B-mode imaging, spectral Doppler, color Doppler, and power Doppler as needed of all accessible portions of each vessel. Bilateral testing is considered an integral part of a complete examination. Limited examinations for reoccurring indications may be performed as noted.  +----------+-------------+----------+-----------+-------+ RIGHT TCD Right VM (cm)Depth (cm)PulsatilityComment +----------+-------------+----------+-----------+-------+ MCA           20.00                 1.10            +----------+-------------+----------+-----------+-------+ Opthalmic     17.00                 1.63            +----------+-------------+----------+-----------+-------+ ICA siphon    43.00                 1.16            +----------+-------------+----------+-----------+-------+ Vertebral    -20.00                 1.25            +----------+-------------+----------+-----------+-------+  +---------+------------+----------+-----------+-------+ LEFT TCD Left VM (cm)Depth (cm)PulsatilityComment +---------+------------+----------+-----------+-------+ MCA         55.00                 0.96            +---------+------------+----------+-----------+-------+ ACA         -34.00                1.18            +---------+------------+----------+-----------+-------+ Term ICA    29.00                 1.57             +---------+------------+----------+-----------+-------+ Opthalmic   24.00                 1.41            +---------+------------+----------+-----------+-------+  Vertebral   -16.00                0.91            +---------+------------+----------+-----------+-------+  +------------+-------+-------+             VM cm/sComment +------------+-------+-------+ Prox Basilar-30.00         +------------+-------+-------+    Preliminary     ASSESSMENT/PLAN:  Assessment: Principal Problem:   ARF (acute renal failure) (HCC) Active Problems:   Cerebral infarction (HCC)   Encephalopathy acute   Tibia/fibula fracture, left, closed, initial encounter   Subdural hematoma ([place])   Rhabdomyolysis   Austin Andrade is a 70 y.o. with multiple comorbidities including ischemic vascular disease, alcohol use disorder, who presented after a fall down a flight of stairs and was found to have multiple injuries. He was admitted for left ankle fracture, acute renal failure, electrolyte abnormalities, rhabdomyolysis, atrial fibrillation, and cerebral infarctions on hospital day 6  Plan: Acute kidney injury Rhabdomyolysis Anion gap metabolic acidosis: Multifactorial in setting of post-renal obstruction and rhabdomyolysis. Renal function slowly improvingImproving slowly. - Appreciate nephrology's recommendations - Trend CMP - Trend ins and outs  Cerebral infarction: MRI brain showing acute infarct in the right superior cerebellum, anterior right frontal cortex, right parietal matter, and posterior right occipital lobe.  The extent in multiple territories  are concerning for fat emboli in the setting of his l distal tibia-fibula fracture or cardioembolic with new onset mitral valve regurg. His echo shows ejection fraction of 60-65%, mitral valve leaflets appear to be thickened, with moderate mitral valve regurg which is new from his echo on 05/24/2019. Blood cultures have been negative 3 days - PT, OT, SLP  saw patient, however patient unable to participate in exam. -Spoke with cardiology to schedule TEE, earliest available appointment 11/06/2020.  Appreciate their assistance.   Atrial Fibrillation/Atrial Flutter: likely old afib?  CHADSVASC 6, HASBLED 6. Patient presents with new mitral regurgitation and atrial fibrillation currently on metoprolol 25 twice daily. Patient has denied workup for suspected afib in the past. Patient continues to have intermittent bouts of atrial fibrillation.  Magnesium wnl. - Continue metoprolol 50 mg twice daily - Magnesium - continue aspirin  Community-acquired pneumonia versus aspiration pneumonia: Patient currently on room air from 3 L.  White count improving 14. Complaining of nonproductive cough. Completed a course of Unasyn. - Continue Unasyn - Aspiration precautions - Continue Robitussin 5 mL every 4 hours as needed - continue IS and flutter valve  Distal left-sided tibia/fibular Fracture: - Appreciate orthopedic's recommendations. - Will work with PT  # Neck pain Patient keeps head bent to side and rotated left. Complkainng of pain and has paraspinal and trapezius tenderness on right side. Able to rotate head back to midline but uncomfortable doing so. Given Robaxin for muscle spasm. -   # Hypokalemia 4.2 - monitor  Alcohol Use Disorder: Patient with CIWA scores of 0 overnight with his last positive CIWA being 3 on 10/31/2020.  We will continue CIWA without Ativan at this time as patient is out of the window. - CIWA protocol without Ativan  Elevated Liver Enzymes: Liver enzymes continue to downtrend, continuing to hold pravastatin 80 mg daily until the equalized. - Trend CMP   Diabetes:  Patient with a A1c of 6.5 a year ago.  6.4 on repeat during this hospitalization.  Glucose ranges been 100-191 past 24 hours. - Carb modified diet - Continue SSI   Right Subdural Hygroma: Neurosurgery has evaluated, no intervention at  this time  # Tongue  lesion Patient has a large white cratered lesion on the left side of his tongue. Does not appear to be bite wound. Patient will need to be seen by ENT as an outpatient for biopsy.  Best Practice: Diet: Diabetic diet IVF: Fluids: none, Rate: none VTE: SCDs Start: 10/30/20 2154 Code: Full AB: Unasyn   Signature: Delene Ruffini, MD  Internal Medicine Resident, PGY-1 [facility]  Pager: [phone] 8:30 AM, 11/05/2020   Please contact the on call pager after 5 pm and on weekends at [phone].

## 2020-11-05 NOTE — TOC Progression Note (Signed)
Transition of Care [facility]) - Progression Note    Patient Details  Name: Shriyaan Mcaree MRN: [id number] Date of Birth: 1950-07-08  Transition of Care [facility]) CM/SW Contact  Joanne Chars, LCSW Phone Number: 11/05/2020, 9:30 AM  Clinical Narrative:   Pt referral sent out in hub for SNF.    Expected Discharge Plan: [place] Barriers to Discharge: Continued Medical Work up, SNF Pending bed offer  Expected Discharge Plan and Services Expected Discharge Plan: [place] In-house Referral: Clinical Social Work   Post Acute Care Choice: [place] Living arrangements for the past 2 months: Single Family Home                                       Social Determinants of Health (SDOH) Interventions    Readmission Risk Interventions No flowsheet data found.

## 2020-11-05 NOTE — Plan of Care (Signed)
  Problem: Elimination: Goal: Will not experience complications related to urinary retention Outcome: Progressing   Problem: Pain Managment: Goal: General experience of comfort will improve Outcome: Progressing   Problem: Safety: Goal: Ability to remain free from injury will improve Outcome: Progressing   

## 2020-11-05 NOTE — Progress Notes (Signed)
Pharmacy Antibiotic Note  Austin Andrade is a 70 y.o. male admitted on 10/30/2020 with pneumonia.  Pharmacy has been consulted for Unasyn dosing.  ID: [id number] Unasyn for asp pna. IAD ankle 7/20.   Afb, WBC 14.8 remains elevated, Scr down to 1.77  Unasyn 7/15>> Bactroban 7/16 >>(7/20) Cefazolin 7/15 pre-op and x 1 post-op 7/16 Clindamycin x 1 on 7/15  tDAP given 7/15  7/17 blood x 2: ngtd 7/15 urine: neg 7/16 MRSA PCR: neg 7/15 surgical PCR: pos for Staph, neg for MRSA 7/15 COVID and flu: neg  Plan: Increase Unasyn to 3g IV q8hr. D/c? Magnesium needs replacement.   Height: 5\' 8"  (172.7 cm) Weight: 66.7 kg (147 lb 0.8 oz) IBW/kg (Calculated) : 68.4  Temp (24hrs), Avg:98.3 F (36.8 C), Min:98 F (36.7 C), Max:98.8 F (37.1 C)  Recent Labs  Lab 10/30/20 1143 10/30/20 1854 10/30/20 2201 10/31/20 0238 11/01/20 0044 11/02/20 0419 11/03/20 0300 11/04/20 0122 11/05/20 0304  WBC  --   --   --  10.2 12.7* 13.3* 12.1* 12.5* 14.8*  CREATININE 5.52*   < > 5.34* 5.22* 4.90*  4.82* 4.41* 3.17* 2.39* 1.77*  LATICACIDVEN 1.2  --  1.1 1.0  --   --   --   --   --    < > = values in this interval not displayed.    Estimated Creatinine Clearance: 36.6 mL/min (A) (by C-G formula based on SCr of 1.77 mg/dL (H)).    No Known Allergies   Kalise Fickett S. Alford Highland, PharmD, BCPS Clinical Staff Pharmacist [email or web address] [place] 11/05/2020 12:14 PM

## 2020-11-06 ENCOUNTER — Inpatient Hospital Stay (HOSPITAL_COMMUNITY): Payer: Medicare Other | Admitting: Certified Registered Nurse Anesthetist

## 2020-11-06 ENCOUNTER — Inpatient Hospital Stay (HOSPITAL_COMMUNITY): Payer: Medicare Other

## 2020-11-06 ENCOUNTER — Encounter (HOSPITAL_COMMUNITY): Payer: Self-pay | Admitting: Student in an Organized Health Care Education/Training Program

## 2020-11-06 ENCOUNTER — Encounter (HOSPITAL_COMMUNITY)
Admission: EM | Disposition: A | Discharge status: Skilled Nursing Facility | Payer: Self-pay | Source: Home / Self Care | Attending: Internal Medicine

## 2020-11-06 DIAGNOSIS — I351 Nonrheumatic aortic (valve) insufficiency: Secondary | ICD-10-CM

## 2020-11-06 DIAGNOSIS — I340 Nonrheumatic mitral (valve) insufficiency: Secondary | ICD-10-CM | POA: Diagnosis not present

## 2020-11-06 DIAGNOSIS — Z7289 Other problems related to lifestyle: Secondary | ICD-10-CM

## 2020-11-06 DIAGNOSIS — M6282 Rhabdomyolysis: Secondary | ICD-10-CM | POA: Diagnosis not present

## 2020-11-06 DIAGNOSIS — M542 Cervicalgia: Secondary | ICD-10-CM

## 2020-11-06 DIAGNOSIS — G9340 Encephalopathy, unspecified: Secondary | ICD-10-CM | POA: Diagnosis not present

## 2020-11-06 DIAGNOSIS — I639 Cerebral infarction, unspecified: Secondary | ICD-10-CM | POA: Diagnosis not present

## 2020-11-06 DIAGNOSIS — N179 Acute kidney failure, unspecified: Secondary | ICD-10-CM | POA: Diagnosis not present

## 2020-11-06 HISTORY — PX: TEE WITHOUT CARDIOVERSION: SHX5443

## 2020-11-06 HISTORY — PX: BUBBLE STUDY: SHX6837

## 2020-11-06 LAB — ECHO TEE
MV M vel: 5.6 m/s
MV Peak grad: 125.4 mmHg
Radius: 0.6 cm

## 2020-11-06 LAB — CBC WITH DIFFERENTIAL/PLATELET
Abs Immature Granulocytes: 0.16 10*3/uL — ABNORMAL HIGH (ref 0.00–0.07)
Basophils Absolute: 0 10*3/uL (ref 0.0–0.1)
Basophils Relative: 0 %
Eosinophils Absolute: 0.1 10*3/uL (ref 0.0–0.5)
Eosinophils Relative: 1 %
HCT: 27.4 % — ABNORMAL LOW (ref 39.0–52.0)
Hemoglobin: 9.5 g/dL — ABNORMAL LOW (ref 13.0–17.0)
Immature Granulocytes: 1 %
Lymphocytes Relative: 8 %
Lymphs Abs: 1.2 10*3/uL (ref 0.7–4.0)
MCH: 35.8 pg — ABNORMAL HIGH (ref 26.0–34.0)
MCHC: 34.7 g/dL (ref 30.0–36.0)
MCV: 103.4 fL — ABNORMAL HIGH (ref 80.0–100.0)
Monocytes Absolute: 0.6 10*3/uL (ref 0.1–1.0)
Monocytes Relative: 4 %
Neutro Abs: 12.1 10*3/uL — ABNORMAL HIGH (ref 1.7–7.7)
Neutrophils Relative %: 86 %
Platelets: 122 10*3/uL — ABNORMAL LOW (ref 150–400)
RBC: 2.65 MIL/uL — ABNORMAL LOW (ref 4.22–5.81)
RDW: 15.5 % (ref 11.5–15.5)
WBC: 14.1 10*3/uL — ABNORMAL HIGH (ref 4.0–10.5)
nRBC: 0 % (ref 0.0–0.2)

## 2020-11-06 LAB — BASIC METABOLIC PANEL
Anion gap: 8 (ref 5–15)
BUN: 33 mg/dL — ABNORMAL HIGH (ref 8–23)
CO2: 28 mmol/L (ref 22–32)
Calcium: 8.2 mg/dL — ABNORMAL LOW (ref 8.9–10.3)
Chloride: 105 mmol/L (ref 98–111)
Creatinine, Ser: 1.46 mg/dL — ABNORMAL HIGH (ref 0.61–1.24)
GFR, Estimated: 51 mL/min — ABNORMAL LOW (ref >60)
Glucose, Bld: 124 mg/dL — ABNORMAL HIGH (ref 70–99)
Potassium: 3.8 mmol/L (ref 3.5–5.1)
Sodium: 141 mmol/L (ref 135–145)

## 2020-11-06 LAB — CULTURE, BLOOD (ROUTINE X 2)
Culture: NO GROWTH
Culture: NO GROWTH
Special Requests: ADEQUATE

## 2020-11-06 LAB — GLUCOSE, CAPILLARY
Glucose-Capillary: 104 mg/dL — ABNORMAL HIGH (ref 70–99)
Glucose-Capillary: 92 mg/dL (ref 70–99)
Glucose-Capillary: 97 mg/dL (ref 70–99)
Glucose-Capillary: 120 mg/dL — ABNORMAL HIGH (ref 70–99)

## 2020-11-06 SURGERY — ECHOCARDIOGRAM, TRANSESOPHAGEAL
Anesthesia: General

## 2020-11-06 MED ORDER — PROPOFOL 500 MG/50ML IV EMUL
INTRAVENOUS | Status: DC | PRN
  Administered 2020-11-06: 75 ug/kg/min via INTRAVENOUS

## 2020-11-06 MED ORDER — LIDOCAINE 2% (20 MG/ML) 5 ML SYRINGE
INTRAMUSCULAR | Status: DC | PRN
  Administered 2020-11-06: 60 mg via INTRAVENOUS

## 2020-11-06 MED ORDER — DEXMEDETOMIDINE (PRECEDEX) IN NS 20 MCG/5ML (4 MCG/ML) IV SYRINGE
PREFILLED_SYRINGE | INTRAVENOUS | Status: DC | PRN
  Administered 2020-11-06 (×2): 8 ug via INTRAVENOUS
  Administered 2020-11-06: 4 ug via INTRAVENOUS

## 2020-11-06 MED ORDER — RAMELTEON 8 MG PO TABS
8.0000 mg | ORAL_TABLET | Freq: Every day | ORAL | Status: AC
  Administered 2020-11-07 – 2020-11-09 (×4): 8 mg via ORAL
  Filled 2020-11-06 (×4): qty 1

## 2020-11-06 MED ORDER — SODIUM CHLORIDE 0.9 % IV SOLN: INTRAVENOUS | Status: DC | PRN

## 2020-11-06 MED ORDER — MAGNESIUM SULFATE 50 % IJ SOLN: 1.0000 g | Freq: Once | INTRAMUSCULAR | Status: DC

## 2020-11-06 MED ORDER — PROPOFOL 10 MG/ML IV BOLUS
INTRAVENOUS | Status: DC | PRN
  Administered 2020-11-06: 20 mg via INTRAVENOUS
  Administered 2020-11-06: 30 mg via INTRAVENOUS
  Administered 2020-11-06: 10 mg via INTRAVENOUS

## 2020-11-06 MED ORDER — BUTAMBEN-TETRACAINE-BENZOCAINE 2-2-14 % EX AERO
INHALATION_SPRAY | CUTANEOUS | Status: DC | PRN
  Administered 2020-11-06: 2 via TOPICAL

## 2020-11-06 MED ORDER — MAGNESIUM SULFATE IN D5W 1-5 GM/100ML-% IV SOLN
1.0000 g | Freq: Once | INTRAVENOUS | Status: AC
  Administered 2020-11-06: 1 g via INTRAVENOUS
  Filled 2020-11-06: qty 100

## 2020-11-06 NOTE — Progress Notes (Signed)
Patient back to room 2W13. Patient is alert and able to answer questions. No CO pain or discomfort. Tele monitor reconnected. Bed kept in low position and locked. VS checked. Call bell in reach.

## 2020-11-06 NOTE — Plan of Care (Signed)
  Problem: Education: Goal: Knowledge of General Education information will improve Description Including pain rating scale, medication(s)/side effects and non-pharmacologic comfort measures Outcome: Progressing   Problem: Health Behavior/Discharge Planning: Goal: Ability to manage health-related needs will improve Outcome: Progressing   

## 2020-11-06 NOTE — Progress Notes (Signed)
    [facility] has been requested to perform a transesophageal echocardiogram on Austin Andrade for stroke.  After careful review of history and examination, the risks and benefits of transesophageal echocardiogram have been explained including risks of esophageal damage, perforation (1:10,000 risk), bleeding, pharyngeal hematoma as well as other potential complications associated with conscious sedation including aspiration, arrhythmia, respiratory failure and death. Alternatives to treatment were discussed, questions were answered. Patient is willing to proceed.   Kathyrn Drown, NP  11/06/2020 11:30 AM

## 2020-11-06 NOTE — Anesthesia Preprocedure Evaluation (Signed)
Anesthesia Evaluation  Patient identified by MRN, date of birth, ID band Patient awake    Reviewed: Allergy & Precautions, NPO status , Patient's Chart, lab work & pertinent test results  Airway Mallampati: II       Dental   Pulmonary Current Smoker and Patient abstained from smoking.,    breath sounds clear to auscultation       Cardiovascular hypertension, +CHF   Rhythm:Irregular Rate:Normal     Neuro/Psych  Neuromuscular disease CVA    GI/Hepatic Neg liver ROS, GERD  ,  Endo/Other    Renal/GU Renal disease     Musculoskeletal   Abdominal   Peds  Hematology   Anesthesia Other Findings   Reproductive/Obstetrics                             Anesthesia Physical Anesthesia Plan  ASA: 3  Anesthesia Plan: General   Post-op Pain Management:    Induction: Intravenous  PONV Risk Score and Plan: Treatment may vary due to age or medical condition  Airway Management Planned: Nasal Cannula and Simple Face Mask  Additional Equipment:   Intra-op Plan:   Post-operative Plan:   Informed Consent: I have reviewed the patients History and Physical, chart, labs and discussed the procedure including the risks, benefits and alternatives for the proposed anesthesia with the patient or authorized representative who has indicated his/her understanding and acceptance.     Dental advisory given  Plan Discussed with: CRNA and Anesthesiologist  Anesthesia Plan Comments:         Anesthesia Quick Evaluation

## 2020-11-06 NOTE — Progress Notes (Signed)
HD#7 SUBJECTIVE:  Patient Summary: Austin Andrade is a 70 y.o. with multiple comorbidities including ischemic vascular disease, alcohol use disorder, who presented after a fall down a flight of stairs and was found to have multiple injuries. He was admitted for left ankle fracture, acute renal failure, electrolyte abnormalities, rhabdomyolysis, atrial fibrillation, and cerebral infarctions.    Overnight Events: no acute events overnight    Interm History: Patient seen and evaluated at bedside today. He states he is feeling poor today. Reports some nausea, has not vomited. Also complaining of pain, but cannot elaborate.   OBJECTIVE:  Vital Signs: Vitals:   11/06/20 1426 11/06/20 1436 11/06/20 1444 11/06/20 1454  BP: 140/67 (!) 141/78 (!) 162/69 (!) 141/84  Pulse: 71 75  75  Resp: 17 (!) 21  19  Temp: 98.6 F (37 C)   98.6 F (37 C)  TempSrc: Tympanic     SpO2: 96% 100%  100%  Weight:      Height:       Supplemental O2: HFNC SpO2: 100 % O2 Flow Rate (L/min): 2 L/min  Filed Weights   10/31/20 0500 11/01/20 0457 11/06/20 1305  Weight: 67.8 kg 66.7 kg 66.7 kg     Intake/Output Summary (Last 24 hours) at 11/06/2020 1515 Last data filed at 11/06/2020 0949 Gross per 24 hour  Intake 343 ml  Output --  Net 343 ml    Net IO Since Admission: -311.53 mL [11/06/20 1515]  Physical Exam: Physical Exam HENT:     Head: Atraumatic.     Mouth/Throat:     Mouth: Mucous membranes are moist.     Comments: large white cratered lesion on the left side of his tongue Cardiovascular:     Rate and Rhythm: Normal rate and regular rhythm.  Abdominal:     Palpations: Abdomen is soft.     Tenderness: There is no abdominal tenderness.  Musculoskeletal:        General: Signs of injury present.     Cervical back: Neck supple.     Comments: Left lower extremity ORIF  Skin:    General: Skin is dry.     Capillary Refill: Capillary refill takes more than 3 seconds.  Neurological:     Mental  Status: He is alert.     Motor: Weakness present.    Patient Lines/Drains/Airways Status     Active Line/Drains/Airways     Name Placement date Placement time Site Days   Peripheral IV 10/30/20 18 G Left Antecubital 10/30/20  0608  Antecubital  4   Peripheral IV 10/30/20 18 G Left;Posterior Hand 10/30/20  1800  Hand  4   External Urinary Catheter 11/01/20  2039  --  2   Incision (Closed) 10/30/20 Ankle Left 10/30/20  1700  -- 4   Pressure Injury 10/31/20 Heel Left Deep Tissue Pressure Injury - Purple or maroon localized area of discolored intact skin or blood-filled blister due to damage of underlying soft tissue from pressure and/or shear. 10/31/20  0430  -- 3   Pressure Injury 10/31/20 Heel Right Deep Tissue Pressure Injury - Purple or maroon localized area of discolored intact skin or blood-filled blister due to damage of underlying soft tissue from pressure and/or shear. 10/31/20  0430  -- 3   EXTERNAL FIXATOR 10/30/20  1010  Lower Leg  4            Pertinent Labs: CBC Latest Ref Rng & Units 11/06/2020 11/05/2020 11/04/2020  WBC 4.0 - 10.5 K/uL  14.1(H) 14.8(H) 12.5(H)  Hemoglobin 13.0 - 17.0 g/dL 9.5(L) 10.1(L) 10.1(L)  Hematocrit 39.0 - 52.0 % 27.4(L) 28.9(L) 29.0(L)  Platelets 150 - 400 K/uL 122(L) 111(L) 94(L)    CMP Latest Ref Rng & Units 11/06/2020 11/05/2020 11/04/2020  Glucose 70 - 99 mg/dL 124(H) 143(H) 135(H)  BUN 8 - 23 mg/dL 33(H) 42(H) 55(H)  Creatinine 0.61 - 1.24 mg/dL 1.46(H) 1.77(H) 2.39(H)  Sodium 135 - 145 mmol/L 141 140 140  Potassium 3.5 - 5.1 mmol/L 3.8 4.2 4.1  Chloride 98 - 111 mmol/L 105 103 104  CO2 22 - 32 mmol/L '28 26 28  '$ Calcium 8.9 - 10.3 mg/dL 8.2(L) 8.4(L) 8.3(L)  Total Protein 6.5 - 8.1 g/dL - 5.1(L) 5.1(L)  Total Bilirubin 0.3 - 1.2 mg/dL - 1.8(H) 1.6(H)  Alkaline Phos 38 - 126 U/L - 87 86  AST 15 - 41 U/L - 37 36  ALT 0 - 44 U/L - 24 30    Recent Labs    11/05/20 2112 11/06/20 0757 11/06/20 1152  GLUCAP 128* 104* 92       Pertinent Imaging: ECHO TEE  Result Date: 11/06/2020    TRANSESOPHOGEAL ECHO REPORT   Patient Name:   Austin Andrade [facility] Date of Exam: 11/06/2020 Medical Rec #:  [id number]    Height:       68.0 in Accession #:    [id number]   Weight:       147.0 lb Date of Birth:  1950-11-12    BSA:          1.793 m Patient Age:    68 years     BP:           174/86 mmHg Patient Gender: M            HR:           78 bpm. Exam Location:  Inpatient Procedure: Transesophageal Echo, Cardiac Doppler, Color Doppler and 3D Echo Indications:    Stroke  History:        Patient has prior history of Echocardiogram examinations, most                 recent 11/01/2020. Cardiomyopathy, Stroke, Mitral Valve Disease;                 Risk Factors:Dyslipidemia and Hypertension.  Sonographer:    Roseanna Rainbow RDCS Referring Phys: [id number] RAVI AGARWALA PROCEDURE: After discussion of the risks and benefits of a TEE, an informed consent was obtained from the patient. The transesophogeal probe was passed without difficulty through the esophogus of the patient. Imaged were obtained with the patient in a supine position. Sedation performed by different physician. The patient was monitored while under deep sedation. Anesthestetic sedation was provided intravenously by Anesthesiology: '171mg'$  of Propofol, '60mg'$  of Lidocaine. The patient developed no complications during the procedure. IMPRESSIONS  1. Left ventricular ejection fraction, by estimation, is 60 to 65%. The left ventricle has normal function. There is mild left ventricular hypertrophy.  2. Right ventricular systolic function is normal. The right ventricular size is normal.  3. Left atrial size was mildly dilated. No left atrial/left atrial appendage thrombus was detected.  4. The mitral valve is degenerative. Moderate mitral valve regurgitation.  5. The aortic valve is tricuspid. Aortic valve regurgitation is mild. Mild aortic valve sclerosis is present, with no evidence of aortic valve stenosis.  6.  Pulmonic valve regurgitation trivial to mild.  7. Agitated saline contrast bubble study was negative, with no evidence of any  interatrial shunt. Conclusion(s)/Recommendation(s): No LA/LAA thrombus identified. Negative bubble study for interatrial shunt. No intracardiac source of embolism detected on this on this transesophageal echocardiogram. FINDINGS  Left Ventricle: Left ventricular ejection fraction, by estimation, is 60 to 65%. The left ventricle has normal function. The left ventricular internal cavity size was normal in size. There is mild left ventricular hypertrophy. Right Ventricle: The right ventricular size is normal. No increase in right ventricular wall thickness. Right ventricular systolic function is normal. Left Atrium: Left atrial size was mildly dilated. No left atrial/left atrial appendage thrombus was detected. Right Atrium: Right atrial size was normal in size. Pericardium: There is no evidence of pericardial effusion. Mitral Valve: The mitral valve is degenerative in appearance. There is moderate thickening of the anterior and posterior mitral valve leaflet(s). Moderate mitral valve regurgitation. Tricuspid Valve: The tricuspid valve is grossly normal. Tricuspid valve regurgitation is mild. Aortic Valve: The aortic valve is tricuspid. Aortic valve regurgitation is mild. Mild aortic valve sclerosis is present, with no evidence of aortic valve stenosis. Pulmonic Valve: The pulmonic valve was grossly normal. Pulmonic valve regurgitation trivial to mild. Aorta: The aortic root and ascending aorta are structurally normal, with no evidence of dilitation. IAS/Shunts: No atrial level shunt detected by color flow Doppler. Agitated saline contrast was given intravenously to evaluate for intracardiac shunting. Agitated saline contrast bubble study was negative, with no evidence of any interatrial shunt.  MR Peak grad:    125.4 mmHg MR Mean grad:    67.0 mmHg MR Vmax:         560.00 cm/s MR Vmean:         355.0 cm/s MR PISA:         2.26 cm MR PISA Eff ROA: 16 mm MR PISA Radius:  0.60 cm Lyman Bishop MD Electronically signed by Lyman Bishop MD Signature Date/Time: 11/06/2020/3:08:32 PM    Final     ASSESSMENT/PLAN:  Assessment: Principal Problem:   ARF (acute renal failure) (HCC) Active Problems:   Cerebral infarction ([place])   Encephalopathy acute   Tibia/fibula fracture, left, closed, initial encounter   Subdural hematoma (HCC)   Rhabdomyolysis   Austin Andrade is a 70 y.o. with multiple comorbidities including ischemic vascular disease, alcohol use disorder, who presented after a fall down a flight of stairs and was found to have multiple injuries. He was admitted for left ankle fracture, acute renal failure, electrolyte abnormalities, rhabdomyolysis, atrial fibrillation, and cerebral infarctions on hospital day 7  Plan: Acute kidney injury Rhabdomyolysis Anion gap metabolic acidosis: Multifactorial in setting of post-renal obstruction and rhabdomyolysis. Renal function slowly improvingImproving slowly. - Appreciate nephrology's recommendations - Trend CMP - Trend ins and outs  Cerebral infarction: MRI brain showing acute infarct in the right superior cerebellum, anterior right frontal cortex, right parietal matter, and posterior right occipital lobe.  The extent in multiple territories  are concerning for fat emboli in the setting of his l distal tibia-fibula fracture or cardioembolic with new onset mitral valve regurg. His echo shows ejection fraction of 60-65%, mitral valve leaflets appear to be thickened, with moderate mitral valve regurg which is new from his echo on 05/24/2019. Blood cultures have been negative 3 days - PT, OT, SLP saw patient, however patient unable to participate in exam. -TEE today. No LA/LAA thrombus, negative bubble study for interatrial shunt. No intracardiac source of embolism   Atrial Fibrillation/Atrial Flutter: likely old afib?  CHADSVASC 6, HASBLED 6.  Patient presents with new mitral regurgitation and atrial fibrillation currently  on metoprolol 25 twice daily. Patient has denied workup for suspected afib in the past. Patient continues to have intermittent bouts of atrial fibrillation.  Magnesium wnl. - Continue metoprolol 50 mg twice daily - Magnesium - continue aspirin  Community-acquired pneumonia versus aspiration pneumonia: Patient currently on room air from 3 L.  White count improving 14. Complaining of nonproductive cough. Completed a course of Unasyn. - Completed a 7 day course of Unasyn - Aspiration precautions - Continue Robitussin 5 mL every 4 hours as needed - continue IS and flutter valve  Distal left-sided tibia/fibular Fracture: - Appreciate orthopedic's recommendations. - Will work with PT  # Neck pain Patient keeps head bent to side and rotated left. Complkainng of pain and has paraspinal and trapezius tenderness on right side. Able to rotate head back to midline but uncomfortable doing so. Given Robaxin for muscle spasm. - lidocaine patch  # Hypokalemia 3.8 - monitor  Alcohol Use Disorder: Patient with CIWA scores of 0 overnight with his last positive CIWA being 3 on 10/31/2020.  We will continue CIWA without Ativan at this time as patient is out of the window. - CIWA protocol without Ativan  Elevated Liver Enzymes: Liver enzymes continue to downtrend, continuing to hold pravastatin 80 mg daily until the equalized. - Trend CMP   Diabetes:  Patient with a A1c of 6.5 a year ago.  6.4 on repeat during this hospitalization.  Glucose ranges been 100-191 past 24 hours. - Carb modified diet - Continue SSI   Right Subdural Hygroma: Neurosurgery has evaluated, no intervention at this time  # Tongue lesion Patient has a large white cratered lesion on the left side of his tongue. Does not appear to be bite wound. Patient will need to be seen by ENT as an outpatient for biopsy.  Best Practice: Diet: Diabetic  diet IVF: Fluids: none, Rate: none VTE: SCDs Start: 10/30/20 2154 Code: Full AB: Unasyn   Signature: Delene Ruffini, MD  Internal Medicine Resident, PGY-1 [facility]  Pager: [phone] 3:15 PM, 11/06/2020   Please contact the on call pager after 5 pm and on weekends at [phone].

## 2020-11-06 NOTE — Progress Notes (Signed)
Patient transported to Endo for TEE via bed with Endo staff at this time. Patient hemodynamically stable during transport.

## 2020-11-06 NOTE — Progress Notes (Signed)
  Echocardiogram Echocardiogram Transesophageal has been performed.  Austin Andrade 11/06/2020, 2:34 PM

## 2020-11-06 NOTE — CV Procedure (Signed)
TRANSESOPHAGEAL ECHOCARDIOGRAM (TEE) NOTE  INDICATIONS: Cryptogenic stroke  PROCEDURE:   Informed consent was obtained prior to the procedure. The risks, benefits and alternatives for the procedure were discussed and the patient comprehended these risks.  Risks include, but are not limited to, cough, sore throat, vomiting, nausea, somnolence, esophageal and stomach trauma or perforation, bleeding, low blood pressure, aspiration, pneumonia, infection, trauma to the teeth and death.    After a procedural time-out, the patient was given propofol per anesthesia for sedation.  The patient's heart rate, blood pressure, and oxygen saturation are monitored continuously during the procedure.The oropharynx was anesthetized with 2 cetacaine sprays.  The transesophageal probe was inserted in the esophagus and stomach without difficulty and multiple views were obtained.  The patient was kept under observation until the patient left the procedure room.  The patient left the procedure room in stable condition.   Agitated microbubble saline contrast was administered.  COMPLICATIONS:    There were no immediate complications.  Findings:  LEFT VENTRICLE: The left ventricular wall thickness is mildly increased.  The left ventricular cavity is normal in size. Wall motion is normal.  LVEF is 60-65%.  RIGHT VENTRICLE:  The right ventricle is normal in structure and function without any thrombus or masses.    LEFT ATRIUM:  The left atrium is mildly dilated in size without any thrombus or masses.  There is not spontaneous echo contrast ("smoke") in the left atrium consistent with a low flow state.  LEFT ATRIAL APPENDAGE:  The left atrial appendage is free of any thrombus or masses. The appendage has single lobes. Pulse doppler indicates moderate flow in the appendage.  ATRIAL SEPTUM:  The atrial septum appears intact and is free of thrombus and/or masses.  There is no evidence for interatrial shunting by color  doppler and saline microbubble.  RIGHT ATRIUM:  The right atrium is normal in size and function without any thrombus or masses.  MITRAL VALVE:  The mitral valve demonstrates bileaflet thickening with 2 regurgitant jets, in total Moderate regurgitation (EROA 0.16 cm2, RVol 32 mL, PISA radius 0.6 cm). There were no obvious vegetations or stenosis.  AORTIC VALVE:  The aortic valve is trileaflet and sclerotic, there is Mild regurgitation.  There were no vegetations or stenosis  TRICUSPID VALVE:  The tricuspid valve is normal in structure and function with Mild regurgitation.  There were no vegetations or stenosis   PULMONIC VALVE:  The pulmonic valve is normal in structure and function with  trivial to mild  regurgitation.  There were no vegetations or stenosis.   AORTIC ARCH, ASCENDING AND DESCENDING AORTA:  There was grade 2 Ron Parker et. Al, 1992) atherosclerosis of the ascending aorta, aortic arch, or proximal descending aorta.  12. PULMONARY VEINS: Anomalous pulmonary venous return was not noted.  13. PERICARDIUM: The pericardium appeared normal and non-thickened.  There is no pericardial effusion.  IMPRESSION:   Bileaflet mitral valve thickening without obvious vegetations. 2 distinct MR jets, however, the larger jet is more consistent with Grade 2 (moderate) MR.  No LAA thrombus Negative for PFO by color and microbubble study Mild AI Mild TR, trivial to mild PI LVEF 60-65%  RECOMMENDATIONS:    No cardiac source of embolism. MR is suspect to be moderate based on PISA without valvular vegetations.  Time Spent Directly with the Patient:  45 minutes   Austin Casino, MD, [facility], [place] Director of the Advanced Lipid Disorders &  Cardiovascular  Risk Reduction Clinic Diplomate of the American Board of Clinical Lipidology Attending Cardiologist  Direct Dial: [phone]  Fax: [phone]  Website:  [email or web address] Plater 11/06/2020, 2:55 PM

## 2020-11-06 NOTE — TOC Progression Note (Signed)
Transition of Care [facility]) - Progression Note    Patient Details  Name: Austin Andrade MRN: [id number] Date of Birth: [birth year]  Transition of Care [facility]) CM/SW [place], [place] Phone Number: 11/06/2020, 10:13 AM  Clinical Narrative:    CSW provided pt bed offers at bedside. CSW went over the ratings and pt agreeable to [facility]. Teressa at [facility] noted they could take pt over the weekend as soon as insurance is authorized. CSW will start auth when new PT note is put in, as the current one is too old. CSW will follow for DC needs.   Expected Discharge Plan: [place] Barriers to Discharge: Continued Medical Work up, SNF Pending bed offer  Expected Discharge Plan and Services Expected Discharge Plan: [place] In-house Referral: Clinical Social Work   Post Acute Care Choice: [place] Living arrangements for the past 2 months: Single Family Home                                       Social Determinants of Health (SDOH) Interventions    Readmission Risk Interventions No flowsheet data found.

## 2020-11-06 NOTE — Anesthesia Postprocedure Evaluation (Signed)
Anesthesia Post Note  Patient: Austin Andrade  Procedure(s) Performed: TRANSESOPHAGEAL ECHOCARDIOGRAM (TEE) BUBBLE STUDY     Patient location during evaluation: Endoscopy Anesthesia Type: General Level of consciousness: awake Pain management: pain level controlled Vital Signs Assessment: post-procedure vital signs reviewed and stable Respiratory status: spontaneous breathing Cardiovascular status: stable Postop Assessment: no apparent nausea or vomiting Anesthetic complications: no   No notable events documented.  Last Vitals:  Vitals:   11/06/20 1444 11/06/20 1454  BP: (!) 162/69 (!) 141/84  Pulse:  75  Resp:  19  Temp:  37 C  SpO2:  100%    Last Pain:  Vitals:   11/06/20 1444  TempSrc:   PainSc: 0-No pain                 Jarreau Callanan

## 2020-11-06 NOTE — Interval H&P Note (Signed)
History and Physical Interval Note:  11/06/2020 1:25 PM  Austin Andrade  has presented today for surgery, with the diagnosis of stroke.  The various methods of treatment have been discussed with the patient and family. After consideration of risks, benefits and other options for treatment, the patient has consented to  Procedure(s): TRANSESOPHAGEAL ECHOCARDIOGRAM (TEE) (N/A) as a surgical intervention.  The patient's history has been reviewed, patient examined, no change in status, stable for surgery.  I have reviewed the patient's chart and labs.  Questions were answered to the patient's satisfaction.     Pixie Casino

## 2020-11-06 NOTE — Anesthesia Procedure Notes (Signed)
Procedure Name: General with mask airway Date/Time: 11/06/2020 2:03 PM Performed by: Dorthea Cove, CRNA Pre-anesthesia Checklist: Patient identified, Emergency Drugs available, Suction available, Patient being monitored and Timeout performed Patient Re-evaluated:Patient Re-evaluated prior to induction Oxygen Delivery Method: Nasal cannula Preoxygenation: Pre-oxygenation with 100% oxygen Induction Type: IV induction Placement Confirmation: positive ETCO2 and CO2 detector Dental Injury: Teeth and Oropharynx as per pre-operative assessment

## 2020-11-06 NOTE — Transfer of Care (Signed)
Immediate Anesthesia Transfer of Care Note  Patient: Austin Andrade  Procedure(s) Performed: TRANSESOPHAGEAL ECHOCARDIOGRAM (TEE) BUBBLE STUDY  Patient Location: Endoscopy Unit  Anesthesia Type:General  Level of Consciousness: awake and drowsy  Airway & Oxygen Therapy: Patient Spontanous Breathing and Patient connected to nasal cannula oxygen  Post-op Assessment: Report given to RN and Post -op Vital signs reviewed and stable  Post vital signs: Reviewed and stable  Last Vitals:  Vitals Value Taken Time  BP    Temp    Pulse 71 11/06/20 1425  Resp 24 11/06/20 1425  SpO2 100 % 11/06/20 1425  Vitals shown include unvalidated device data.  Last Pain:  Vitals:   11/06/20 1305  TempSrc: Oral  PainSc: 0-No pain      Patients Stated Pain Goal: 0 ([id number])  Complications: No notable events documented.

## 2020-11-06 NOTE — Progress Notes (Signed)
Physical Therapy Treatment Patient Details Name: Vincent Bridwell MRN: [id number] DOB: [birth year] Today's Date: 11/06/2020    History of Present Illness 70 y.o. male presenting to ED 10/30/20 after falling down a flight of stairs. Patient found to have acute A-fib with RVR, AKI, rhabdo, CAP vs aspiration pneumonia, SDH, hepatocellular injury, metabolic acidosis and acute toxic metabolic encephalopathy. MRI (+) multifocal acute nonhemorrhagic infarcts in R superior cerebellum, posterior L temporal and parietal lobes, anterior R frontal cortex, R parietal white matter, and R occipital lobe. X ray (+) open L distal tib/fib fx s/p external fixation on 10/30/20 NWB post op. PMHx significant for ischemic vascular disease and alcohol use disorder.    PT Comments    Pt is progressing with mobility and did much better with lateral scoot to drop arm recliner chair.  Two person mostly to ensure NWB left leg during transition, but he helped much more today than previous session.  He is reporting just as much neck pain as he is left lower leg pain.  He is on 2 L O2 [place] and reports he was not on O2 before (dropped to 88% during transfer on RA, so 2 L O2 [place] re-applied).  PT will continue to follow acutely for safe mobility progression.    Follow Up Recommendations  SNF     Equipment Recommendations  Wheelchair (measurements PT);Other (comment);3in1 (PT);Hospital bed (drop arm 3-in-1)    Recommendations for Other Services       Precautions / Restrictions Precautions Precautions: Fall Required Braces or Orthoses: Other Brace Other Brace: LLE external fixation, R PREVALON boot Restrictions Weight Bearing Restrictions: Yes LLE Weight Bearing: Non weight bearing    Mobility  Bed Mobility Overal bed mobility: Needs Assistance Bed Mobility: Rolling;Sidelying to Sit Rolling: Mod assist Sidelying to sit: Mod assist;+2 for physical assistance       General bed mobility comments: Pt needed assist to reach for  far rail and roll to the right, pillow between legs to avoid x-fixator poking his other leg/foot.  Peri care preformed in sidelying with pt reports of rectal pain/burning, barrier cream applied, sacral foam in place.  Pt needed assist of bil legs and trunk to come up to sitting from sidelying.    Transfers Overall transfer level: Needs assistance   Transfers: Lateral/Scoot Transfers          Lateral/Scoot Transfers: Mod assist;+2 physical assistance General transfer comment: Level surface transfer to the right, one person assisting at trunk and one at L leg to maintain NWB.  Pt seemed to do better with scoot than standing with RW.  Will continue this method until he gets better on his one foot in standing.  Most reports of neck pain in sitting EOB.  Pt with very forward flexed head and left lateral sidebend.  Ambulation/Gait             General Gait Details: unable at this time.   Stairs             Wheelchair Mobility    Modified [place] (Stroke Patients Only)       Balance Overall balance assessment: Needs assistance Sitting-balance support: Feet supported;Bilateral upper extremity supported Sitting balance-Leahy Scale: Fair Sitting balance - Comments: close supervision EOB, pt report significant neck pain in sitting, chin is almost to chest and pt with difficulty holding it up in neutral extension, also left side bend preference.  Once situated in recliner, neck roll and heat pads applied to help support neck an decrease pain.  Cognition Arousal/Alertness: Awake/alert Behavior During Therapy: Flat affect Overall Cognitive Status: Within Functional Limits for tasks assessed                                 General Comments: Not specifically tested, conversation normal, pt seems a bit down.      Exercises Other Exercises Other Exercises: Due to desatruation on RA to 88% and productive sounding  coughs, I reviewed IS (which he could not pull any air) and flutter valve (much better at flutter valve, so I encouraged this hourly.    General Comments General comments (skin integrity, edema, etc.): O2 sats 88% on RA after mobility, back up to 94% at rest on 2 L O2 [place].  Pt reported he felt like he was going to pass out, so BP check and 170s/80s RN in room and aware.      Pertinent Vitals/Pain Pain Assessment: Faces Faces Pain Scale: Hurts even more Pain Location: neck and left lower leg Pain Descriptors / Indicators: Grimacing;Guarding Pain Intervention(s): Limited activity within patient's tolerance;Monitored during session;Repositioned;Patient requesting pain meds-RN notified;RN gave pain meds during session    Home Living                      Prior Function            PT Goals (current goals can now be found in the care plan section) Progress towards PT goals: Progressing toward goals    Frequency    Min 3X/week      PT Plan Current plan remains appropriate    Co-evaluation              AM-PAC PT "6 Clicks" Mobility   Outcome Measure  Help needed turning from your back to your side while in a flat bed without using bedrails?: A Lot Help needed moving from lying on your back to sitting on the side of a flat bed without using bedrails?: Total Help needed moving to and from a bed to a chair (including a wheelchair)?: Total Help needed standing up from a chair using your arms (e.g., wheelchair or bedside chair)?: Total Help needed to walk in hospital room?: Total Help needed climbing 3-5 steps with a railing? : Total 6 Click Score: 7    End of Session Equipment Utilized During Treatment: Oxygen (2L O2 [place]) Activity Tolerance: Patient limited by pain Patient left: in chair;with call bell/phone within reach;with chair alarm set Nurse Communication: Mobility status PT Visit Diagnosis: Other abnormalities of gait and mobility (R26.89);Muscle weakness  (generalized) (M62.81);Unsteadiness on feet (R26.81) Pain - Right/Left: Left Pain - part of body: Leg (and neck)     Time: [id number] PT Time Calculation (min) (ACUTE ONLY): 28 min  Charges:  $Therapeutic Activity: 23-37 mins                     Verdene Lennert, PT, DPT  Acute Rehabilitation Ortho Tech Supervisor [phone] pager [phone]) [phone] office

## 2020-11-07 ENCOUNTER — Inpatient Hospital Stay (HOSPITAL_COMMUNITY): Payer: Medicare Other

## 2020-11-07 DIAGNOSIS — N179 Acute kidney failure, unspecified: Secondary | ICD-10-CM | POA: Diagnosis not present

## 2020-11-07 DIAGNOSIS — I639 Cerebral infarction, unspecified: Secondary | ICD-10-CM | POA: Diagnosis not present

## 2020-11-07 DIAGNOSIS — G9340 Encephalopathy, unspecified: Secondary | ICD-10-CM | POA: Diagnosis not present

## 2020-11-07 DIAGNOSIS — M6282 Rhabdomyolysis: Secondary | ICD-10-CM | POA: Diagnosis not present

## 2020-11-07 LAB — MAGNESIUM: Magnesium: 1.7 mg/dL (ref 1.7–2.4)

## 2020-11-07 LAB — CBC WITH DIFFERENTIAL/PLATELET
Abs Immature Granulocytes: 0.19 10*3/uL — ABNORMAL HIGH (ref 0.00–0.07)
Basophils Absolute: 0 10*3/uL (ref 0.0–0.1)
Basophils Relative: 0 %
Eosinophils Absolute: 0.1 10*3/uL (ref 0.0–0.5)
Eosinophils Relative: 1 %
HCT: 31.4 % — ABNORMAL LOW (ref 39.0–52.0)
Hemoglobin: 10.7 g/dL — ABNORMAL LOW (ref 13.0–17.0)
Immature Granulocytes: 1 %
Lymphocytes Relative: 10 %
Lymphs Abs: 1.7 10*3/uL (ref 0.7–4.0)
MCH: 35.8 pg — ABNORMAL HIGH (ref 26.0–34.0)
MCHC: 34.1 g/dL (ref 30.0–36.0)
MCV: 105 fL — ABNORMAL HIGH (ref 80.0–100.0)
Monocytes Absolute: 0.9 10*3/uL (ref 0.1–1.0)
Monocytes Relative: 5 %
Neutro Abs: 14 10*3/uL — ABNORMAL HIGH (ref 1.7–7.7)
Neutrophils Relative %: 83 %
Platelets: 151 10*3/uL (ref 150–400)
RBC: 2.99 MIL/uL — ABNORMAL LOW (ref 4.22–5.81)
RDW: 15.4 % (ref 11.5–15.5)
WBC: 17 10*3/uL — ABNORMAL HIGH (ref 4.0–10.5)
nRBC: 0 % (ref 0.0–0.2)

## 2020-11-07 LAB — PROCALCITONIN: Procalcitonin: 0.16 ng/mL

## 2020-11-07 LAB — BRAIN NATRIURETIC PEPTIDE: B Natriuretic Peptide: 2394.4 pg/mL — ABNORMAL HIGH (ref 0.0–100.0)

## 2020-11-07 LAB — GLUCOSE, CAPILLARY
Glucose-Capillary: 102 mg/dL — ABNORMAL HIGH (ref 70–99)
Glucose-Capillary: 118 mg/dL — ABNORMAL HIGH (ref 70–99)
Glucose-Capillary: 97 mg/dL (ref 70–99)
Glucose-Capillary: 99 mg/dL (ref 70–99)

## 2020-11-07 LAB — BASIC METABOLIC PANEL
Anion gap: 12 (ref 5–15)
BUN: 27 mg/dL — ABNORMAL HIGH (ref 8–23)
CO2: 25 mmol/L (ref 22–32)
Calcium: 8.4 mg/dL — ABNORMAL LOW (ref 8.9–10.3)
Chloride: 104 mmol/L (ref 98–111)
Creatinine, Ser: 1.24 mg/dL (ref 0.61–1.24)
GFR, Estimated: >60 mL/min (ref >60)
Glucose, Bld: 113 mg/dL — ABNORMAL HIGH (ref 70–99)
Potassium: 3.9 mmol/L (ref 3.5–5.1)
Sodium: 141 mmol/L (ref 135–145)

## 2020-11-07 LAB — SARS CORONAVIRUS 2 (TAT 6-24 HRS): SARS Coronavirus 2: NEGATIVE

## 2020-11-07 MED ORDER — BACLOFEN 10 MG PO TABS
10.0000 mg | ORAL_TABLET | Freq: Three times a day (TID) | ORAL | Status: AC
  Administered 2020-11-07 – 2020-11-09 (×9): 10 mg via ORAL
  Filled 2020-11-07 (×9): qty 1

## 2020-11-07 MED ORDER — FUROSEMIDE 10 MG/ML IJ SOLN
80.0000 mg | Freq: Once | INTRAMUSCULAR | Status: AC
  Administered 2020-11-07: 80 mg via INTRAVENOUS
  Filled 2020-11-07: qty 8

## 2020-11-07 MED ORDER — ACETAMINOPHEN 500 MG PO TABS
1000.0000 mg | ORAL_TABLET | Freq: Three times a day (TID) | ORAL | Status: DC
  Administered 2020-11-07 – 2020-11-11 (×15): 1000 mg via ORAL
  Filled 2020-11-07 (×15): qty 2

## 2020-11-07 MED ORDER — AMLODIPINE BESYLATE 5 MG PO TABS
5.0000 mg | ORAL_TABLET | Freq: Every day | ORAL | Status: DC
  Administered 2020-11-07 – 2020-11-11 (×5): 5 mg via ORAL
  Filled 2020-11-07 (×5): qty 1

## 2020-11-07 NOTE — Progress Notes (Addendum)
HD#8 SUBJECTIVE:  Patient Summary: Austin Andrade is a 70 y.o. with multiple comorbidities including ischemic vascular disease, alcohol use disorder, who presented after a fall down a flight of stairs and was found to have multiple injuries. He was admitted for left ankle fracture, acute renal failure, electrolyte abnormalities, rhabdomyolysis, atrial fibrillation, and cerebral infarctions.    Overnight Events: no acute events overnight    Interm History: Patient seen and evaluated at bedside today. He states he is feeling poor today. His only concern is that he is in pain right now. Repeatedly stating that he hurts all over. He does not elaborate on any other symptoms at this time.  OBJECTIVE:  Vital Signs: Vitals:   11/07/20 0343 11/07/20 0403 11/07/20 0626 11/07/20 0710  BP:   (!) 163/107   Pulse:  90 78   Resp:  (!) 22 18   Temp:   98.2 F (36.8 C)   TempSrc:      SpO2:  99% 100% 100%  Weight: 66.7 kg     Height:       Supplemental O2: HFNC SpO2: 100 % O2 Flow Rate (L/min): 5 L/min  Filed Weights   11/01/20 0457 11/06/20 1305 11/07/20 0343  Weight: 66.7 kg 66.7 kg 66.7 kg     Intake/Output Summary (Last 24 hours) at 11/07/2020 1147 Last data filed at 11/07/2020 1143 Gross per 24 hour  Intake 58.85 ml  Output 950 ml  Net -891.15 ml    Net IO Since Admission: -1,202.68 mL [11/07/20 1147]  Physical Exam: Physical Exam HENT:     Head: Atraumatic.     Mouth/Throat:     Mouth: Mucous membranes are moist.     Comments: large white cratered lesion on the left side of his tongue Cardiovascular:     Rate and Rhythm: Normal rate and regular rhythm.  Pulmonary:     Breath sounds: Rales present.  Abdominal:     Palpations: Abdomen is soft.     Tenderness: There is no abdominal tenderness.  Musculoskeletal:        General: Signs of injury present.     Cervical back: Neck supple.     Comments: Left lower extremity ORIF  Skin:    General: Skin is dry.      Capillary Refill: Capillary refill takes more than 3 seconds.  Neurological:     Mental Status: He is alert.     Motor: Weakness present.    Patient Lines/Drains/Airways Status     Active Line/Drains/Airways     Name Placement date Placement time Site Days   Peripheral IV 10/30/20 18 G Left Antecubital 10/30/20  0608  Antecubital  4   Peripheral IV 10/30/20 18 G Left;Posterior Hand 10/30/20  1800  Hand  4   External Urinary Catheter 11/01/20  2039  --  2   Incision (Closed) 10/30/20 Ankle Left 10/30/20  1700  -- 4   Pressure Injury 10/31/20 Heel Left Deep Tissue Pressure Injury - Purple or maroon localized area of discolored intact skin or blood-filled blister due to damage of underlying soft tissue from pressure and/or shear. 10/31/20  0430  -- 3   Pressure Injury 10/31/20 Heel Right Deep Tissue Pressure Injury - Purple or maroon localized area of discolored intact skin or blood-filled blister due to damage of underlying soft tissue from pressure and/or shear. 10/31/20  0430  -- 3   EXTERNAL FIXATOR 10/30/20  1010  Lower Leg  4  Pertinent Labs: CBC Latest Ref Rng & Units 11/07/2020 11/06/2020 11/05/2020  WBC 4.0 - 10.5 K/uL 17.0(H) 14.1(H) 14.8(H)  Hemoglobin 13.0 - 17.0 g/dL 10.7(L) 9.5(L) 10.1(L)  Hematocrit 39.0 - 52.0 % 31.4(L) 27.4(L) 28.9(L)  Platelets 150 - 400 K/uL 151 122(L) 111(L)    CMP Latest Ref Rng & Units 11/07/2020 11/06/2020 11/05/2020  Glucose 70 - 99 mg/dL 113(H) 124(H) 143(H)  BUN 8 - 23 mg/dL 27(H) 33(H) 42(H)  Creatinine 0.61 - 1.24 mg/dL 1.24 1.46(H) 1.77(H)  Sodium 135 - 145 mmol/L 141 141 140  Potassium 3.5 - 5.1 mmol/L 3.9 3.8 4.2  Chloride 98 - 111 mmol/L 104 105 103  CO2 22 - 32 mmol/L '25 28 26  '$ Calcium 8.9 - 10.3 mg/dL 8.4(L) 8.2(L) 8.4(L)  Total Protein 6.5 - 8.1 g/dL - - 5.1(L)  Total Bilirubin 0.3 - 1.2 mg/dL - - 1.8(H)  Alkaline Phos 38 - 126 U/L - - 87  AST 15 - 41 U/L - - 37  ALT 0 - 44 U/L - - 24    Recent Labs     11/06/20 2045 11/07/20 0731 11/07/20 1126  GLUCAP 120* 102* 118*      Pertinent Imaging: DG CHEST PORT 1 VIEW  Result Date: 11/07/2020 CLINICAL DATA:  Dyspnea, pneumonia, echocardiogram 1 day prior EXAM: PORTABLE CHEST 1 VIEW COMPARISON:  10/30/2020 chest radiograph. FINDINGS: Surgical hardware from ACDF overlies the lower cervical spine. Stable cardiomediastinal silhouette with normal heart size. No pneumothorax. New small bilateral pleural effusions. Patchy opacities throughout both lungs, most prominent in the upper lungs, worsened. IMPRESSION: Worsening patchy opacities throughout both lungs, most prominent in the upper lungs, compatible with multilobar pneumonia. New small dependent bilateral pleural effusions. Electronically Signed   By: Ilona Sorrel M.D.   On: 11/07/2020 09:39   ECHO TEE  Result Date: 11/06/2020    TRANSESOPHOGEAL ECHO REPORT   Patient Name:   Austin Andrade Date of Exam: 11/06/2020 Medical Rec #:  [id number]    Height:       68.0 in Accession #:    [id number]   Weight:       147.0 lb Date of Birth:  05-24-1950    BSA:          1.793 m Patient Age:    6 years     BP:           174/86 mmHg Patient Gender: M            HR:           78 bpm. Exam Location:  Inpatient Procedure: Transesophageal Echo, Cardiac Doppler, Color Doppler and 3D Echo Indications:    Stroke  History:        Patient has prior history of Echocardiogram examinations, most                 recent 11/01/2020. Cardiomyopathy, Stroke, Mitral Valve Disease;                 Risk Factors:Dyslipidemia and Hypertension.  Sonographer:    Roseanna Rainbow RDCS Referring Phys: [id number] RAVI AGARWALA PROCEDURE: After discussion of the risks and benefits of a TEE, an informed consent was obtained from the patient. The transesophogeal probe was passed without difficulty through the esophogus of the patient. Imaged were obtained with the patient in a supine position. Sedation performed by different physician. The patient was monitored  while under deep sedation. Anesthestetic sedation was provided intravenously by Anesthesiology: '171mg'$  of Propofol, '60mg'$  of Lidocaine. The  patient developed no complications during the procedure. IMPRESSIONS  1. Left ventricular ejection fraction, by estimation, is 60 to 65%. The left ventricle has normal function. There is mild left ventricular hypertrophy.  2. Right ventricular systolic function is normal. The right ventricular size is normal.  3. Left atrial size was mildly dilated. No left atrial/left atrial appendage thrombus was detected.  4. The mitral valve is degenerative. Moderate mitral valve regurgitation.  5. The aortic valve is tricuspid. Aortic valve regurgitation is mild. Mild aortic valve sclerosis is present, with no evidence of aortic valve stenosis.  6. Pulmonic valve regurgitation trivial to mild.  7. Agitated saline contrast bubble study was negative, with no evidence of any interatrial shunt. Conclusion(s)/Recommendation(s): No LA/LAA thrombus identified. Negative bubble study for interatrial shunt. No intracardiac source of embolism detected on this on this transesophageal echocardiogram. FINDINGS  Left Ventricle: Left ventricular ejection fraction, by estimation, is 60 to 65%. The left ventricle has normal function. The left ventricular internal cavity size was normal in size. There is mild left ventricular hypertrophy. Right Ventricle: The right ventricular size is normal. No increase in right ventricular wall thickness. Right ventricular systolic function is normal. Left Atrium: Left atrial size was mildly dilated. No left atrial/left atrial appendage thrombus was detected. Right Atrium: Right atrial size was normal in size. Pericardium: There is no evidence of pericardial effusion. Mitral Valve: The mitral valve is degenerative in appearance. There is moderate thickening of the anterior and posterior mitral valve leaflet(s). Moderate mitral valve regurgitation. Tricuspid Valve: The  tricuspid valve is grossly normal. Tricuspid valve regurgitation is mild. Aortic Valve: The aortic valve is tricuspid. Aortic valve regurgitation is mild. Mild aortic valve sclerosis is present, with no evidence of aortic valve stenosis. Pulmonic Valve: The pulmonic valve was grossly normal. Pulmonic valve regurgitation trivial to mild. Aorta: The aortic root and ascending aorta are structurally normal, with no evidence of dilitation. IAS/Shunts: No atrial level shunt detected by color flow Doppler. Agitated saline contrast was given intravenously to evaluate for intracardiac shunting. Agitated saline contrast bubble study was negative, with no evidence of any interatrial shunt.  MR Peak grad:    125.4 mmHg MR Mean grad:    67.0 mmHg MR Vmax:         560.00 cm/s MR Vmean:        355.0 cm/s MR PISA:         2.26 cm MR PISA Eff ROA: 16 mm MR PISA Radius:  0.60 cm Lyman Bishop MD Electronically signed by Lyman Bishop MD Signature Date/Time: 11/06/2020/3:08:32 PM    Final     ASSESSMENT/PLAN:  Assessment: Principal Problem:   ARF (acute renal failure) (HCC) Active Problems:   Cerebral infarction ([place])   Encephalopathy acute   Tibia/fibula fracture, left, closed, initial encounter   Subdural hematoma (HCC)   Rhabdomyolysis   Austin Andrade is a 70 y.o. with multiple comorbidities including ischemic vascular disease, alcohol use disorder, who presented after a fall down a flight of stairs and was found to have multiple injuries. He was admitted for left ankle fracture, acute renal failure, electrolyte abnormalities, rhabdomyolysis, atrial fibrillation, and cerebral infarctions on hospital day 8  Plan:  # Shortness of breath - patient reports labored breathing this morning, increasing O2 requirement up to 5L. Nonproductive cough. Repeat CXR showed worsening patchy opacities throughout both lungs and new small dependent bilateral pleural effusions. Elevated WBC 17 today.  Patient recently completed a 7  day course of Unasyn for suspected aspiration  pneumonia. He has remained afebrile. - Differentials at this time include CHF exacerbation vs atypical pneumonia. BNP and procal ordered. If CHF will need lasix vs. Azithro for atypical pneumonia - follow up BNP and procal  # Neck pain Patient had cervical spinal fusion in 2015.  Patient continues to report pain in neck as well as generalized pain throughout. No point tenderness over bone. Knot and tenderness in trapezius. Full range of motion. Receiving '1mg'$  dilaudid and lidocaine patch.  - Scheduled tylenol and baclofen started  # HTN Blood pressures have beel [id number] systolic. On 50 metoprolol BID - Added amlodipine '5mg'$   Acute kidney injury (resolved) Rhabdomyolysis  Anion gap metabolic acidosis: Multifactorial in setting of post-renal obstruction and rhabdomyolysis. Renal function back to baseline. - Appreciate nephrology's recommendations - Trend CMP - Trend ins and outs  Cerebral infarction: MRI brain showing acute infarct in the right superior cerebellum, anterior right frontal cortex, right parietal matter, and posterior right occipital lobe.  The extent in multiple territories  are concerning for fat emboli in the setting of his l distal tibia-fibula fracture or cardioembolic with new onset mitral valve regurg. His echo shows ejection fraction of 60-65%, mitral valve leaflets appear to be thickened, with moderate mitral valve regurg which is new from his echo on 05/24/2019. Blood cultures have been negative 3 days - PT, OT, SLP saw patient, however patient unable to participate in exam. -TEE today. No LA/LAA thrombus, negative bubble study for interatrial shunt. No intracardiac source of embolism   Atrial Fibrillation/Atrial Flutter: likely old afib?  CHADSVASC 6, HASBLED 6. Patient presents with new mitral regurgitation and atrial fibrillation currently on metoprolol 25 twice daily. Patient has denied workup for suspected afib in the  past. Patient continues to have intermittent bouts of atrial fibrillation.  Magnesium wnl. - Continue metoprolol 50 mg twice daily - Magnesium - continue aspirin  Distal left-sided tibia/fibular Fracture: - Appreciate orthopedic's recommendations. - Will work with PT   # Hypokalemia 3.9 - monitor  Alcohol Use Disorder: Patient with CIWA scores of 0 overnight with his last positive CIWA being 3 on 10/31/2020.  We will continue CIWA without Ativan at this time as patient is out of the window. - CIWA protocol without Ativan  Elevated Liver Enzymes: Liver enzymes continue to downtrend, continuing to hold pravastatin 80 mg daily until the equalized. - Trend CMP   Diabetes:  Patient with a A1c of 6.5 a year ago.  6.4 on repeat during this hospitalization.  Glucose ranges been 100-191 past 24 hours. - Carb modified diet - Continue SSI   Right Subdural Hygroma: Neurosurgery has evaluated, no intervention at this time  # Tongue lesion Patient has a large white cratered lesion on the left side of his tongue. Does not appear to be bite wound. Patient will need to be seen by ENT as an outpatient for biopsy.  Best Practice: Diet: Diabetic diet IVF: Fluids: none, Rate: none VTE: SCDs Start: 10/30/20 2154 Code: Full AB: Unasyn   Signature: Delene Ruffini, MD  Internal Medicine Resident, PGY-1 [facility]  Pager: [phone] 11:47 AM, 11/07/2020   Please contact the on call pager after 5 pm and on weekends at [phone].

## 2020-11-08 ENCOUNTER — Inpatient Hospital Stay (HOSPITAL_COMMUNITY): Payer: Medicare Other

## 2020-11-08 DIAGNOSIS — I639 Cerebral infarction, unspecified: Secondary | ICD-10-CM | POA: Diagnosis not present

## 2020-11-08 DIAGNOSIS — N179 Acute kidney failure, unspecified: Secondary | ICD-10-CM | POA: Diagnosis not present

## 2020-11-08 DIAGNOSIS — M6282 Rhabdomyolysis: Secondary | ICD-10-CM | POA: Diagnosis not present

## 2020-11-08 DIAGNOSIS — G9340 Encephalopathy, unspecified: Secondary | ICD-10-CM | POA: Diagnosis not present

## 2020-11-08 LAB — BASIC METABOLIC PANEL
Anion gap: 11 (ref 5–15)
BUN: 22 mg/dL (ref 8–23)
CO2: 27 mmol/L (ref 22–32)
Calcium: 7.8 mg/dL — ABNORMAL LOW (ref 8.9–10.3)
Chloride: 102 mmol/L (ref 98–111)
Creatinine, Ser: 1.11 mg/dL (ref 0.61–1.24)
GFR, Estimated: >60 mL/min (ref >60)
Glucose, Bld: 91 mg/dL (ref 70–99)
Potassium: 3 mmol/L — ABNORMAL LOW (ref 3.5–5.1)
Sodium: 140 mmol/L (ref 135–145)

## 2020-11-08 LAB — CBC WITH DIFFERENTIAL/PLATELET
Abs Immature Granulocytes: 0.09 10*3/uL — ABNORMAL HIGH (ref 0.00–0.07)
Basophils Absolute: 0 10*3/uL (ref 0.0–0.1)
Basophils Relative: 0 %
Eosinophils Absolute: 0.2 10*3/uL (ref 0.0–0.5)
Eosinophils Relative: 1 %
HCT: 27.6 % — ABNORMAL LOW (ref 39.0–52.0)
Hemoglobin: 9.3 g/dL — ABNORMAL LOW (ref 13.0–17.0)
Immature Granulocytes: 1 %
Lymphocytes Relative: 11 %
Lymphs Abs: 1.5 10*3/uL (ref 0.7–4.0)
MCH: 35.1 pg — ABNORMAL HIGH (ref 26.0–34.0)
MCHC: 33.7 g/dL (ref 30.0–36.0)
MCV: 104.2 fL — ABNORMAL HIGH (ref 80.0–100.0)
Monocytes Absolute: 0.8 10*3/uL (ref 0.1–1.0)
Monocytes Relative: 6 %
Neutro Abs: 11.2 10*3/uL — ABNORMAL HIGH (ref 1.7–7.7)
Neutrophils Relative %: 81 %
Platelets: 143 10*3/uL — ABNORMAL LOW (ref 150–400)
RBC: 2.65 MIL/uL — ABNORMAL LOW (ref 4.22–5.81)
RDW: 14.8 % (ref 11.5–15.5)
WBC: 13.7 10*3/uL — ABNORMAL HIGH (ref 4.0–10.5)
nRBC: 0 % (ref 0.0–0.2)

## 2020-11-08 LAB — GLUCOSE, CAPILLARY
Glucose-Capillary: 85 mg/dL (ref 70–99)
Glucose-Capillary: 96 mg/dL (ref 70–99)
Glucose-Capillary: 78 mg/dL (ref 70–99)
Glucose-Capillary: 146 mg/dL — ABNORMAL HIGH (ref 70–99)

## 2020-11-08 LAB — D-DIMER, QUANTITATIVE: D-Dimer, Quant: 12.5 ug/mL-FEU — ABNORMAL HIGH (ref 0.00–0.50)

## 2020-11-08 LAB — TROPONIN I (HIGH SENSITIVITY): Troponin I (High Sensitivity): 224 ng/L (ref <18)

## 2020-11-08 MED ORDER — POTASSIUM CHLORIDE CRYS ER 20 MEQ PO TBCR
40.0000 meq | EXTENDED_RELEASE_TABLET | Freq: Two times a day (BID) | ORAL | Status: DC
  Administered 2020-11-08 (×2): 40 meq via ORAL
  Filled 2020-11-08: qty 2

## 2020-11-08 MED ORDER — METOPROLOL TARTRATE 5 MG/5ML IV SOLN
5.0000 mg | Freq: Once | INTRAVENOUS | Status: AC
  Administered 2020-11-08: 5 mg via INTRAVENOUS
  Filled 2020-11-08: qty 5

## 2020-11-08 MED ORDER — HYDROMORPHONE HCL 1 MG/ML IJ SOLN
1.0000 mg | Freq: Once | INTRAMUSCULAR | Status: AC
  Administered 2020-11-08: 1 mg via INTRAVENOUS
  Filled 2020-11-08: qty 1

## 2020-11-08 MED ORDER — IOHEXOL 350 MG/ML SOLN
75.0000 mL | Freq: Once | INTRAVENOUS | Status: AC | PRN
  Administered 2020-11-08: 75 mL via INTRAVENOUS

## 2020-11-08 MED ORDER — FUROSEMIDE 10 MG/ML IJ SOLN
80.0000 mg | Freq: Two times a day (BID) | INTRAMUSCULAR | Status: DC
  Administered 2020-11-08 – 2020-11-11 (×7): 80 mg via INTRAVENOUS
  Filled 2020-11-08 (×7): qty 8

## 2020-11-08 MED ORDER — FUROSEMIDE 10 MG/ML IJ SOLN
80.0000 mg | Freq: Once | INTRAMUSCULAR | Status: AC
  Administered 2020-11-08: 80 mg via INTRAVENOUS
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
## 2020-11-08 NOTE — Progress Notes (Signed)
Pt has a critical Lab troponin 224 MD paged [phone].  Paged [phone]

## 2020-11-08 NOTE — Progress Notes (Signed)
Paged by RN for HR in 160s. Upon evaluation, patient in significant pain with tachycardia although otherwise hemodynamically stable. Per RN, patient's HR spiked after he awakened from sleep and coincided with complaints of pain. He received metoprolol '50mg'$  BID and norvasc during the day. Patient notes nonspecific chest pain, but pain primarily in lower back (no midline spinal tenderness, likely postural) and left leg (fracture s/p external fixation of left ankle). Likely also component of anxiety playing role. Will obtain trops x2 and EKG to evaluate chest pain. Ordered '1mg'$  IV dilaudid for more optimal pain control. Discussed with RN to page if patient remains tachycardic or if becomes hemodynamically unstable.

## 2020-11-09 ENCOUNTER — Encounter (HOSPITAL_COMMUNITY): Payer: Self-pay | Admitting: Internal Medicine

## 2020-11-09 DIAGNOSIS — G9340 Encephalopathy, unspecified: Secondary | ICD-10-CM | POA: Diagnosis not present

## 2020-11-09 DIAGNOSIS — N179 Acute kidney failure, unspecified: Secondary | ICD-10-CM | POA: Diagnosis not present

## 2020-11-09 DIAGNOSIS — R778 Other specified abnormalities of plasma proteins: Secondary | ICD-10-CM | POA: Diagnosis not present

## 2020-11-09 DIAGNOSIS — I639 Cerebral infarction, unspecified: Secondary | ICD-10-CM | POA: Diagnosis not present

## 2020-11-09 LAB — CBC WITH DIFFERENTIAL/PLATELET
Abs Immature Granulocytes: 0.07 10*3/uL (ref 0.00–0.07)
Basophils Absolute: 0 10*3/uL (ref 0.0–0.1)
Basophils Relative: 0 %
Eosinophils Absolute: 0.2 10*3/uL (ref 0.0–0.5)
Eosinophils Relative: 1 %
HCT: 27.7 % — ABNORMAL LOW (ref 39.0–52.0)
Hemoglobin: 9.4 g/dL — ABNORMAL LOW (ref 13.0–17.0)
Immature Granulocytes: 1 %
Lymphocytes Relative: 13 %
Lymphs Abs: 1.8 10*3/uL (ref 0.7–4.0)
MCH: 35.3 pg — ABNORMAL HIGH (ref 26.0–34.0)
MCHC: 33.9 g/dL (ref 30.0–36.0)
MCV: 104.1 fL — ABNORMAL HIGH (ref 80.0–100.0)
Monocytes Absolute: 0.9 10*3/uL (ref 0.1–1.0)
Monocytes Relative: 7 %
Neutro Abs: 10.6 10*3/uL — ABNORMAL HIGH (ref 1.7–7.7)
Neutrophils Relative %: 78 %
Platelets: 165 10*3/uL (ref 150–400)
RBC: 2.66 MIL/uL — ABNORMAL LOW (ref 4.22–5.81)
RDW: 14.8 % (ref 11.5–15.5)
WBC: 13.6 10*3/uL — ABNORMAL HIGH (ref 4.0–10.5)
nRBC: 0 % (ref 0.0–0.2)

## 2020-11-09 LAB — COMPREHENSIVE METABOLIC PANEL
ALT: 14 U/L (ref 0–44)
AST: 19 U/L (ref 15–41)
Albumin: 2.3 g/dL — ABNORMAL LOW (ref 3.5–5.0)
Alkaline Phosphatase: 74 U/L (ref 38–126)
Anion gap: 10 (ref 5–15)
BUN: 18 mg/dL (ref 8–23)
CO2: 33 mmol/L — ABNORMAL HIGH (ref 22–32)
Calcium: 7.8 mg/dL — ABNORMAL LOW (ref 8.9–10.3)
Chloride: 98 mmol/L (ref 98–111)
Creatinine, Ser: 1.12 mg/dL (ref 0.61–1.24)
GFR, Estimated: >60 mL/min (ref >60)
Glucose, Bld: 105 mg/dL — ABNORMAL HIGH (ref 70–99)
Potassium: 2.7 mmol/L — CL (ref 3.5–5.1)
Sodium: 141 mmol/L (ref 135–145)
Total Bilirubin: 1.1 mg/dL (ref 0.3–1.2)
Total Protein: 4.9 g/dL — ABNORMAL LOW (ref 6.5–8.1)

## 2020-11-09 LAB — BASIC METABOLIC PANEL
Anion gap: 10 (ref 5–15)
BUN: 15 mg/dL (ref 8–23)
CO2: 32 mmol/L (ref 22–32)
Calcium: 7.6 mg/dL — ABNORMAL LOW (ref 8.9–10.3)
Chloride: 97 mmol/L — ABNORMAL LOW (ref 98–111)
Creatinine, Ser: 1.07 mg/dL (ref 0.61–1.24)
GFR, Estimated: >60 mL/min (ref >60)
Glucose, Bld: 109 mg/dL — ABNORMAL HIGH (ref 70–99)
Potassium: 3.2 mmol/L — ABNORMAL LOW (ref 3.5–5.1)
Sodium: 139 mmol/L (ref 135–145)

## 2020-11-09 LAB — GLUCOSE, CAPILLARY
Glucose-Capillary: 115 mg/dL — ABNORMAL HIGH (ref 70–99)
Glucose-Capillary: 109 mg/dL — ABNORMAL HIGH (ref 70–99)
Glucose-Capillary: 214 mg/dL — ABNORMAL HIGH (ref 70–99)
Glucose-Capillary: 117 mg/dL — ABNORMAL HIGH (ref 70–99)

## 2020-11-09 MED ORDER — HYDROMORPHONE HCL 1 MG/ML IJ SOLN
1.0000 mg | INTRAMUSCULAR | Status: AC
  Administered 2020-11-09: 1 mg via INTRAVENOUS
  Filled 2020-11-09: qty 1

## 2020-11-09 MED ORDER — POTASSIUM CHLORIDE 10 MEQ/100ML IV SOLN
10.0000 meq | INTRAVENOUS | Status: AC
  Administered 2020-11-09 (×5): 10 meq via INTRAVENOUS
  Filled 2020-11-09 (×2): qty 100

## 2020-11-09 MED ORDER — POTASSIUM CHLORIDE CRYS ER 20 MEQ PO TBCR
40.0000 meq | EXTENDED_RELEASE_TABLET | Freq: Two times a day (BID) | ORAL | Status: AC
  Administered 2020-11-09 (×2): 40 meq via ORAL
  Filled 2020-11-09 (×2): qty 2

## 2020-11-09 NOTE — Hospital Course (Addendum)
Labored breathing In pain  Neck still tight/painful, no relief w muscle relaxer  U/S lungs   7/23: increasing resp requirement. Negative pe by ct. D dimer elevation due to orthopedic surgery. BNP elevated. Started on diuretics.   7/26: breathing improved to 3L  Given toradol for leg pain   Lasik Tongue lesion Urology for foley cath and vodiing trials

## 2020-11-09 NOTE — Progress Notes (Signed)
Patient potassium level down 2.7 this morning.MD notified awaiting orders.

## 2020-11-09 NOTE — Progress Notes (Signed)
PT Cancellation Note  Patient Details Name: Austin Andrade MRN: [id number] DOB: September 21, 1950   Cancelled Treatment:    Reason Eval/Treat Not Completed: Patient declined treatment due to feeling too short of breath (pt on 5L O2). He is very upset over continued illness and requested to defer PT at this time.    Arby Barrette, PT Pager [phone]    Rexanne Mano 11/09/2020, 2:53 PM

## 2020-11-09 NOTE — TOC Progression Note (Signed)
Transition of Care [facility]) - Progression Note    Patient Details  Name: Austin Andrade MRN: [id number] Date of Birth: 10-10-1950  Transition of Care [facility]) CM/SW [place], [place] Phone Number: 11/09/2020, 1:58 PM  Clinical Narrative:     SNF Auth is approved 11/09/2020-11/11/2020 [id number] Ref# [id number]  Expected Discharge Plan: Skilled Nursing Facility Barriers to Discharge: Continued Medical Work up, SNF Pending bed offer  Expected Discharge Plan and Services Expected Discharge Plan: [place] In-house Referral: Clinical Social Work   Post Acute Care Choice: [place] Living arrangements for the past 2 months: Single Family Home                                       Social Determinants of Health (SDOH) Interventions    Readmission Risk Interventions No flowsheet data found.

## 2020-11-09 NOTE — Evaluation (Addendum)
K 2.7, ordered kdur 40 po now, and IV K 10 mEq 1 hr x 5 to increase K to more appropriate level. Will check BMP in 6 hours to reevaluate.
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
## 2020-11-10 ENCOUNTER — Inpatient Hospital Stay (HOSPITAL_COMMUNITY): Payer: Medicare Other

## 2020-11-10 DIAGNOSIS — I509 Heart failure, unspecified: Secondary | ICD-10-CM

## 2020-11-10 DIAGNOSIS — N179 Acute kidney failure, unspecified: Secondary | ICD-10-CM | POA: Diagnosis not present

## 2020-11-10 LAB — CBC WITH DIFFERENTIAL/PLATELET
Abs Immature Granulocytes: 0.1 10*3/uL — ABNORMAL HIGH (ref 0.00–0.07)
Basophils Absolute: 0 10*3/uL (ref 0.0–0.1)
Basophils Relative: 0 %
Eosinophils Absolute: 0.3 10*3/uL (ref 0.0–0.5)
Eosinophils Relative: 2 %
HCT: 28.9 % — ABNORMAL LOW (ref 39.0–52.0)
Hemoglobin: 9.9 g/dL — ABNORMAL LOW (ref 13.0–17.0)
Immature Granulocytes: 1 %
Lymphocytes Relative: 15 %
Lymphs Abs: 2.4 10*3/uL (ref 0.7–4.0)
MCH: 35.5 pg — ABNORMAL HIGH (ref 26.0–34.0)
MCHC: 34.3 g/dL (ref 30.0–36.0)
MCV: 103.6 fL — ABNORMAL HIGH (ref 80.0–100.0)
Monocytes Absolute: 0.9 10*3/uL (ref 0.1–1.0)
Monocytes Relative: 6 %
Neutro Abs: 11.9 10*3/uL — ABNORMAL HIGH (ref 1.7–7.7)
Neutrophils Relative %: 76 %
Platelets: 181 10*3/uL (ref 150–400)
RBC: 2.79 MIL/uL — ABNORMAL LOW (ref 4.22–5.81)
RDW: 14.8 % (ref 11.5–15.5)
WBC: 15.6 10*3/uL — ABNORMAL HIGH (ref 4.0–10.5)
nRBC: 0 % (ref 0.0–0.2)

## 2020-11-10 LAB — BASIC METABOLIC PANEL
Anion gap: 9 (ref 5–15)
BUN: 17 mg/dL (ref 8–23)
CO2: 33 mmol/L — ABNORMAL HIGH (ref 22–32)
Calcium: 7.7 mg/dL — ABNORMAL LOW (ref 8.9–10.3)
Chloride: 99 mmol/L (ref 98–111)
Creatinine, Ser: 1.07 mg/dL (ref 0.61–1.24)
GFR, Estimated: >60 mL/min (ref >60)
Glucose, Bld: 114 mg/dL — ABNORMAL HIGH (ref 70–99)
Potassium: 3.3 mmol/L — ABNORMAL LOW (ref 3.5–5.1)
Sodium: 141 mmol/L (ref 135–145)

## 2020-11-10 LAB — COMPREHENSIVE METABOLIC PANEL
ALT: 12 U/L (ref 0–44)
AST: 17 U/L (ref 15–41)
Albumin: 2.3 g/dL — ABNORMAL LOW (ref 3.5–5.0)
Alkaline Phosphatase: 71 U/L (ref 38–126)
Anion gap: 6 (ref 5–15)
BUN: 18 mg/dL (ref 8–23)
CO2: 35 mmol/L — ABNORMAL HIGH (ref 22–32)
Calcium: 7.5 mg/dL — ABNORMAL LOW (ref 8.9–10.3)
Chloride: 100 mmol/L (ref 98–111)
Creatinine, Ser: 1.05 mg/dL (ref 0.61–1.24)
GFR, Estimated: >60 mL/min (ref >60)
Glucose, Bld: 130 mg/dL — ABNORMAL HIGH (ref 70–99)
Potassium: 2.9 mmol/L — ABNORMAL LOW (ref 3.5–5.1)
Sodium: 141 mmol/L (ref 135–145)
Total Bilirubin: 1 mg/dL (ref 0.3–1.2)
Total Protein: 4.9 g/dL — ABNORMAL LOW (ref 6.5–8.1)

## 2020-11-10 LAB — GLUCOSE, CAPILLARY
Glucose-Capillary: 133 mg/dL — ABNORMAL HIGH (ref 70–99)
Glucose-Capillary: 118 mg/dL — ABNORMAL HIGH (ref 70–99)
Glucose-Capillary: 214 mg/dL — ABNORMAL HIGH (ref 70–99)
Glucose-Capillary: 126 mg/dL — ABNORMAL HIGH (ref 70–99)

## 2020-11-10 LAB — MAGNESIUM
Magnesium: 1.2 mg/dL — ABNORMAL LOW (ref 1.7–2.4)
Magnesium: 2.4 mg/dL (ref 1.7–2.4)

## 2020-11-10 MED ORDER — KETOROLAC TROMETHAMINE 15 MG/ML IJ SOLN
15.0000 mg | Freq: Once | INTRAMUSCULAR | Status: AC
  Administered 2020-11-10: 15 mg via INTRAVENOUS
  Filled 2020-11-10: qty 1

## 2020-11-10 MED ORDER — POTASSIUM CHLORIDE CRYS ER 20 MEQ PO TBCR
40.0000 meq | EXTENDED_RELEASE_TABLET | Freq: Two times a day (BID) | ORAL | Status: AC
  Administered 2020-11-10 (×2): 40 meq via ORAL
  Filled 2020-11-10 (×2): qty 2

## 2020-11-10 MED ORDER — BACLOFEN 10 MG PO TABS
5.0000 mg | ORAL_TABLET | Freq: Three times a day (TID) | ORAL | Status: DC | PRN
  Administered 2020-11-10 – 2020-11-11 (×4): 5 mg via ORAL
  Filled 2020-11-10 (×4): qty 1

## 2020-11-10 MED ORDER — APIXABAN 5 MG PO TABS
5.0000 mg | ORAL_TABLET | Freq: Two times a day (BID) | ORAL | Status: DC
  Administered 2020-11-10 – 2020-11-11 (×3): 5 mg via ORAL
  Filled 2020-11-10 (×3): qty 1

## 2020-11-10 MED ORDER — MAGNESIUM SULFATE 4 GM/100ML IV SOLN
4.0000 g | Freq: Once | INTRAVENOUS | Status: AC
  Administered 2020-11-10: 4 g via INTRAVENOUS
  Filled 2020-11-10: qty 100

## 2020-11-10 NOTE — Progress Notes (Signed)
On reviewing the patient's medications, patient has a history of atrial fibrillation with a CHA2DS2-VASc score of 6 and a HAS BLED of 3.  Patient did have an MRI on this admission showing a subdural hygroma with no evidence of hematoma.  He is recently had left ankle surgery for comminuted fracture 11 days ago.  There are no obvious signs of bleeding currently and his hemoglobin has remained stable over the past week.  Considering the patient's history of atrial fibrillation with previous strokes, he is at high risk for repeat CVA.  Additionally, patient is bedbound with limited movement increasing his risk for VTE.  Therefore, I believe is important to restart the patient's anticoagulation at this time.  - will restart anticoagulation today with Eliquis   Lawerance Cruel, D.O.  Internal Medicine Resident, PGY-3 [facility]  Pager: [phone] 6:19 PM, 11/10/2020   **Please contact the on call pager after 5 pm and on weekends at [phone].**
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
## 2020-11-10 NOTE — Progress Notes (Signed)
Paged by nurse that patient was having 7/10 sharp pain related to surgery/pins in left lower extremity. Given '15mg'$  toradol one time dose.

## 2020-11-10 NOTE — Discharge Instructions (Addendum)
Dear Austin Andrade,  You were admitted for an tibia and fibula fracture acute kidney injury, and aspiration pneumonia. Orthopedic surgery performed an external fixation on your broken leg. You were given IV fluids with improvement in your renal function. Your pneumonia was treated with a course of the antibiotic Unasyn. During your hospital stay, you also began to require supplemental oxygen. Your labored breathing was determined to be related to congestive heart failure, so we gave you diuretic Lasix to help remove some of the excess fluid that had accumulated in your lungs.  Your Plavix was changed to Eliquis '5mg'$ , please ensure that you take this each day.  Please follow up with an Ear, Nose, and throat specialist for the lesion on your tongue.  Please Follow up with a urologist for the foley cath and voiding trials to evaluate your urinary retention. Please follow up with your primary care physician.    Information on my medicine - ELIQUIS (apixaban)  Why was Eliquis prescribed for you? Eliquis was prescribed for you to reduce the risk of a blood clot forming that can cause a stroke if you have a medical condition called atrial fibrillation (a type of irregular heartbeat).  What do You need to know about Eliquis ? Take your Eliquis TWICE DAILY - one tablet in the morning and one tablet in the evening with or without food. If you have difficulty swallowing the tablet whole please discuss with your pharmacist how to take the medication safely.  Take Eliquis exactly as prescribed by your doctor and DO NOT stop taking Eliquis without talking to the doctor who prescribed the medication.  Stopping may increase your risk of developing a stroke.  Refill your prescription before you run out.  After discharge, you should have regular check-up appointments with your healthcare provider that is prescribing your Eliquis.  In the future your dose may need to be changed if your kidney function or weight  changes by a significant amount or as you get older.  What do you do if you miss a dose? If you miss a dose, take it as soon as you remember on the same day and resume taking twice daily.  Do not take more than one dose of ELIQUIS at the same time to make up a missed dose.  Important Safety Information A possible side effect of Eliquis is bleeding. You should call your healthcare provider right away if you experience any of the following: Bleeding from an injury or your nose that does not stop. Unusual colored urine (red or dark brown) or unusual colored stools (red or black). Unusual bruising for unknown reasons. A serious fall or if you hit your head (even if there is no bleeding).  Some medicines may interact with Eliquis and might increase your risk of bleeding or clotting while on Eliquis. To help avoid this, consult your healthcare provider or pharmacist prior to using any new prescription or non-prescription medications, including herbals, vitamins, non-steroidal anti-inflammatory drugs (NSAIDs) and supplements.  This website has more information on Eliquis (apixaban): http://www.eliquis.com/eliquis/home

## 2020-11-10 NOTE — Progress Notes (Signed)
Physical Therapy Treatment Patient Details Name: Austin Andrade MRN: [id number] DOB: [birth year] Today's Date: 11/10/2020    History of Present Illness 70 y.o. male presenting to ED 10/30/20 after falling down a flight of stairs. Patient found to have acute A-fib with RVR, AKI, rhabdo, CAP vs aspiration pneumonia, SDH, hepatocellular injury, metabolic acidosis and acute toxic metabolic encephalopathy. MRI (+) multifocal acute nonhemorrhagic infarcts in R superior cerebellum, posterior L temporal and parietal lobes, anterior R frontal cortex, R parietal white matter, and R occipital lobe. X ray (+) open L distal tib/fib fx s/p external fixation on 10/30/20 [facility] post op. PMHx significant for ischemic vascular disease and alcohol use disorder.    PT Comments    Patient initially very upset with hospital care in general. After he was allowed to express his feelings, he was agreeable to work with PT. Assisted to EOB and as he performed LE exercises he became more and more fatigued with posterior lean noted. Patient also with difficulty holding his head up with it fully flexed with chin against his chest. Patient did not want to try to get OOB and assisted back to bed.     Follow Up Recommendations  SNF     Equipment Recommendations  Wheelchair (measurements PT);Other (comment);3in1 (PT);Hospital bed (drop arm 3-in-1)    Recommendations for Other Services       Precautions / Restrictions Precautions Precautions: Fall Required Braces or Orthoses: Other Brace Other Brace: LLE external fixation, R PREVALON boot Restrictions LLE Weight Bearing: Non weight bearing    Mobility  Bed Mobility Overal bed mobility: Needs Assistance Bed Mobility: Supine to Sit;Sit to Supine     Supine to sit: Min assist;HOB elevated;+2 for safety/equipment Sit to supine: Mod assist;+2 for physical assistance   General bed mobility comments: assist to manage LLE with external fixator and to raise torso     Transfers                 General transfer comment: pt did not want to attempt to stand "too tired"  Ambulation/Gait             General Gait Details: unable at this time.   Stairs             Wheelchair Mobility    Modified Rankin (Stroke Patients Only)       Balance Overall balance assessment: Needs assistance Sitting-balance support: Feet supported;Bilateral upper extremity supported Sitting balance-Leahy Scale: Fair Sitting balance - Comments: close supervision EOB, pt report significant neck pain in sitting, chin is almost to chest and pt with difficulty holding it up in neutral extension, also left side bend preference.  Once situated in recliner, neck roll and heat pads applied to help support neck an decrease pain. Postural control: Posterior lean                                  Cognition Arousal/Alertness: Awake/alert Behavior During Therapy:  (frustrated) Overall Cognitive Status: Within Functional Limits for tasks assessed                                 General Comments: Not specifically tested, conversation normal, pt seems a bit down.      Exercises General Exercises - Lower Extremity Quad Sets: AROM;Left;10 reps;Seated Long Arc Quad: AROM;Both;Other reps (comment) (Rt 40 reps; Lt 30 reps)  General Comments        Pertinent Vitals/Pain Pain Assessment: Faces Faces Pain Scale: Hurts even more Pain Location: neck and left lower leg Pain Descriptors / Indicators: Grimacing;Guarding Pain Intervention(s): Limited activity within patient's tolerance;Monitored during session    Home Living                      Prior Function            PT Goals (current goals can now be found in the care plan section) Acute Rehab PT Goals Patient Stated Goal: Decrease pain Time For Goal Achievement: 11/18/20 Potential to Achieve Goals: Good Progress towards PT goals: Progressing toward goals     Frequency    Min 3X/week      PT Plan Current plan remains appropriate    Co-evaluation              AM-PAC PT "6 Clicks" Mobility   Outcome Measure  Help needed turning from your back to your side while in a flat bed without using bedrails?: A Lot Help needed moving from lying on your back to sitting on the side of a flat bed without using bedrails?: Total Help needed moving to and from a bed to a chair (including a wheelchair)?: Total Help needed standing up from a chair using your arms (e.g., wheelchair or bedside chair)?: Total Help needed to walk in hospital room?: Total Help needed climbing 3-5 steps with a railing? : Total 6 Click Score: 7    End of Session Equipment Utilized During Treatment: Oxygen Activity Tolerance: Patient limited by fatigue Patient left: in bed;with call bell/phone within reach;with bed alarm set Nurse Communication: Mobility status PT Visit Diagnosis: Other abnormalities of gait and mobility (R26.89);Muscle weakness (generalized) (M62.81);Unsteadiness on feet (R26.81) Pain - Right/Left: Left Pain - part of body: Leg (and neck)     Time: [id number] PT Time Calculation (min) (ACUTE ONLY): 22 min  Charges:  $Therapeutic Activity: 8-22 mins                      Arby Barrette, PT Pager [phone]    Rexanne Mano 11/10/2020, 2:42 PM

## 2020-11-11 ENCOUNTER — Other Ambulatory Visit (HOSPITAL_COMMUNITY): Payer: Self-pay

## 2020-11-11 DIAGNOSIS — J9601 Acute respiratory failure with hypoxia: Secondary | ICD-10-CM | POA: Diagnosis not present

## 2020-11-11 DIAGNOSIS — I639 Cerebral infarction, unspecified: Secondary | ICD-10-CM | POA: Diagnosis not present

## 2020-11-11 LAB — BASIC METABOLIC PANEL
Anion gap: 8 (ref 5–15)
BUN: 19 mg/dL (ref 8–23)
CO2: 31 mmol/L (ref 22–32)
Calcium: 8 mg/dL — ABNORMAL LOW (ref 8.9–10.3)
Chloride: 99 mmol/L (ref 98–111)
Creatinine, Ser: 1.09 mg/dL (ref 0.61–1.24)
GFR, Estimated: >60 mL/min (ref >60)
Glucose, Bld: 98 mg/dL (ref 70–99)
Potassium: 3.4 mmol/L — ABNORMAL LOW (ref 3.5–5.1)
Sodium: 138 mmol/L (ref 135–145)

## 2020-11-11 LAB — CBC WITH DIFFERENTIAL/PLATELET
Abs Immature Granulocytes: 0.09 10*3/uL — ABNORMAL HIGH (ref 0.00–0.07)
Basophils Absolute: 0.1 10*3/uL (ref 0.0–0.1)
Basophils Relative: 1 %
Eosinophils Absolute: 0.3 10*3/uL (ref 0.0–0.5)
Eosinophils Relative: 2 %
HCT: 32.1 % — ABNORMAL LOW (ref 39.0–52.0)
Hemoglobin: 10.9 g/dL — ABNORMAL LOW (ref 13.0–17.0)
Immature Granulocytes: 1 %
Lymphocytes Relative: 22 %
Lymphs Abs: 2.9 10*3/uL (ref 0.7–4.0)
MCH: 35.6 pg — ABNORMAL HIGH (ref 26.0–34.0)
MCHC: 34 g/dL (ref 30.0–36.0)
MCV: 104.9 fL — ABNORMAL HIGH (ref 80.0–100.0)
Monocytes Absolute: 0.6 10*3/uL (ref 0.1–1.0)
Monocytes Relative: 5 %
Neutro Abs: 9.3 10*3/uL — ABNORMAL HIGH (ref 1.7–7.7)
Neutrophils Relative %: 69 %
Platelets: 186 10*3/uL (ref 150–400)
RBC: 3.06 MIL/uL — ABNORMAL LOW (ref 4.22–5.81)
RDW: 14.9 % (ref 11.5–15.5)
WBC: 13.3 10*3/uL — ABNORMAL HIGH (ref 4.0–10.5)
nRBC: 0 % (ref 0.0–0.2)

## 2020-11-11 LAB — PHOSPHORUS: Phosphorus: 3.2 mg/dL (ref 2.5–4.6)

## 2020-11-11 LAB — RESP PANEL BY RT-PCR (FLU A&B, COVID) ARPGX2
Influenza A by PCR: NEGATIVE
Influenza B by PCR: NEGATIVE
SARS Coronavirus 2 by RT PCR: NEGATIVE

## 2020-11-11 LAB — GLUCOSE, CAPILLARY
Glucose-Capillary: 93 mg/dL (ref 70–99)
Glucose-Capillary: 248 mg/dL — ABNORMAL HIGH (ref 70–99)
Glucose-Capillary: 121 mg/dL — ABNORMAL HIGH (ref 70–99)
Glucose-Capillary: 161 mg/dL — ABNORMAL HIGH (ref 70–99)

## 2020-11-11 LAB — MAGNESIUM: Magnesium: 1.9 mg/dL (ref 1.7–2.4)

## 2020-11-11 MED ORDER — PRAVASTATIN SODIUM 80 MG PO TABS
80.0000 mg | ORAL_TABLET | Freq: Every day | ORAL | 0 refills | Status: DC
  Filled 2020-11-11: qty 30, 30d supply, fill #0

## 2020-11-11 MED ORDER — APIXABAN 5 MG PO TABS: 5.0000 mg | ORAL_TABLET | Freq: Two times a day (BID) | ORAL | 0 refills | Status: DC

## 2020-11-11 MED ORDER — KETOROLAC TROMETHAMINE 15 MG/ML IJ SOLN
15.0000 mg | Freq: Four times a day (QID) | INTRAMUSCULAR | Status: DC | PRN
  Administered 2020-11-11 (×3): 15 mg via INTRAVENOUS
  Filled 2020-11-11 (×3): qty 1

## 2020-11-11 MED ORDER — APIXABAN 5 MG PO TABS
5.0000 mg | ORAL_TABLET | Freq: Two times a day (BID) | ORAL | 0 refills | Status: DC
  Filled 2020-11-11: qty 60, 30d supply, fill #0

## 2020-11-11 MED ORDER — TAMSULOSIN HCL 0.4 MG PO CAPS: 0.4000 mg | ORAL_CAPSULE | Freq: Every day | ORAL | 0 refills | Status: AC

## 2020-11-11 MED ORDER — AMLODIPINE BESYLATE 5 MG PO TABS
5.0000 mg | ORAL_TABLET | Freq: Every day | ORAL | 0 refills | Status: DC
  Filled 2020-11-11: qty 30, 30d supply, fill #0

## 2020-11-11 MED ORDER — METOPROLOL TARTRATE 50 MG PO TABS
50.0000 mg | ORAL_TABLET | Freq: Two times a day (BID) | ORAL | 0 refills | Status: DC
  Filled 2020-11-11: qty 60, 30d supply, fill #0

## 2020-11-11 MED ORDER — BACLOFEN 5 MG PO TABS: 5.0000 mg | ORAL_TABLET | Freq: Three times a day (TID) | ORAL | 0 refills | Status: AC | PRN

## 2020-11-11 MED ORDER — AMLODIPINE BESYLATE 5 MG PO TABS: 5.0000 mg | ORAL_TABLET | Freq: Every day | ORAL | 0 refills | Status: DC

## 2020-11-11 MED ORDER — BACLOFEN 5 MG PO TABS
5.0000 mg | ORAL_TABLET | Freq: Three times a day (TID) | ORAL | 0 refills | Status: DC | PRN
  Filled 2020-11-11: qty 30, 10d supply, fill #0

## 2020-11-11 MED ORDER — METOPROLOL TARTRATE 50 MG PO TABS: 50.0000 mg | ORAL_TABLET | Freq: Two times a day (BID) | ORAL | 0 refills | Status: AC

## 2020-11-11 MED ORDER — GABAPENTIN 600 MG PO TABS: 1200.0000 mg | ORAL_TABLET | Freq: Three times a day (TID) | ORAL | Status: DC

## 2020-11-11 MED ORDER — PRAVASTATIN SODIUM 80 MG PO TABS: 80.0000 mg | ORAL_TABLET | Freq: Every day | ORAL | 0 refills | Status: DC

## 2020-11-11 MED ORDER — TAMSULOSIN HCL 0.4 MG PO CAPS
0.4000 mg | ORAL_CAPSULE | Freq: Every day | ORAL | 0 refills | Status: DC
  Filled 2020-11-11: qty 30, 30d supply, fill #0

## 2020-11-11 MED ORDER — NAPROXEN 500 MG PO TABS: 500.0000 mg | ORAL_TABLET | Freq: Two times a day (BID) | ORAL | 0 refills | Status: AC

## 2020-11-11 MED ORDER — NAPROXEN 500 MG PO TABS
500.0000 mg | ORAL_TABLET | Freq: Two times a day (BID) | ORAL | 0 refills | Status: DC
  Filled 2020-11-11: qty 28, 14d supply, fill #0

## 2020-11-11 NOTE — TOC Transition Note (Signed)
Transition of Care [facility]) - CM/SW Discharge Note   Patient Details  Name: Alyjah Hirschman MRN: [id number] Date of Birth: 1950/11/12  [facility] CM/SW Contact:  Bethann Berkshire, [place] Phone Number: 11/11/2020, 3:26 PM   Clinical Narrative:     Patient will DC to: [facility] Anticipated DC date:  11/11/20 Transport by: Corey Harold   Per MD patient ready for DC to [facility]. RN, patient, and facility notified of DC. Discharge Summary and FL2 sent to facility. RN to call report prior to discharge ([phone]). DC packet on chart. Ambulance transport requested for patient.   CSW will sign off for now as social work intervention is no longer needed. Please consult [place] again if new needs arise.     Barriers to Discharge: No Barriers Identified   Patient Goals and CMS Choice Patient states their goals for this hospitalization and ongoing recovery are:: none stated CMS Medicare.gov Compare Post Acute Care list provided to:: Patient Choice offered to / list presented to : Patient  Discharge Placement              Patient chooses bed at:  ([facility]) Patient to be transferred to facility by: [facility]   Patient and family notified of of transfer: 11/11/20  Discharge Plan and Services In-house Referral: Clinical Social Work   Post Acute Care Choice: [place]                               Social Determinants of Health (SDOH) Interventions     Readmission Risk Interventions No flowsheet data found.

## 2020-11-11 NOTE — Discharge Summary (Addendum)
Name: Austin Andrade MRN: [id number] DOB: [birth year] 70 y.o. PCP: Sandi Mariscal, MD  Date of Admission: 10/30/2020  6:00 AM Date of Discharge: 11/11/20 Attending Physician: Lucious Groves, DO  Discharge Diagnosis: 1. Acute hypoxic respiratory failure 2/2 Aspiration PNA and acute CHF Decompensation Acute multifocal CVA Hypokalemia Chronic neck pain PAF Distal left sided tibia/fibular fracture DM AKI 2/2 rhabdo  Discharge Medications: Allergies as of 11/11/2020   No Known Allergies      Medication List     STOP taking these medications    clopidogrel 75 MG tablet Commonly known as: PLAVIX   oxyCODONE 15 MG immediate release tablet Commonly known as: ROXICODONE       TAKE these medications    amLODipine 5 MG tablet Commonly known as: NORVASC Take 1 tablet (5 mg total) by mouth daily. Start taking on: November 12, 2020   apixaban 5 MG Tabs tablet Commonly known as: ELIQUIS Take 1 tablet (5 mg total) by mouth 2 (two) times daily.   Baclofen 5 MG Tabs Take 5 mg by mouth 3 (three) times daily as needed for up to 10 days (neck and back muscle pain).   gabapentin 600 MG tablet Commonly known as: NEURONTIN Take 2 tablets (1,200 mg total) by mouth 3 (three) times daily.   metoprolol tartrate 50 MG tablet Commonly known as: LOPRESSOR Take 1 tablet (50 mg total) by mouth 2 (two) times daily.   naproxen 500 MG tablet Commonly known as: Naprosyn Take 1 tablet (500 mg total) by mouth 2 (two) times daily with a meal for 14 days.   pravastatin 80 MG tablet Commonly known as: PRAVACHOL Take 1 tablet (80 mg total) by mouth daily. Start taking on: November 12, 2020 What changed:  how much to take how to take this when to take this additional instructions   tamsulosin 0.4 MG Caps capsule Commonly known as: FLOMAX Take 1 capsule (0.4 mg total) by mouth daily. Start taking on: November 12, 2020               Durable Medical Equipment  (From admission, onward)            Start     Ordered   11/11/20 [id number]  DME 3-in-1  Once        11/11/20 0713   11/11/20 0000  For home use only DME Hospital bed       Question Answer Comment  Length of Need 6 Months   Bed type Semi-electric      11/11/20 0713   11/06/20 1502  For home use only DME standard manual wheelchair with seat cushion  Once       Comments: Patient suffers from ankle fracture s/p ORIF which impairs their ability to perform daily activities like dressing in the home.  A walker will not resolve issue with performing activities of daily living. A wheelchair will allow patient to safely perform daily activities. Patient can safely propel the wheelchair in the home or has a caregiver who can provide assistance. Length of need 2 months . Accessories: elevating leg rests (ELRs), wheel locks, extensions and anti-tippers.   11/06/20 1525              Discharge Care Instructions  (From admission, onward)           Start     Ordered   11/11/20 0000  Leave dressing on - Keep it clean, dry, and intact until clinic visit  11/11/20 0713            Disposition and follow-up:   [name] was discharged from [facility] in Stable condition.  At the hospital follow up visit please address:  1.  Acute hypoxic respiratory failure 2/2 acute CHF decompensation  Patient had good diuresis during his hospitalization with IV Lasix '80mg'$  for volume status and is euvolemic on discharge. He is no longer requiring supplemental oxygen, satting well >95% on RA, showing any increased respiratory effort.    - please reevaluate volume status at PCP f/u   Acute CVA -found to have multifocal CVA on MRI, this was arributed to Atrial Fibrillation, neurology recommended change from Plavix as outpatient to Anticoagulation.  Hypokalemia Potassium repleted and stable at 3.4 on day of discharge.  Magnesium was 2.4 and does not need to be replenished - follow up with BMP    Chronic neck  pain Acute pain secondary to ex-fix  Patient continues to have chronic neck pain and tightness that has improved with muscle relaxers and Tylenol. Patient also has an acute pain component related to his ex-fix for his tibia and fibula fracture.  -Continue baclofen 5 mg 3 times daily as needed - naproxen '500mg'$  14 day supply acute pain    Paroxysmal atrial fibrillation CHADsVASc2 5, HASBLED 3.  Remains rate controlled. -Metoprolol 50 mg twice daily - his plavix has been switched to Eliquis '5mg'$  daily   Distal left sided tibia/fibular fracture Patient is postop irrigation and debridement of left ankle with external fixation.  Orthopedics plan on further surgery in the near future.  PT recommends SNF placement after discharge. - Orthopedics recommendations appreciated  Tongue lesion Patient was noted to have a cratered lesion with a white-ish appearance on the left side of his tongue suspicious for malignancy. He will need to follow up with and ENT specialist as an outpatient for further workup of this lesion.   Urinary retention Patient was noted to have urinary retention throughout his hospitalization requiring Tamsulosin 0.'4mg'$  and placement of foley cath. He will need to be discharged with catheter in place and re-evaluated with voiding trials. He will also need to follow up with a urologist as an outpatient.  2.  Labs / imaging needed at time of follow-up: CBC, CMP, mag, phos  3.  Pending labs/ test needing follow-up: none  Follow-up Appointments:  Contact information for follow-up providers     Altamese East Gaffney, MD. Call in 1 week(s).   Specialty: Orthopedic Surgery Why: please call to make a follow up appointment within 1-2 weeks. Contact information: [place] [address] [phone]         Sandi Mariscal, MD. Call in 1 week(s).   Specialty: Internal Medicine Why: please call to make a follow up appointment within 1 to 2 weeks Contact information: [place] [address] [phone]              Contact information for after-discharge care     Destination     [facility] .   Service: Skilled Nursing Contact information: [place] Hospital Course by problem list: 1. Acute hypoxic respiratory failure 2/2 acute CHF decompensation - Patient was admitted for with aspiratoion pneumonia. He was treated with a course of Unasyn with improvement of his respiratory symptoms, however, midway  through his hospitalization, he began to develop increasing oxygen requirement with shortness of breath. Procal and BNP were ordered and chest CT were ordered to r/o PE in the setting  of recent orthopedic surgery and immobilization. Procal returned wnl and BNP was noted to be elevated. It is thought that recent unasyn administration likely precipitated CHF due to high salt content. He was started on diuretics with good urine output and improvement in his symptoms. He returned to baseline with no supplemental oxygen necessary and saturation well. Lasix discontinued with planned f/u with pcp.  2. Acute kidney injury - Patient presented with AKI. He was noted to have elevated CK over 300 in the setting of recent fall and immobility. AKI thought to be related to rhabdomyolysis. He was started on IV fluids. He was monitored with daily BMPs with slow improvement in his renal function. BMP returned to wnl by time of discharge.   3. Cerebral Infarction - Patient was evaluated for cerebral infarction. PT, OT and SLP saw and evaluated the patient. TEE was also performed   and returned negative for LA/LAA thrombus, and negative bubble study   Chronic neck pain - Patient presented initially with torticollis. He was evaluated and felt tot have muscle spasm. He was treated with muscle relaxers, lidocaine patch, and tylenol.    Distal left  sided tibia/fibular fracture -  Patient presented with tib fib fracture. Orthopedic surgery performed an ex-fix on his wound. PT OT evaluated the patient and felt patient wound benefit from SNF. Plavix was initially held due to surgery. Given naproxen for pain. He was started on aspirin afterwards and transitioned to Eliquis for discharge.   Tongue lesion - patient was noted to have a tongue lesion. Wound care was consulted who did not feel inpatient wound care woud be appropriate. It was recommended he follow up with ENT outpatient.  Discharge Exam:   BP (!) 93/49 (BP Location: Left Arm)   Pulse 72   Temp 98.6 F (37 C)   Resp 19   Ht '5\' 8"'$  (1.727 m)   Wt 62 kg   SpO2 97%   BMI 20.78 kg/m  Discharge exam:  Physical Exam HENT:     Head: Normocephalic and atraumatic.     Mouth/Throat:     Mouth: Mucous membranes are moist.     Comments: Left sided cratered lesion tongue Cardiovascular:     Rate and Rhythm: Normal rate and regular rhythm.  Pulmonary:     Effort: Pulmonary effort is normal.     Breath sounds: Normal breath sounds.  Abdominal:     General: Abdomen is flat. Bowel sounds are normal.     Palpations: Abdomen is soft.  Genitourinary:    Comments: Foley cath in place Musculoskeletal:        General: Signs of injury present.     Cervical back: Normal range of motion and neck supple.     Comments: Ex fix LLE  Skin:    General: Skin is warm and dry.  Neurological:     General: No focal deficit present.     Mental Status: He is alert and oriented to person, place, and time.  Psychiatric:        Behavior: Behavior normal.        Thought Content: Thought content normal.        Judgment: Judgment normal.     Pertinent Labs, Studies, and Procedures:  CT ankle 1. Acute highly comminuted fractures of the distal tibia and fibula  as described above. No involvement of the tibial plafond and. 2. Tiny avulsion fractures of the lateral talar process, first metatarsal  base, and medial cuneiform. Acute nondisplaced fractures through the second and third metatarsal bases. 3. Lisfranc alignment is grossly maintained.  DG ankle Redemonstration of highly comminuted distal tibia and distal fibular fracture fragments with external fixation device in place but partially imaged. No significant interval change in position of the fracture fragments. Decreased soft tissue swelling. Perhaps early periosteal reaction along the lateral tibial fracture site.  CT angio chest 1. No evidence of pulmonary embolism. 2. Multifocal pneumonia superimposed on advanced emphysematous lung disease. Moderate bilateral pleural effusions, left greater than right causing bilateral lower lobe atelectasis. 3. Coronary atherosclerosis.  DG chest Worsening patchy opacities throughout both lungs, most prominent in the upper lungs, compatible with multilobar pneumonia. New small dependent bilateral pleural effusions.  MR brain 1. Multifocal acute nonhemorrhagic infarcts. Probable central source. 2. 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 3. Acute nonhemorrhagic infarction involving the right superior cerebellum. 4. Acute/subacute nonhemorrhagic infarct involving the posterior left temporal and parietal lobe measuring up to 18 mm. 5. Additional acute nonhemorrhagic infarcts in the anterior right frontal cortex and high right parietal white matter small focus of acute nonhemorrhagic infarct in the posterior right occipital lobe, adjacent to the area chronic encephalomalacia. 6. Remote medial right occipital lobe infarct. 7. Remote lacunar infarcts of the inferior cerebellum bilaterally, left greater than right.  Vas [place] transcranial Poor right temporal window limits evaluation. Low normal mean flow  velocities in majority of identified vessels of unclear significance.  *See table(s) above for TCD measurements and  observations.  TEE echo  1. Left ventricular ejection fraction, by estimation, is 60 to 65%. The  left ventricle has normal function. There is mild left ventricular  hypertrophy.   2. Right ventricular systolic function is normal. The right ventricular  size is normal.   3. Left atrial size was mildly dilated. No left atrial/left atrial  appendage thrombus was detected.   4. The mitral valve is degenerative. Moderate mitral valve regurgitation.   5. The aortic valve is tricuspid. Aortic valve regurgitation is mild.  Mild aortic valve sclerosis is present, with no evidence of aortic valve  stenosis.   6. Pulmonic valve regurgitation trivial to mild.   7. Agitated saline contrast bubble study was negative, with no evidence  of any interatrial shunt.   Conclusion(s)/Recommendation(s): No LA/LAA thrombus identified. Negative  bubble study for interatrial shunt. No intracardiac source of embolism  detected on this on this transesophageal echocardiogram.  Discharge Instructions: Discharge Instructions     Diet - low sodium heart healthy   Complete by: As directed    Diet - low sodium heart healthy   Complete by: As directed    For home use only DME Hospital bed   Complete by: As directed    Length of Need: 6 Months   Bed type: Semi-electric   Increase activity slowly   Complete by: As directed    Increase activity slowly   Complete by: As directed    Leave dressing on - Keep it clean, dry, and intact until clinic visit   Complete by: As directed    No wound care   Complete by: As directed    No wound care   Complete by: As directed       You were admitted for an tibia and fibula fracture acute kidney injury, and aspiration  pneumonia. Orthopedic surgery performed an external fixation on your broken leg. You were given IV fluids with improvement in your renal function. Your pneumonia was treated with a course of the antibiotic Unasyn. During your hospital stay, you also began to  require supplemental oxygen. Your labored breathing was determined to be related to congestive heart failure, so we gave you diuretic Lasix to help remove some of the excess fluid that had accumulated in your lungs.  Your Plavix was changed to Eliquis '5mg'$ , please ensure that you take this each day.  Please follow up with an Ear, Nose, and throat specialist for the lesion on your tongue.  Please Follow up with a urologist for the foley cath and voiding trials to evaluate your urinary retention.  Signed: Delene Ruffini, MD 11/11/2020, 2:54 PM   Pager: '@[email or web address]'$ @

## 2020-11-11 NOTE — Progress Notes (Signed)
Occupational Therapy Treatment Patient Details Name: Austin Andrade MRN: [id number] DOB: 01-12-1951 Today's Date: 11/11/2020    History of present illness 70 y.o. male presenting to ED 10/30/20 after falling down a flight of stairs. Patient found to have acute A-fib with RVR, AKI, rhabdo, CAP vs aspiration pneumonia, SDH, hepatocellular injury, metabolic acidosis and acute toxic metabolic encephalopathy. MRI (+) multifocal acute nonhemorrhagic infarcts in R superior cerebellum, posterior L temporal and parietal lobes, anterior R frontal cortex, R parietal white matter, and R occipital lobe. X ray (+) open L distal tib/fib fx s/p external fixation on 10/30/20 NWB post op. PMHx significant for ischemic vascular disease and alcohol use disorder.   OT comments  Patient met lying supine in bed in agreement with OT treatment session. Focus on session on sitting balance, activity tolerance and functional transfers. Patient with continued hesitance about sit to stand transfers despite education on technique. Progress from supine to recliner via lateral scoot transfer with Min A at LLE to ensure adherence to LLE NWB precautions. Patient would benefit from continued acute OT services in prep for safe d/c to next level of care. Continued recommendation for SNF rehab.   Follow Up Recommendations  SNF    Equipment Recommendations  Other (comment) (Defer to next level of care)    Recommendations for Other Services      Precautions / Restrictions Precautions Precautions: Fall Required Braces or Orthoses: Other Brace Other Brace: LLE external fixation, R PREVALON boot Restrictions Weight Bearing Restrictions: Yes LLE Weight Bearing: Non weight bearing       Mobility Bed Mobility Overal bed mobility: Needs Assistance Bed Mobility: Supine to Sit;Sit to Supine     Supine to sit: Min assist;HOB elevated;+2 for safety/equipment     General bed mobility comments: Min A to elevate trunk. Able to advance  BLE to EOB without external assist. Requires increased time/effort.    Transfers Overall transfer level: Needs assistance Equipment used: None Transfers: Lateral/Scoot Transfers          Lateral/Scoot Transfers: Min assist General transfer comment: Min A for lateral scoot transfer to recliner on R with assist to maintain LLE NWB only.    Balance Overall balance assessment: Needs assistance Sitting-balance support: Feet supported;Bilateral upper extremity supported Sitting balance-Leahy Scale: Fair                                     ADL either performed or assessed with clinical judgement   ADL Overall ADL's : Needs assistance/impaired                     Lower Body Dressing: Maximal assistance Lower Body Dressing Details (indicate cue type and reason): Max A to don footwear seated EOB. Toilet Transfer: Maximal Print production planner Details (indicate cue type and reason): Min A via lateral scoot transfer to recliner.                 Vision       Perception     Praxis      Cognition Arousal/Alertness: Awake/alert Behavior During Therapy: Flat affect Overall Cognitive Status: Within Functional Limits for tasks assessed                                          Exercises  Shoulder Instructions       General Comments      Pertinent Vitals/ Pain       Pain Assessment: 0-10 Pain Score: 7  Pain Location: Chronic neck pain (does not report pain in LLE) Pain Descriptors / Indicators: Grimacing;Guarding Pain Intervention(s): Limited activity within patient's tolerance;Monitored during session;Repositioned  Home Living                                          Prior Functioning/Environment              Frequency  Min 2X/week        Progress Toward Goals  OT Goals(current goals can now be found in the care plan section)  Progress towards OT goals: Progressing toward  goals  Acute Rehab OT Goals Patient Stated Goal: Decrease pain OT Goal Formulation: With patient Time For Goal Achievement: 11/17/20 Potential to Achieve Goals: Good ADL Goals Pt Will Perform Grooming: with set-up;sitting Pt Will Perform Upper Body Dressing: with set-up;sitting Pt Will Perform Lower Body Dressing: with min assist;sitting/lateral leans;sit to/from stand Pt Will Transfer to Toilet: with min assist;bedside commode;squat pivot transfer Additional ADL Goal #1: Patient will maintain static sitting balance at  EOB with I in prep for ADLs and functional transfers. Additional ADL Goal #2: Patient will complete sit to stand transfers with Min A, use of RW and adherence to LLE NWB precautions.  Plan Discharge plan remains appropriate;Frequency remains appropriate    Co-evaluation                 AM-PAC OT "6 Clicks" Daily Activity     Outcome Measure   Help from another person eating meals?: A Little Help from another person taking care of personal grooming?: A Little Help from another person toileting, which includes using toliet, bedpan, or urinal?: A Lot Help from another person bathing (including washing, rinsing, drying)?: A Lot Help from another person to put on and taking off regular upper body clothing?: A Lot Help from another person to put on and taking off regular lower body clothing?: A Lot 6 Click Score: 14    End of Session Equipment Utilized During Treatment: Gait belt  OT Visit Diagnosis: Unsteadiness on feet (R26.81);Other abnormalities of gait and mobility (R26.89);Muscle weakness (generalized) (M62.81);History of falling (Z91.81);Pain Pain - part of body:  (Neck)   Activity Tolerance Patient tolerated treatment well;No increased pain   Patient Left in chair;with call bell/phone within reach;with chair alarm set   Nurse Communication Mobility status        Time: 1326-1348 OT Time Calculation (min): 22 min  Charges: OT General Charges $OT  Visit: 1 Visit OT Treatments $Therapeutic Activity: 8-22 mins  Destanae H. OTR/L Supplemental OT, Department of rehab services [phone]   Destanae R H. 11/11/2020, 1:58 PM  

## 2020-11-11 NOTE — Care Management Important Message (Signed)
Important Message  Patient Details  Name: Austin Andrade MRN: [id number] Date of Birth: 22-Mar-1951   Medicare Important Message Given:  Yes - Important Message mailed due to current National Emergency   Verbal consent obtained due to current National Emergency  Relationship to patient: Self Contact Name: Darrius Call Date: 11/11/20  Time: 1416 Phone: [phone] Outcome: No Answer/Busy Important Message mailed to: Patient address on file    [name] 11/11/2020, 2:16 PM

## 2020-11-11 NOTE — Progress Notes (Signed)
PT Cancellation Note  Patient Details Name: Austin Andrade MRN: [id number] DOB: 1950/08/21   Cancelled Treatment:    Reason Eval/Treat Not Completed: Pain limiting ability to participate. Declining because he wants more pain meds, nursing is aware and cannot give more for two more hours.  Follow up as time and pt allow.   Ramond Dial 11/11/2020, 3:43 PM  Mee Hives, PT MS Acute Rehab Dept. Number: [place] [facility] [place]

## 2020-11-11 NOTE — Progress Notes (Signed)
Report called to LPN Paulina at [facility] [phone]). Informed about the patient's external fixator to Left leg and foley catheter. Questions and concerns answered.

## 2020-11-11 NOTE — Plan of Care (Signed)
  Problem: Education: Goal: Knowledge of General Education information will improve Description: Including pain rating scale, medication(s)/side effects and non-pharmacologic comfort measures Outcome: Adequate for Discharge   Problem: Health Behavior/Discharge Planning: Goal: Ability to manage health-related needs will improve Outcome: Adequate for Discharge   Problem: Clinical Measurements: Goal: Ability to maintain clinical measurements within normal limits will improve Outcome: Adequate for Discharge Goal: Will remain free from infection Outcome: Adequate for Discharge Goal: Diagnostic test results will improve Outcome: Adequate for Discharge Goal: Respiratory complications will improve Outcome: Adequate for Discharge Goal: Cardiovascular complication will be avoided Outcome: Adequate for Discharge   Problem: Activity: Goal: Risk for activity intolerance will decrease Outcome: Adequate for Discharge   Problem: Nutrition: Goal: Adequate nutrition will be maintained Outcome: Adequate for Discharge   Problem: Coping: Goal: Level of anxiety will decrease Outcome: Adequate for Discharge   Problem: Elimination: Goal: Will not experience complications related to bowel motility Outcome: Adequate for Discharge Goal: Will not experience complications related to urinary retention Outcome: Adequate for Discharge   Problem: Pain Managment: Goal: General experience of comfort will improve Outcome: Adequate for Discharge   Problem: Safety: Goal: Ability to remain free from injury will improve Outcome: Adequate for Discharge   Problem: Skin Integrity: Goal: Risk for impaired skin integrity will decrease Outcome: Adequate for Discharge   Problem: Education: Goal: Knowledge of the prescribed therapeutic regimen will improve Outcome: Adequate for Discharge   Problem: Activity: Goal: Ability to increase mobility will improve Outcome: Adequate for Discharge   Problem:  Physical Regulation: Goal: Postoperative complications will be avoided or minimized Outcome: Adequate for Discharge   Problem: Pain Management: Goal: Pain level will decrease with appropriate interventions Outcome: Adequate for Discharge   Problem: Skin Integrity: Goal: Will show signs of wound healing Outcome: Adequate for Discharge   Problem: Education: Goal: Knowledge of disease or condition will improve Outcome: Adequate for Discharge Goal: Knowledge of secondary prevention will improve Outcome: Adequate for Discharge Goal: Knowledge of patient specific risk factors addressed and post discharge goals established will improve Outcome: Adequate for Discharge Goal: Individualized Educational Video(s) Outcome: Adequate for Discharge   Problem: Health Behavior/Discharge Planning: Goal: Ability to manage health-related needs will improve Outcome: Adequate for Discharge   Problem: Self-Care: Goal: Verbalization of feelings and concerns over difficulty with self-care will improve Outcome: Adequate for Discharge Goal: Ability to communicate needs accurately will improve Outcome: Adequate for Discharge

## 2020-11-13 ENCOUNTER — Other Ambulatory Visit (HOSPITAL_BASED_OUTPATIENT_CLINIC_OR_DEPARTMENT_OTHER): Payer: Self-pay

## 2020-11-16 ENCOUNTER — Telehealth (HOSPITAL_COMMUNITY): Payer: Self-pay

## 2020-11-16 ENCOUNTER — Other Ambulatory Visit (HOSPITAL_COMMUNITY): Payer: Self-pay

## 2020-11-16 NOTE — Telephone Encounter (Signed)
Transitions of Care Pharmacy  ° °Call attempted for a pharmacy transitions of care follow-up. HIPAA appropriate voicemail was left with call back information provided.  ° °Call attempt #1. Will follow-up in 2-3 days.  °  °

## 2020-11-17 ENCOUNTER — Telehealth (HOSPITAL_COMMUNITY): Payer: Self-pay

## 2020-11-17 NOTE — Telephone Encounter (Signed)
Transitions of Care Pharmacy   Call attempted for a pharmacy transitions of care follow-up. Unable tot leave voicemail.  Call attempt #2. Will follow-up in 2-3 days.

## 2020-11-18 ENCOUNTER — Telehealth (HOSPITAL_COMMUNITY): Payer: Self-pay

## 2020-11-18 NOTE — Telephone Encounter (Signed)
Transitions of Care Pharmacy   Call attempted for a pharmacy transitions of care follow-up. Unable to leave voicemail.  Call attempt #3. Will no longer attempt follow up for TOC pharmacy   

## 2020-12-17 ENCOUNTER — Encounter (HOSPITAL_COMMUNITY): Payer: Self-pay | Admitting: Orthopedic Surgery

## 2020-12-17 NOTE — Progress Notes (Addendum)
I called Austin Andrade office, spoke to [facility] to ask if Mr.  Andrade needs to stop Eliquis and to ask that orders be entered.  Austin Andrade called me back after speaking to Austin Andrade and said that Eliquis does not need to be held. I gave Austin Andrade nurse this information as well as included it in the Pre- Procedure Instructions I faxed to Austin Andrade, attention [employer].  Austin Andrade is a total care patient he can use his hands, but he is unable to stand. I was told that patient does not have s/s of Covid. Austin Andrade is a DM, he is not on medication. SNF does not check CBG I asked that it be checked on the morning of surgery and gave instructions on what to do if CBG is less than 70. Patient's nurse reports normal vital signs when they were taken last. I faxed instructions to Austin Andrade, [employer] nurse.

## 2020-12-17 NOTE — Pre-Procedure Instructions (Signed)
    Devontay Gerstenberger  12/17/2020        Your procedure is scheduled on Tuesday, September 6  Report to [facility] Admitting at  5:30 AM - as soon as you can after 6.  Call this number if you have problems the morning of surgery:  [phone]   Remember:  Do not eat or drink after midnight the night before.   Take these medicines the morning of surgery with A SIP OF WATER:Amlodipine, Gabapentin, Tamsulosin, Metoprolol, Crestor. Prn Tramadol, Oxycodone.   As of today:STOP taking Aspirin Products (Goody Powder, Excedrin Migraine), Ibuprofen (Advil), Naproxen (Aleve), Vitamins and Herbal Products (ie Fish Oil).  Mr. Digges may continue to take Eliquis- per Dr. Marcelino Scot to [facility] to me.   Mr. Barrentine should have a shower, with antibacteria soap, the morning of surgery . Dry off with a clean towel.  Patient should not have lotions, powders, colognes, deodorant, jewelry, or piercing's. Wear clean comfortable clothes.  Brush teeth.     Do not wear jewelry, make-up or nail polish.  Do not wear lotions, powders, or perfumes, or deodorant.       Men may shave face and neck.  Do not bring valuables to the hospital.  [facility] is not responsible for any belongings or valuables.  Contacts, dentures or bridgework may not be worn into surgery. Bring a case if you have one for glasses.

## 2020-12-17 NOTE — Pre-Procedure Instructions (Signed)
    Austin Andrade  12/17/2020    Your procedure is scheduled on  Tuesday, September 6.  Report to [facility], Main Entrance or Entrance "A" at  5:30 AM .                   Surgery or procedure is scheduled to begin at  8:00 AM   Call this number if you have problems the morning of surgery: [phone]  This is the number for the Pre- Surgical Desk.                >>>>>Please send patient's Medication Record with medications administrated documentation. ( this information is required prior to OR. This includes medications that   were given or on hold. )  DO Not give Metformin on the Morning of surgery.  Medications [name] may take on the morning of surgery:   Amlodipine Metoprolol Tamsulosin  Gabapentin  Check CBG the morning of surgery, if CBG is < 70 patient needs to be treated without food.  Patient should have 4 glucose tablets or 1 tube of glucose gel.  Recheck the CBG in 15 minutes; if CBG does not rise above 70, call [phone] for more instructions.    [name] Patient should have a shower, with antibacteria soap, the morning of surgery . Dry off with a clean towel.  Patient should not have lotions, powders, colognes, deodorant, jewelry, or piercing's. Wear clean comfortable clothes.  Brush teeth.  Patient may not wear glasses, contact lens, hearing aids or dentures/partials, if he wears glasses and has a glasses case, pleas send it to the hospital with him.  [facility] is not responsible for valuables or personal belongings.  I have called Dr. Carlean Jews office  and spoke with Rebekah Chesterfield , I asked if patient is suposed to stop Eliquis.

## 2020-12-17 NOTE — Pre-Procedure Instructions (Addendum)
    Jayant Berteau  12/17/2020    Your procedure is scheduled on  Tuesday, September 6.  Report to [facility], Main Entrance or Entrance "A" at  5:30 AM .                   Surgery or procedure is scheduled to begin at  8:00 AM   Call this number if you have problems the morning of surgery: [phone]  This is the number for the Pre- Surgical Desk.                >>>>>Please send patient's Medication Record with medications administrated documentation. ( this information is required prior to OR. This includes medications that   were given or on hold. )  DO Not give Metformin on the Morning of surgery.  Medications Mr. Gentleman may take on the morning of surgery:   Amlodipine Metoprolol Tamsulosin  Gabapentin  Check CBG the morning of surgery, if CBG is < 70 patient needs to be treated without food.  Patient should have 4 glucose tablets or 1 tube of glucose gel.  Recheck the CBG in 15 minutes; if CBG does not rise above 70, call [phone] for more instructions.    Mr. Salata Patient should have a shower, with antibacteria soap, the morning of surgery . Dry off with a clean towel.  Patient should not have lotions, powders, colognes, deodorant, jewelry, or piercing's. Wear clean comfortable clothes.  Brush teeth.  Patient may not wear glasses, contact lens, hearing aids or dentures/partials, if he wears glasses and has a glasses case, pleas send it to the hospital with him.  [facility] is not responsible for valuables or personal belongings.  I have called Dr. Carlean Jews office  and spoke with Rebekah Chesterfield , I asked if patient is suposed to stop Eliquis.

## 2020-12-18 ENCOUNTER — Encounter (HOSPITAL_COMMUNITY): Payer: Self-pay | Admitting: Orthopedic Surgery
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
## 2020-12-18 NOTE — Anesthesia Preprocedure Evaluation (Addendum)
Anesthesia Evaluation  Patient identified by MRN, date of birth, ID band Patient awake    Reviewed: Allergy & Precautions, H&P , NPO status , Patient's Chart, lab work & pertinent test results  Airway Mallampati: II   Neck ROM: full    Dental   Pulmonary Current Smoker and Patient abstained from smoking.,    breath sounds clear to auscultation       Cardiovascular hypertension, + dysrhythmias Atrial Fibrillation  Rhythm:regular Rate:Normal  TEE 11/06/20: IMPRESSIONS  1. Left ventricular ejection fraction, by estimation, is 60 to 65%. The  left ventricle has normal function. There is mild left ventricular  hypertrophy.  2. Right ventricular systolic function is normal. The right ventricular  size is normal.  3. Left atrial size was mildly dilated. No left atrial/left atrial  appendage thrombus was detected.  4. The mitral valve is degenerative. Moderate mitral valve regurgitation.  5. The aortic valve is tricuspid. Aortic valve regurgitation is mild.  Mild aortic valve sclerosis is present, with no evidence of aortic valve  stenosis.  6. Pulmonic valve regurgitation trivial to mild.  7. Agitated saline contrast bubble study was negative, with no evidence  of any interatrial shunt.  - Conclusion(s)/Recommendation(s): No LA/LAA thrombus identified. Negative  bubble study for interatrial shunt. No intracardiac source of embolism  detected on this on this transesophageal echocardiogram.     Neuro/Psych  Neuromuscular disease    GI/Hepatic GERD  ,  Endo/Other  diabetes, Type 2  Renal/GU      Musculoskeletal   Abdominal   Peds  Hematology   Anesthesia Other Findings   Reproductive/Obstetrics                           Anesthesia Physical Anesthesia Plan  ASA: 3  Anesthesia Plan: General   Post-op Pain Management:    Induction: Intravenous  PONV Risk Score and Plan: 1 and  Ondansetron, Dexamethasone and Treatment may vary due to age or medical condition  Airway Management Planned: Oral ETT  Additional Equipment:   Intra-op Plan:   Post-operative Plan: Extubation in OR  Informed Consent: I have reviewed the patients History and Physical, chart, labs and discussed the procedure including the risks, benefits and alternatives for the proposed anesthesia with the patient or authorized representative who has indicated his/her understanding and acceptance.     Dental advisory given  Plan Discussed with: CRNA, Anesthesiologist and Surgeon  Anesthesia Plan Comments: (PAT note written 12/18/2020 by Myra Gianotti, PA-C. )       Anesthesia Quick Evaluation
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
## 2020-12-22 ENCOUNTER — Encounter (HOSPITAL_COMMUNITY)
Admission: RE | Disposition: A | Discharge status: Skilled Nursing Facility | Payer: Self-pay | Source: Home / Self Care | Attending: Orthopedic Surgery

## 2020-12-22 ENCOUNTER — Ambulatory Visit (HOSPITAL_COMMUNITY): Payer: Medicare Other | Admitting: Vascular Surgery

## 2020-12-22 ENCOUNTER — Ambulatory Visit (HOSPITAL_COMMUNITY)
Admission: RE | Admit: 2020-12-22 | Discharge: 2020-12-22 | Disposition: A | Discharge status: Skilled Nursing Facility | Payer: Medicare Other | Attending: Orthopedic Surgery | Admitting: Orthopedic Surgery

## 2020-12-22 ENCOUNTER — Ambulatory Visit (HOSPITAL_COMMUNITY): Payer: Medicare Other

## 2020-12-22 ENCOUNTER — Encounter (HOSPITAL_COMMUNITY): Payer: Self-pay | Admitting: Orthopedic Surgery

## 2020-12-22 DIAGNOSIS — Z7901 Long term (current) use of anticoagulants: Secondary | ICD-10-CM | POA: Diagnosis not present

## 2020-12-22 DIAGNOSIS — L97829 Non-pressure chronic ulcer of other part of left lower leg with unspecified severity: Secondary | ICD-10-CM | POA: Insufficient documentation

## 2020-12-22 DIAGNOSIS — I509 Heart failure, unspecified: Secondary | ICD-10-CM | POA: Diagnosis not present

## 2020-12-22 DIAGNOSIS — L97429 Non-pressure chronic ulcer of left heel and midfoot with unspecified severity: Secondary | ICD-10-CM | POA: Diagnosis not present

## 2020-12-22 DIAGNOSIS — E1169 Type 2 diabetes mellitus with other specified complication: Secondary | ICD-10-CM | POA: Insufficient documentation

## 2020-12-22 DIAGNOSIS — T859XXA Unspecified complication of internal prosthetic device, implant and graft, initial encounter: Secondary | ICD-10-CM | POA: Insufficient documentation

## 2020-12-22 DIAGNOSIS — F17210 Nicotine dependence, cigarettes, uncomplicated: Secondary | ICD-10-CM | POA: Insufficient documentation

## 2020-12-22 DIAGNOSIS — Z825 Family history of asthma and other chronic lower respiratory diseases: Secondary | ICD-10-CM | POA: Insufficient documentation

## 2020-12-22 DIAGNOSIS — Z79899 Other long term (current) drug therapy: Secondary | ICD-10-CM | POA: Diagnosis not present

## 2020-12-22 DIAGNOSIS — Z7902 Long term (current) use of antithrombotics/antiplatelets: Secondary | ICD-10-CM | POA: Insufficient documentation

## 2020-12-22 DIAGNOSIS — I110 Hypertensive heart disease with heart failure: Secondary | ICD-10-CM | POA: Diagnosis not present

## 2020-12-22 DIAGNOSIS — X58XXXA Exposure to other specified factors, initial encounter: Secondary | ICD-10-CM | POA: Diagnosis not present

## 2020-12-22 DIAGNOSIS — I080 Rheumatic disorders of both mitral and aortic valves: Secondary | ICD-10-CM | POA: Insufficient documentation

## 2020-12-22 DIAGNOSIS — M869 Osteomyelitis, unspecified: Secondary | ICD-10-CM | POA: Diagnosis not present

## 2020-12-22 DIAGNOSIS — Z419 Encounter for procedure for purposes other than remedying health state, unspecified: Secondary | ICD-10-CM

## 2020-12-22 DIAGNOSIS — Z833 Family history of diabetes mellitus: Secondary | ICD-10-CM | POA: Diagnosis not present

## 2020-12-22 HISTORY — DX: Nonrheumatic mitral (valve) insufficiency: I34.0

## 2020-12-22 HISTORY — PX: HARDWARE REMOVAL: SHX979

## 2020-12-22 HISTORY — DX: Type 2 diabetes mellitus without complications: E11.9

## 2020-12-22 HISTORY — DX: Cardiac arrhythmia, unspecified: I49.9

## 2020-12-22 LAB — GLUCOSE, CAPILLARY
Glucose-Capillary: 108 mg/dL — ABNORMAL HIGH (ref 70–99)
Glucose-Capillary: 96 mg/dL (ref 70–99)

## 2020-12-22 LAB — BASIC METABOLIC PANEL
Anion gap: 8 (ref 5–15)
BUN: 13 mg/dL (ref 8–23)
CO2: 28 mmol/L (ref 22–32)
Calcium: 9.7 mg/dL (ref 8.9–10.3)
Chloride: 100 mmol/L (ref 98–111)
Creatinine, Ser: 0.79 mg/dL (ref 0.61–1.24)
GFR, Estimated: >60 mL/min (ref >60)
Glucose, Bld: 129 mg/dL — ABNORMAL HIGH (ref 70–99)
Potassium: 3.8 mmol/L (ref 3.5–5.1)
Sodium: 136 mmol/L (ref 135–145)

## 2020-12-22 LAB — CBC
HCT: 35.7 % — ABNORMAL LOW (ref 39.0–52.0)
Hemoglobin: 11.4 g/dL — ABNORMAL LOW (ref 13.0–17.0)
MCH: 32.9 pg (ref 26.0–34.0)
MCHC: 31.9 g/dL (ref 30.0–36.0)
MCV: 103.2 fL — ABNORMAL HIGH (ref 80.0–100.0)
Platelets: 328 10*3/uL (ref 150–400)
RBC: 3.46 MIL/uL — ABNORMAL LOW (ref 4.22–5.81)
RDW: 13.1 % (ref 11.5–15.5)
WBC: 13.8 10*3/uL — ABNORMAL HIGH (ref 4.0–10.5)
nRBC: 0 % (ref 0.0–0.2)

## 2020-12-22 SURGERY — REMOVAL, HARDWARE
Anesthesia: General | Laterality: Left

## 2020-12-22 MED ORDER — CEFAZOLIN SODIUM-DEXTROSE 2-3 GM-%(50ML) IV SOLR: INTRAVENOUS | Status: DC | PRN

## 2020-12-22 MED ORDER — SUGAMMADEX SODIUM 200 MG/2ML IV SOLN
INTRAVENOUS | Status: DC | PRN
  Administered 2020-12-22: 200 mg via INTRAVENOUS

## 2020-12-22 MED ORDER — OXYCODONE HCL 5 MG PO TABS
5.0000 mg | ORAL_TABLET | Freq: Once | ORAL | Status: AC | PRN
  Administered 2020-12-22: 5 mg via ORAL

## 2020-12-22 MED ORDER — PROPOFOL 10 MG/ML IV BOLUS
INTRAVENOUS | Status: AC
  Filled 2020-12-22: qty 20

## 2020-12-22 MED ORDER — ONDANSETRON HCL 4 MG/2ML IJ SOLN
INTRAMUSCULAR | Status: AC
  Filled 2020-12-22: qty 2

## 2020-12-22 MED ORDER — 0.9 % SODIUM CHLORIDE (POUR BTL) OPTIME
TOPICAL | Status: DC | PRN
  Administered 2020-12-22 (×2): 1000 mL

## 2020-12-22 MED ORDER — ONDANSETRON HCL 4 MG/2ML IJ SOLN
INTRAMUSCULAR | Status: DC | PRN
  Administered 2020-12-22: 4 mg via INTRAVENOUS

## 2020-12-22 MED ORDER — LIDOCAINE 2% (20 MG/ML) 5 ML SYRINGE
INTRAMUSCULAR | Status: DC | PRN
  Administered 2020-12-22: 60 mg via INTRAVENOUS

## 2020-12-22 MED ORDER — PROPOFOL 10 MG/ML IV BOLUS
INTRAVENOUS | Status: DC | PRN
  Administered 2020-12-22: 120 mg via INTRAVENOUS

## 2020-12-22 MED ORDER — OXYCODONE HCL 5 MG PO TABS
ORAL_TABLET | ORAL | Status: AC
  Filled 2020-12-22: qty 1

## 2020-12-22 MED ORDER — METOPROLOL TARTRATE 50 MG PO TABS
50.0000 mg | ORAL_TABLET | ORAL | Status: AC
  Filled 2020-12-22: qty 1

## 2020-12-22 MED ORDER — CHLORHEXIDINE GLUCONATE 0.12 % MT SOLN
15.0000 mL | OROMUCOSAL | Status: AC
  Administered 2020-12-22: 15 mL via OROMUCOSAL
  Filled 2020-12-22 (×2): qty 15

## 2020-12-22 MED ORDER — LIDOCAINE 2% (20 MG/ML) 5 ML SYRINGE
INTRAMUSCULAR | Status: AC
  Filled 2020-12-22: qty 5

## 2020-12-22 MED ORDER — ROCURONIUM BROMIDE 10 MG/ML (PF) SYRINGE
PREFILLED_SYRINGE | INTRAVENOUS | Status: AC
  Filled 2020-12-22: qty 10

## 2020-12-22 MED ORDER — DEXAMETHASONE SODIUM PHOSPHATE 10 MG/ML IJ SOLN
INTRAMUSCULAR | Status: DC | PRN
  Administered 2020-12-22: 10 mg via INTRAVENOUS

## 2020-12-22 MED ORDER — CEFAZOLIN SODIUM-DEXTROSE 2-4 GM/100ML-% IV SOLN
2.0000 g | INTRAVENOUS | Status: AC
Start: 2020-12-22 — End: 2020-12-22
  Administered 2020-12-22: 2 g via INTRAVENOUS
  Filled 2020-12-22: qty 100

## 2020-12-22 MED ORDER — OXYCODONE HCL 5 MG/5ML PO SOLN: 5.0000 mg | Freq: Once | ORAL | Status: AC | PRN

## 2020-12-22 MED ORDER — DEXAMETHASONE SODIUM PHOSPHATE 10 MG/ML IJ SOLN
INTRAMUSCULAR | Status: AC
  Filled 2020-12-22: qty 1

## 2020-12-22 MED ORDER — FENTANYL CITRATE (PF) 250 MCG/5ML IJ SOLN
INTRAMUSCULAR | Status: AC
  Filled 2020-12-22: qty 5

## 2020-12-22 MED ORDER — FENTANYL CITRATE (PF) 100 MCG/2ML IJ SOLN: 25.0000 ug | INTRAMUSCULAR | Status: DC | PRN

## 2020-12-22 MED ORDER — ROCURONIUM BROMIDE 100 MG/10ML IV SOLN: INTRAVENOUS | Status: DC | PRN

## 2020-12-22 MED ORDER — FENTANYL CITRATE (PF) 100 MCG/2ML IJ SOLN: INTRAMUSCULAR | Status: DC | PRN

## 2020-12-22 MED ORDER — METOPROLOL TARTRATE 50 MG PO TABS
ORAL_TABLET | ORAL | Status: AC
  Administered 2020-12-22: 50 mg via ORAL
  Filled 2020-12-22: qty 1

## 2020-12-22 MED ORDER — ONDANSETRON HCL 4 MG/2ML IJ SOLN: 4.0000 mg | Freq: Four times a day (QID) | INTRAMUSCULAR | Status: DC | PRN

## 2020-12-22 MED ORDER — FENTANYL CITRATE (PF) 250 MCG/5ML IJ SOLN
INTRAMUSCULAR | Status: DC | PRN
  Administered 2020-12-22 (×3): 50 ug via INTRAVENOUS

## 2020-12-22 MED ORDER — LACTATED RINGERS IV SOLN: INTRAVENOUS | Status: DC

## 2020-12-22 MED ORDER — ROCURONIUM BROMIDE 10 MG/ML (PF) SYRINGE
PREFILLED_SYRINGE | INTRAVENOUS | Status: DC | PRN
  Administered 2020-12-22: 50 mg via INTRAVENOUS

## 2020-12-22 SURGICAL SUPPLY — 59 items
BAG COUNTER SPONGE SURGICOUNT (BAG) ×2 IMPLANT
BANDAGE ESMARK 6X9 LF (GAUZE/BANDAGES/DRESSINGS) ×1 IMPLANT
BNDG COHESIVE 6X5 TAN STRL LF (GAUZE/BANDAGES/DRESSINGS) ×2 IMPLANT
BNDG ELASTIC 4X5.8 VLCR STR LF (GAUZE/BANDAGES/DRESSINGS) ×2 IMPLANT
BNDG ELASTIC 6X5.8 VLCR STR LF (GAUZE/BANDAGES/DRESSINGS) ×2 IMPLANT
BNDG ESMARK 6X9 LF (GAUZE/BANDAGES/DRESSINGS) ×2
BNDG GAUZE ELAST 4 BULKY (GAUZE/BANDAGES/DRESSINGS) ×2 IMPLANT
BRUSH SCRUB EZ PLAIN DRY (MISCELLANEOUS) ×4 IMPLANT
COVER SURGICAL LIGHT HANDLE (MISCELLANEOUS) ×4 IMPLANT
CUFF TOURN SGL QUICK 18X4 (TOURNIQUET CUFF) IMPLANT
CUFF TOURN SGL QUICK 24 (TOURNIQUET CUFF)
CUFF TOURN SGL QUICK 34 (TOURNIQUET CUFF)
CUFF TRNQT CYL 24X4X16.5-23 (TOURNIQUET CUFF) IMPLANT
CUFF TRNQT CYL 34X4.125X (TOURNIQUET CUFF) IMPLANT
DRAPE C-ARM 42X72 X-RAY (DRAPES) IMPLANT
DRAPE C-ARMOR (DRAPES) ×2 IMPLANT
DRAPE U-SHAPE 47X51 STRL (DRAPES) ×2 IMPLANT
DRSG ADAPTIC 3X8 NADH LF (GAUZE/BANDAGES/DRESSINGS) ×2 IMPLANT
DRSG MEPITEL 4X7.2 (GAUZE/BANDAGES/DRESSINGS) ×2 IMPLANT
DRSG PAD ABDOMINAL 8X10 ST (GAUZE/BANDAGES/DRESSINGS) ×4 IMPLANT
ELECT REM PT RETURN 9FT ADLT (ELECTROSURGICAL) ×2
ELECTRODE REM PT RTRN 9FT ADLT (ELECTROSURGICAL) ×1 IMPLANT
GAUZE SPONGE 4X4 12PLY STRL (GAUZE/BANDAGES/DRESSINGS) ×2 IMPLANT
GLOVE SRG 8 PF TXTR STRL LF DI (GLOVE) ×1 IMPLANT
GLOVE SURG ENC MOIS LTX SZ7.5 (GLOVE) ×2 IMPLANT
GLOVE SURG ENC MOIS LTX SZ8 (GLOVE) ×2 IMPLANT
GLOVE SURG UNDER POLY LF SZ7.5 (GLOVE) ×2 IMPLANT
GLOVE SURG UNDER POLY LF SZ8 (GLOVE) ×1
GOWN STRL REUS W/ TWL LRG LVL3 (GOWN DISPOSABLE) ×2 IMPLANT
GOWN STRL REUS W/ TWL XL LVL3 (GOWN DISPOSABLE) ×1 IMPLANT
GOWN STRL REUS W/TWL LRG LVL3 (GOWN DISPOSABLE) ×2
GOWN STRL REUS W/TWL XL LVL3 (GOWN DISPOSABLE) ×1
KIT BASIN OR (CUSTOM PROCEDURE TRAY) ×2 IMPLANT
KIT TURNOVER KIT B (KITS) ×2 IMPLANT
MANIFOLD NEPTUNE II (INSTRUMENTS) ×2 IMPLANT
NEEDLE 22X1 1/2 (OR ONLY) (NEEDLE) IMPLANT
NS IRRIG 1000ML POUR BTL (IV SOLUTION) ×2 IMPLANT
PACK ORTHO EXTREMITY (CUSTOM PROCEDURE TRAY) ×2 IMPLANT
PAD ARMBOARD 7.5X6 YLW CONV (MISCELLANEOUS) ×4 IMPLANT
PADDING CAST COTTON 6X4 STRL (CAST SUPPLIES) ×6 IMPLANT
SPONGE T-LAP 18X18 ~~LOC~~+RFID (SPONGE) ×2 IMPLANT
STAPLER VISISTAT 35W (STAPLE) IMPLANT
STOCKINETTE IMPERVIOUS LG (DRAPES) ×2 IMPLANT
STRIP CLOSURE SKIN 1/2X4 (GAUZE/BANDAGES/DRESSINGS) IMPLANT
SUCTION FRAZIER HANDLE 10FR (MISCELLANEOUS)
SUCTION TUBE FRAZIER 10FR DISP (MISCELLANEOUS) IMPLANT
SUT ETHILON 3 0 PS 1 (SUTURE) IMPLANT
SUT PDS AB 2-0 CT1 27 (SUTURE) IMPLANT
SUT VIC AB 0 CT1 27 (SUTURE)
SUT VIC AB 0 CT1 27XBRD ANBCTR (SUTURE) IMPLANT
SUT VIC AB 2-0 CT1 27 (SUTURE)
SUT VIC AB 2-0 CT1 TAPERPNT 27 (SUTURE) IMPLANT
SYR CONTROL 10ML LL (SYRINGE) IMPLANT
TOWEL GREEN STERILE (TOWEL DISPOSABLE) ×4 IMPLANT
TOWEL GREEN STERILE FF (TOWEL DISPOSABLE) ×4 IMPLANT
TUBE CONNECTING 12X1/4 (SUCTIONS) ×2 IMPLANT
UNDERPAD 30X36 HEAVY ABSORB (UNDERPADS AND DIAPERS) ×2 IMPLANT
WATER STERILE IRR 1000ML POUR (IV SOLUTION) ×4 IMPLANT
YANKAUER SUCT BULB TIP NO VENT (SUCTIONS) ×2 IMPLANT

## 2020-12-22 NOTE — Op Note (Signed)
12/22/2020  9:47 PM  PATIENT:  Austin Andrade  70 y.o. male  PRE-OPERATIVE DIAGNOSIS:   1. RETAINED EXTERNALFIXATOR LEFT ANKLE 2. ULCERATED PIN SITES LEG AND HEEL  POST-OPERATIVE DIAGNOSIS:   1. RETAINED EXTERNALFIXATOR LEFT ANKLE 2. ULCERATED PIN SITES LEG AND HEEL 3. OSTEOMYELITIS LEFT HEEL  PROCEDURE:  Procedure(s): 1. REMOVAL EXTERNAL FIXATION  LEFT LEG (Left) 2. DEBRIDEMENT OF ULCERATED PIN SITES WITH PARTIAL EXCISION OF THE LEFT HEEL USING CURETTAGE 3. MANUAL APPLICATION OF STRESS UNDER FLUOROSCOPY  SURGEON:  Surgeon(s) and Role:    * Altamese Walton, MD - Primary  PHYSICIAN ASSISTANT: Ainsley Spinner, PA-C  ANESTHESIA:   general  EBL:  20 mL   BLOOD ADMINISTERED:none  DRAINS: none   LOCAL MEDICATIONS USED:  NONE  SPECIMEN:  No Specimen  DISPOSITION OF SPECIMEN:  N/A  COUNTS:  YES  TOURNIQUET: None  DICTATION: Note written in EPIC  PLAN OF CARE: Discharge to home after PACU  PATIENT DISPOSITION:  PACU - hemodynamically stable.   Delay start of Pharmacological VTE agent (>24hrs) due to surgical blood loss or risk of bleeding: no  INDICATIONS: Patient is s/p external fixation of left open pilon fracture. Has developed gross loosening of the calcaneal pin which requires removal. Suspect nonunion. I discussed with the patient the risks and benefits of surgery, including the possibility of persistent infection, loss of reduction, nerve injury, vessel injury, wound breakdown, arthritis, DVT/ PE, loss of motion, malunion, nonunion, and need for further surgery among others. He acknowledged these risks and wished to proceed.  BRIEF SUMMARY OF PROCEDURE: After administration of preoperative antibiotics the patient was taken to the operating room and general anesthesia induced. A time out was held. The fixator clamps were then loosened and the pins removed, followed by a thorough scrubbing with chlorhexidine and wash. The pin sites, which had ulcerated at the leg and heel  around the pins were then debrided with curettes at the skin, subcutaneous tissues, muscle fascia layers, as well as the near and far bone cortices, removing all discernible devitalized necrotic tissue, bone debris, and desiccated fibrinous material. The calcaneal pin tract was specifically and aggressively debrided with curettage all the way through the bone canal to the opposite side of the calcaneus. This canal and all pin sites were thoroughly irrigated with saline.   C-arm was then brought in and AP, mortise, and lateral views obtained of the ankle to evaluate healing and maintenance of reduction. I also applied force in AP and LAT projections while using live fluoro x-ray to confirm that there remained motion at the fracture site but none at the articular surface.   The wounds were then dressed with adaptic, gauze, softly compressive dressing followed by a posterior and stirrup splint. Ainsley Spinner, PA-C, was present and assisting throughout.  PROGNOSIS: Patient will be NWB in a splint until follow up in the office in 10 days at which time we anticipate transitioning into a cast or CAM boot. Discussion about continued nonoperative vs further surgery will be held. No formal DVT prophylaxis is required given anticipated mobilization and time from initial surgery and injury.  Altamese [place], MD [facility], [facility] [phone]

## 2020-12-22 NOTE — Transfer of Care (Signed)
Immediate Anesthesia Transfer of Care Note  Patient: Austin Andrade  Procedure(s) Performed: HARDWARE REMOVAL (Left)  Patient Location: PACU  Anesthesia Type:General  Level of Consciousness: awake, alert  and oriented  Airway & Oxygen Therapy: Patient Spontanous Breathing and Patient connected to face mask oxygen  Post-op Assessment: Report given to RN and Post -op Vital signs reviewed and stable  Post vital signs: Reviewed and stable  Last Vitals:  Vitals Value Taken Time  BP 107/74 12/22/20 0932  Temp    Pulse 65 12/22/20 0935  Resp 12 12/22/20 0935  SpO2 100 % 12/22/20 0935  Vitals shown include unvalidated device data.  Last Pain:  Vitals:   12/22/20 0644  TempSrc:   PainSc: 0-No pain      Patients Stated Pain Goal: 0 ([id number])  Complications: No notable events documented.

## 2020-12-22 NOTE — Anesthesia Procedure Notes (Addendum)
Procedure Name: Intubation Date/Time: 12/22/2020 8:40 AM Performed by: Trinna Post., CRNA Pre-anesthesia Checklist: Patient identified, Emergency Drugs available, Suction available and Patient being monitored Patient Re-evaluated:Patient Re-evaluated prior to induction Oxygen Delivery Method: Circle system utilized Preoxygenation: Pre-oxygenation with 100% oxygen Induction Type: IV induction Ventilation: Mask ventilation without difficulty Laryngoscope Size: Mac and 3 Grade View: Grade III Endobronchial tube: Right Tube size: 7.0 mm Number of attempts: 1 Airway Equipment and Method: Stylet Placement Confirmation: ETT inserted through vocal cords under direct vision, positive ETCO2 and breath sounds checked- equal and bilateral Secured at: 22 cm Tube secured with: Tape Dental Injury: Teeth and Oropharynx as per pre-operative assessment

## 2020-12-22 NOTE — Discharge Instructions (Signed)
Orthopaedic Trauma Service Discharge Instructions   General Discharge Instructions  Orthopaedic Injuries:  Left distal tibia and fibula fracture s/p ex fix removal and application of splint   WEIGHT BEARING STATUS: Nonweightbearing Left leg   RANGE OF MOTION/ACTIVITY: toe and knee range of motion as tolerated. No ankle motion as you are in a splint   Wound Care: do not remove splint.  Keep splint clean and dry    Diet: as you were eating previously.  Can use over the counter stool softeners and bowel preparations, such as Miralax, to help with bowel movements.  Narcotics can be constipating.  Be sure to drink plenty of fluids  PAIN MEDICATION USE AND EXPECTATIONS  You have likely been given narcotic medications to help control your pain.  After a traumatic event that results in an fracture (broken bone) with or without surgery, it is ok to use narcotic pain medications to help control one's pain.  We understand that everyone responds to pain differently and each individual patient will be evaluated on a regular basis for the continued need for narcotic medications. Ideally, narcotic medication use should last no more than 6-8 weeks (coinciding with fracture healing).   As a patient it is your responsibility as well to monitor narcotic medication use and report the amount and frequency you use these medications when you come to your office visit.   We would also advise that if you are using narcotic medications, you should take a dose prior to therapy to maximize you participation.  IF YOU ARE ON NARCOTIC MEDICATIONS IT IS NOT PERMISSIBLE TO OPERATE A MOTOR VEHICLE (MOTORCYCLE/CAR/TRUCK/MOPED) OR HEAVY MACHINERY DO NOT MIX NARCOTICS WITH OTHER CNS (CENTRAL NERVOUS SYSTEM) DEPRESSANTS SUCH AS ALCOHOL   POST-OPERATIVE OPIOID TAPER INSTRUCTIONS: It is important to wean off of your opioid medication as soon as possible. If you do not need pain medication after your surgery it is ok to stop  day one. Opioids include: Codeine, Hydrocodone(Norco, Vicodin), Oxycodone(Percocet, oxycontin) and hydromorphone amongst others.  Long term and even short term use of opiods can cause: Increased pain response Dependence Constipation Depression Respiratory depression And more.  Withdrawal symptoms can include Flu like symptoms Nausea, vomiting And more Techniques to manage these symptoms Hydrate well Eat regular healthy meals Stay active Use relaxation techniques(deep breathing, meditating, yoga) Do Not substitute Alcohol to help with tapering If you have been on opioids for less than two weeks and do not have pain than it is ok to stop all together.  Plan to wean off of opioids This plan should start within one week post op of your fracture surgery  Maintain the same interval or time between taking each dose and first decrease the dose.  Cut the total daily intake of opioids by one tablet each day Next start to increase the time between doses. The last dose that should be eliminated is the evening dose.    STOP SMOKING OR USING NICOTINE PRODUCTS!!!!  As discussed nicotine severely impairs your body's ability to heal surgical and traumatic wounds but also impairs bone healing.  Wounds and bone heal by forming microscopic blood vessels (angiogenesis) and nicotine is a vasoconstrictor (essentially, shrinks blood vessels).  Therefore, if vasoconstriction occurs to these microscopic blood vessels they essentially disappear and are unable to deliver necessary nutrients to the healing tissue.  This is one modifiable factor that you can do to dramatically increase your chances of healing your injury.    (This means no smoking, no nicotine gum,  patches, etc)  DO NOT USE NONSTEROIDAL ANTI-INFLAMMATORY DRUGS (NSAID'S)  Using products such as Advil (ibuprofen), Aleve (naproxen), Motrin (ibuprofen) for additional pain control during fracture healing can delay and/or prevent the healing response.   If you would like to take over the counter (OTC) medication, Tylenol (acetaminophen) is ok.  However, some narcotic medications that are given for pain control contain acetaminophen as well. Therefore, you should not exceed more than 4000 mg of tylenol in a day if you do not have liver disease.  Also note that there are may OTC medicines, such as cold medicines and allergy medicines that my contain tylenol as well.  If you have any questions about medications and/or interactions please ask your doctor/PA or your pharmacist.      ICE AND ELEVATE INJURED/OPERATIVE EXTREMITY  Using ice and elevating the injured extremity above your heart can help with swelling and pain control.  Icing in a pulsatile fashion, such as 20 minutes on and 20 minutes off, can be followed.    Do not place ice directly on skin. Make sure there is a barrier between to skin and the ice pack.    Using frozen items such as frozen peas works well as the conform nicely to the are that needs to be iced.  USE AN ACE WRAP OR TED HOSE FOR SWELLING CONTROL  In addition to icing and elevation, Ace wraps or TED hose are used to help limit and resolve swelling.  It is recommended to use Ace wraps or TED hose until you are informed to stop.    When using Ace Wraps start the wrapping distally (farthest away from the body) and wrap proximally (closer to the body)   Example: If you had surgery on your leg or thing and you do not have a splint on, start the ace wrap at the toes and work your way up to the thigh        If you had surgery on your upper extremity and do not have a splint on, start the ace wrap at your fingers and work your way up to the upper arm  IF YOU ARE IN A SPLINT OR CAST DO NOT [place]   If your splint gets wet for any reason please contact the office immediately. You may shower in your splint or cast as long as you keep it dry.  This can be done by wrapping in a cast cover or garbage back (or similar)  Do  Not stick any thing down your splint or cast such as pencils, money, or hangers to try and scratch yourself with.  If you feel itchy take benadryl as prescribed on the bottle for itching  IF YOU ARE IN A CAM BOOT (BLACK BOOT)  You may remove boot periodically. Perform daily dressing changes as noted below.  Wash the liner of the boot regularly and wear a sock when wearing the boot. It is recommended that you sleep in the boot until told otherwise    Call office for the following: Temperature greater than 101F Persistent nausea and vomiting Severe uncontrolled pain Redness, tenderness, or signs of infection (pain, swelling, redness, odor or green/yellow discharge around the site) Difficulty breathing, headache or visual disturbances Hives Persistent dizziness or light-headedness Extreme fatigue Any other questions or concerns you may have after discharge  In an emergency, call 911 or go to an Emergency Department at a nearby hospital  HELPFUL INFORMATION  If you had a block, it will  wear off between 8-24 hrs postop typically.  This is period when your pain may go from nearly zero to the pain you would have had postop without the block.  This is an abrupt transition but nothing dangerous is happening.  You may take an extra dose of narcotic when this happens.  You should wean off your narcotic medicines as soon as you are able.  Most patients will be off or using minimal narcotics before their first postop appointment.   We suggest you use the pain medication the first night prior to going to bed, in order to ease any pain when the anesthesia wears off. You should avoid taking pain medications on an empty stomach as it will make you nauseous.  Do not drink alcoholic beverages or take illicit drugs when taking pain medications.  In most states it is against the law to drive while you are in a splint or sling.  And certainly against the law to drive while taking narcotics.  You may return  to work/school in the next couple of days when you feel up to it.   Pain medication may make you constipated.  Below are a few solutions to try in this order: Decrease the amount of pain medication if you aren't having pain. Drink lots of decaffeinated fluids. Drink prune juice and/or each dried prunes  If the first 3 don't work start with additional solutions Take Colace - an over-the-counter stool softener Take Senokot - an over-the-counter laxative Take Miralax - a stronger over-the-counter laxative     CALL THE OFFICE WITH ANY QUESTIONS OR CONCERNS: [phone]   VISIT OUR WEBSITE FOR ADDITIONAL INFORMATION: [email or web address]

## 2020-12-22 NOTE — Progress Notes (Signed)
Medication list reviewed with Yvetta Coder at [facility].

## 2020-12-23 ENCOUNTER — Encounter (HOSPITAL_COMMUNITY): Payer: Self-pay | Admitting: Orthopedic Surgery

## 2020-12-23 NOTE — Anesthesia Postprocedure Evaluation (Signed)
Anesthesia Post Note  Patient: Austin Andrade  Procedure(s) Performed: HARDWARE REMOVAL (Left)     Patient location during evaluation: PACU Anesthesia Type: General Level of consciousness: awake and alert Pain management: pain level controlled Vital Signs Assessment: post-procedure vital signs reviewed and stable Respiratory status: spontaneous breathing, nonlabored ventilation, respiratory function stable and patient connected to nasal cannula oxygen Cardiovascular status: blood pressure returned to baseline and stable Postop Assessment: no apparent nausea or vomiting Anesthetic complications: no   No notable events documented.  Last Vitals:  Vitals:   12/22/20 1002 12/22/20 1056  BP: 117/64 122/66  Pulse: 65 66  Resp: 17 16  Temp: 36.5 C 36.4 C  SpO2: 100% 100%    Last Pain:  Vitals:   12/22/20 1002  TempSrc:   PainSc: [place]

## 2021-03-21 ENCOUNTER — Encounter (HOSPITAL_COMMUNITY): Payer: Self-pay | Admitting: Radiology

## 2021-03-21 ENCOUNTER — Emergency Department (HOSPITAL_COMMUNITY): Payer: Medicare Other

## 2021-03-21 ENCOUNTER — Inpatient Hospital Stay (HOSPITAL_COMMUNITY)
Admission: EM | Admit: 2021-03-21 | Discharge: 2021-03-29 | DRG: 872 | Disposition: A | Discharge status: Home Health | Payer: Medicare Other | Attending: Student | Admitting: Student

## 2021-03-21 ENCOUNTER — Other Ambulatory Visit: Payer: Self-pay

## 2021-03-21 DIAGNOSIS — R197 Diarrhea, unspecified: Secondary | ICD-10-CM | POA: Diagnosis present

## 2021-03-21 DIAGNOSIS — R778 Other specified abnormalities of plasma proteins: Secondary | ICD-10-CM | POA: Diagnosis present

## 2021-03-21 DIAGNOSIS — K5900 Constipation, unspecified: Secondary | ICD-10-CM | POA: Diagnosis present

## 2021-03-21 DIAGNOSIS — D72829 Elevated white blood cell count, unspecified: Secondary | ICD-10-CM | POA: Diagnosis present

## 2021-03-21 DIAGNOSIS — Z85828 Personal history of other malignant neoplasm of skin: Secondary | ICD-10-CM | POA: Diagnosis not present

## 2021-03-21 DIAGNOSIS — E119 Type 2 diabetes mellitus without complications: Secondary | ICD-10-CM | POA: Diagnosis present

## 2021-03-21 DIAGNOSIS — N400 Enlarged prostate without lower urinary tract symptoms: Secondary | ICD-10-CM | POA: Diagnosis present

## 2021-03-21 DIAGNOSIS — I4891 Unspecified atrial fibrillation: Secondary | ICD-10-CM | POA: Diagnosis present

## 2021-03-21 DIAGNOSIS — I10 Essential (primary) hypertension: Secondary | ICD-10-CM | POA: Diagnosis present

## 2021-03-21 DIAGNOSIS — Z8673 Personal history of transient ischemic attack (TIA), and cerebral infarction without residual deficits: Secondary | ICD-10-CM | POA: Diagnosis not present

## 2021-03-21 DIAGNOSIS — Z66 Do not resuscitate: Secondary | ICD-10-CM | POA: Diagnosis present

## 2021-03-21 DIAGNOSIS — K5732 Diverticulitis of large intestine without perforation or abscess without bleeding: Secondary | ICD-10-CM | POA: Diagnosis present

## 2021-03-21 DIAGNOSIS — F17210 Nicotine dependence, cigarettes, uncomplicated: Secondary | ICD-10-CM | POA: Diagnosis present

## 2021-03-21 DIAGNOSIS — M109 Gout, unspecified: Secondary | ICD-10-CM | POA: Diagnosis present

## 2021-03-21 DIAGNOSIS — A419 Sepsis, unspecified organism: Principal | ICD-10-CM | POA: Diagnosis present

## 2021-03-21 DIAGNOSIS — K529 Noninfective gastroenteritis and colitis, unspecified: Secondary | ICD-10-CM | POA: Diagnosis present

## 2021-03-21 DIAGNOSIS — E860 Dehydration: Secondary | ICD-10-CM | POA: Diagnosis present

## 2021-03-21 DIAGNOSIS — R531 Weakness: Secondary | ICD-10-CM | POA: Diagnosis not present

## 2021-03-21 DIAGNOSIS — I480 Paroxysmal atrial fibrillation: Secondary | ICD-10-CM | POA: Diagnosis present

## 2021-03-21 DIAGNOSIS — I110 Hypertensive heart disease with heart failure: Secondary | ICD-10-CM | POA: Diagnosis present

## 2021-03-21 DIAGNOSIS — E785 Hyperlipidemia, unspecified: Secondary | ICD-10-CM | POA: Diagnosis present

## 2021-03-21 DIAGNOSIS — M542 Cervicalgia: Secondary | ICD-10-CM | POA: Diagnosis present

## 2021-03-21 DIAGNOSIS — Z7901 Long term (current) use of anticoagulants: Secondary | ICD-10-CM

## 2021-03-21 DIAGNOSIS — I2510 Atherosclerotic heart disease of native coronary artery without angina pectoris: Secondary | ICD-10-CM | POA: Diagnosis present

## 2021-03-21 DIAGNOSIS — I5042 Chronic combined systolic (congestive) and diastolic (congestive) heart failure: Secondary | ICD-10-CM | POA: Diagnosis present

## 2021-03-21 DIAGNOSIS — G8929 Other chronic pain: Secondary | ICD-10-CM | POA: Diagnosis present

## 2021-03-21 DIAGNOSIS — R54 Age-related physical debility: Secondary | ICD-10-CM | POA: Diagnosis present

## 2021-03-21 DIAGNOSIS — R5381 Other malaise: Secondary | ICD-10-CM | POA: Diagnosis not present

## 2021-03-21 DIAGNOSIS — R7989 Other specified abnormal findings of blood chemistry: Secondary | ICD-10-CM | POA: Diagnosis present

## 2021-03-21 DIAGNOSIS — I4892 Unspecified atrial flutter: Secondary | ICD-10-CM

## 2021-03-21 DIAGNOSIS — Z20822 Contact with and (suspected) exposure to covid-19: Secondary | ICD-10-CM | POA: Diagnosis present

## 2021-03-21 DIAGNOSIS — F32A Depression, unspecified: Secondary | ICD-10-CM | POA: Diagnosis present

## 2021-03-21 DIAGNOSIS — I248 Other forms of acute ischemic heart disease: Secondary | ICD-10-CM | POA: Diagnosis present

## 2021-03-21 DIAGNOSIS — K219 Gastro-esophageal reflux disease without esophagitis: Secondary | ICD-10-CM | POA: Diagnosis present

## 2021-03-21 DIAGNOSIS — G894 Chronic pain syndrome: Secondary | ICD-10-CM | POA: Diagnosis not present

## 2021-03-21 DIAGNOSIS — I340 Nonrheumatic mitral (valve) insufficiency: Secondary | ICD-10-CM | POA: Diagnosis present

## 2021-03-21 DIAGNOSIS — F320 Major depressive disorder, single episode, mild: Secondary | ICD-10-CM | POA: Diagnosis not present

## 2021-03-21 DIAGNOSIS — K5792 Diverticulitis of intestine, part unspecified, without perforation or abscess without bleeding: Secondary | ICD-10-CM | POA: Diagnosis not present

## 2021-03-21 DIAGNOSIS — Z79899 Other long term (current) drug therapy: Secondary | ICD-10-CM

## 2021-03-21 HISTORY — DX: Paroxysmal atrial fibrillation: I48.0

## 2021-03-21 LAB — COMPREHENSIVE METABOLIC PANEL
ALT: 9 U/L (ref 0–44)
AST: 24 U/L (ref 15–41)
Albumin: 3.3 g/dL — ABNORMAL LOW (ref 3.5–5.0)
Alkaline Phosphatase: 87 U/L (ref 38–126)
Anion gap: 9 (ref 5–15)
BUN: 25 mg/dL — ABNORMAL HIGH (ref 8–23)
CO2: 22 mmol/L (ref 22–32)
Calcium: 9.1 mg/dL (ref 8.9–10.3)
Chloride: 108 mmol/L (ref 98–111)
Creatinine, Ser: 0.88 mg/dL (ref 0.61–1.24)
GFR, Estimated: >60 mL/min (ref >60)
Glucose, Bld: 112 mg/dL — ABNORMAL HIGH (ref 70–99)
Potassium: 4.1 mmol/L (ref 3.5–5.1)
Sodium: 139 mmol/L (ref 135–145)
Total Bilirubin: 0.5 mg/dL (ref 0.3–1.2)
Total Protein: 6.9 g/dL (ref 6.5–8.1)

## 2021-03-21 LAB — I-STAT CHEM 8, ED
BUN: 26 mg/dL — ABNORMAL HIGH (ref 8–23)
Calcium, Ion: 1.05 mmol/L — ABNORMAL LOW (ref 1.15–1.40)
Chloride: 108 mmol/L (ref 98–111)
Creatinine, Ser: 0.8 mg/dL (ref 0.61–1.24)
Glucose, Bld: 105 mg/dL — ABNORMAL HIGH (ref 70–99)
HCT: 44 % (ref 39.0–52.0)
Hemoglobin: 15 g/dL (ref 13.0–17.0)
Potassium: 4.3 mmol/L (ref 3.5–5.1)
Sodium: 141 mmol/L (ref 135–145)
TCO2: 24 mmol/L (ref 22–32)

## 2021-03-21 LAB — RAPID URINE DRUG SCREEN, HOSP PERFORMED
Amphetamines: NOT DETECTED
Barbiturates: NOT DETECTED
Benzodiazepines: NOT DETECTED
Cocaine: NOT DETECTED
Opiates: NOT DETECTED
Tetrahydrocannabinol: NOT DETECTED

## 2021-03-21 LAB — CBC
HCT: 43.5 % (ref 39.0–52.0)
Hemoglobin: 14 g/dL (ref 13.0–17.0)
MCH: 30.1 pg (ref 26.0–34.0)
MCHC: 32.2 g/dL (ref 30.0–36.0)
MCV: 93.5 fL (ref 80.0–100.0)
Platelets: 329 10*3/uL (ref 150–400)
RBC: 4.65 MIL/uL (ref 4.22–5.81)
RDW: 14.9 % (ref 11.5–15.5)
WBC: 12.4 10*3/uL — ABNORMAL HIGH (ref 4.0–10.5)
nRBC: 0 % (ref 0.0–0.2)

## 2021-03-21 LAB — RESP PANEL BY RT-PCR (FLU A&B, COVID) ARPGX2
Influenza A by PCR: NEGATIVE
Influenza B by PCR: NEGATIVE
SARS Coronavirus 2 by RT PCR: NEGATIVE

## 2021-03-21 LAB — LACTIC ACID, PLASMA
Lactic Acid, Venous: 1.9 mmol/L (ref 0.5–1.9)
Lactic Acid, Venous: 1.7 mmol/L (ref 0.5–1.9)

## 2021-03-21 LAB — TROPONIN I (HIGH SENSITIVITY)
Troponin I (High Sensitivity): 1541 ng/L (ref <18)
Troponin I (High Sensitivity): 1567 ng/L (ref <18)

## 2021-03-21 LAB — AMMONIA: Ammonia: 15 umol/L (ref 9–35)

## 2021-03-21 LAB — TSH: TSH: 0.318 u[IU]/mL — ABNORMAL LOW (ref 0.350–4.500)

## 2021-03-21 LAB — LIPASE, BLOOD: Lipase: 24 U/L (ref 11–51)

## 2021-03-21 LAB — MAGNESIUM: Magnesium: 2 mg/dL (ref 1.7–2.4)

## 2021-03-21 LAB — BRAIN NATRIURETIC PEPTIDE: B Natriuretic Peptide: 525 pg/mL — ABNORMAL HIGH (ref 0.0–100.0)

## 2021-03-21 MED ORDER — ASPIRIN 325 MG PO TABS
325.0000 mg | ORAL_TABLET | Freq: Once | ORAL | Status: AC
  Administered 2021-03-21: 23:00:00 325 mg via ORAL
  Filled 2021-03-21: qty 1

## 2021-03-21 MED ORDER — PIPERACILLIN-TAZOBACTAM 3.375 G IVPB
3.3750 g | Freq: Three times a day (TID) | INTRAVENOUS | Status: DC
  Administered 2021-03-21 – 2021-03-24 (×8): 3.375 g via INTRAVENOUS
  Filled 2021-03-21 (×10): qty 50

## 2021-03-21 MED ORDER — SODIUM CHLORIDE 0.9 % IV BOLUS
500.0000 mL | Freq: Once | INTRAVENOUS | Status: AC
  Administered 2021-03-21: 18:00:00 500 mL via INTRAVENOUS

## 2021-03-21 MED ORDER — IOHEXOL 300 MG/ML  SOLN
100.0000 mL | Freq: Once | INTRAMUSCULAR | Status: AC | PRN
  Administered 2021-03-21: 19:00:00 100 mL via INTRAVENOUS

## 2021-03-21 MED ORDER — FENTANYL CITRATE PF 50 MCG/ML IJ SOSY
50.0000 ug | PREFILLED_SYRINGE | Freq: Once | INTRAMUSCULAR | Status: AC
  Administered 2021-03-21: 19:00:00 50 ug via INTRAVENOUS
  Filled 2021-03-21: qty 1

## 2021-03-21 MED ORDER — FENTANYL CITRATE PF 50 MCG/ML IJ SOSY
25.0000 ug | PREFILLED_SYRINGE | INTRAMUSCULAR | Status: DC | PRN
  Administered 2021-03-22 – 2021-03-23 (×2): 25 ug via INTRAVENOUS
  Filled 2021-03-21 (×2): qty 1

## 2021-03-21 MED ORDER — SODIUM CHLORIDE 0.9 % IV SOLN: INTRAVENOUS | Status: DC

## 2021-03-21 MED ORDER — ACETAMINOPHEN 325 MG PO TABS
650.0000 mg | ORAL_TABLET | Freq: Four times a day (QID) | ORAL | Status: DC | PRN
  Administered 2021-03-27 – 2021-03-29 (×6): 650 mg via ORAL
  Filled 2021-03-21 (×6): qty 2

## 2021-03-21 MED ORDER — ACETAMINOPHEN 650 MG RE SUPP: 650.0000 mg | Freq: Four times a day (QID) | RECTAL | Status: DC | PRN

## 2021-03-21 MED ORDER — ROSUVASTATIN CALCIUM 5 MG PO TABS
5.0000 mg | ORAL_TABLET | Freq: Every day | ORAL | Status: DC
  Administered 2021-03-22 – 2021-03-28 (×7): 5 mg via ORAL
  Filled 2021-03-21 (×7): qty 1

## 2021-03-21 MED ORDER — DILTIAZEM LOAD VIA INFUSION
10.0000 mg | Freq: Once | INTRAVENOUS | Status: AC
  Administered 2021-03-21: 18:00:00 10 mg via INTRAVENOUS
  Filled 2021-03-21: qty 10

## 2021-03-21 MED ORDER — PIPERACILLIN-TAZOBACTAM 3.375 G IVPB 30 MIN
3.3750 g | Freq: Once | INTRAVENOUS | Status: AC
  Administered 2021-03-21: 21:00:00 3.375 g via INTRAVENOUS
  Filled 2021-03-21: qty 50

## 2021-03-21 MED ORDER — NALOXONE HCL 0.4 MG/ML IJ SOLN
0.4000 mg | INTRAMUSCULAR | Status: DC | PRN
  Filled 2021-03-21: qty 1

## 2021-03-21 MED ORDER — DILTIAZEM HCL-DEXTROSE 125-5 MG/125ML-% IV SOLN (PREMIX)
5.0000 mg/h | INTRAVENOUS | Status: DC
  Administered 2021-03-21 – 2021-03-22 (×2): 5 mg/h via INTRAVENOUS
  Administered 2021-03-22: 15 mg/h via INTRAVENOUS
  Filled 2021-03-21 (×3): qty 125

## 2021-03-21 MED ORDER — SODIUM CHLORIDE 0.9 % IV BOLUS
500.0000 mL | Freq: Once | INTRAVENOUS | Status: AC
  Administered 2021-03-21: 21:00:00 500 mL via INTRAVENOUS
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
## 2021-03-21 NOTE — ED Notes (Signed)
Provider made aware of pt critical Troponin.
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
## 2021-03-21 NOTE — Progress Notes (Signed)
Pharmacy Antibiotic Note  Austin Andrade is a 69 y.o. male admitted on 03/21/2021 with intra-abdominal.  Pharmacy has been consulted for Zosyn dosing.  Plan: Zosyn 3.375g IV q8h (4 hour infusion).  Height: 5\' 8"  (172.7 cm) Weight: 59 kg (130 lb) IBW/kg (Calculated) : 68.4  Temp (24hrs), Avg:97.9 F (36.6 C), Min:97.9 F (36.6 C), Max:97.9 F (36.6 C)  Recent Labs  Lab 03/21/21 1649 03/21/21 1655 03/21/21 1722 03/21/21 1855  WBC 12.4*  --   --   --   CREATININE 0.88  --  0.80  --   LATICACIDVEN  --  1.9  --  1.7    Estimated Creatinine Clearance: 70.7 mL/min (by C-G formula based on SCr of 0.8 mg/dL).    No Known Allergies   Thank you for allowing pharmacy to be a part of this patient's care.  [place] Crane, Roylene Reason, BCCP Clinical Pharmacist  03/21/2021 10:28 PM   [facility] pharmacy phone numbers are listed on [email or web address]
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
## 2021-03-21 NOTE — ED Notes (Signed)
RN Aware of pt HR

## 2021-03-21 NOTE — ED Triage Notes (Signed)
[facility] reports pt coming from home c/o weakness and diarrhea. Upon EMS arrival pt heartrate 200. EMS gave 6mg  of Adenosine and 746ml NS. Afib showing on monitor. Pt denies any Afib history. Pt c/o chronic neck pain.

## 2021-03-22 DIAGNOSIS — I4891 Unspecified atrial fibrillation: Secondary | ICD-10-CM

## 2021-03-22 LAB — CBC WITH DIFFERENTIAL/PLATELET
Abs Immature Granulocytes: 0.09 K/uL — ABNORMAL HIGH (ref 0.00–0.07)
Basophils Absolute: 0 K/uL (ref 0.0–0.1)
Basophils Relative: 0 %
Eosinophils Absolute: 0 K/uL (ref 0.0–0.5)
Eosinophils Relative: 0 %
HCT: 43.7 % (ref 39.0–52.0)
Hemoglobin: 14.1 g/dL (ref 13.0–17.0)
Immature Granulocytes: 1 %
Lymphocytes Relative: 15 %
Lymphs Abs: 2.1 K/uL (ref 0.7–4.0)
MCH: 30.6 pg (ref 26.0–34.0)
MCHC: 32.3 g/dL (ref 30.0–36.0)
MCV: 94.8 fL (ref 80.0–100.0)
Monocytes Absolute: 0.7 K/uL (ref 0.1–1.0)
Monocytes Relative: 5 %
Neutro Abs: 10.6 K/uL — ABNORMAL HIGH (ref 1.7–7.7)
Neutrophils Relative %: 79 %
Platelets: 276 K/uL (ref 150–400)
RBC: 4.61 MIL/uL (ref 4.22–5.81)
RDW: 15.1 % (ref 11.5–15.5)
WBC: 13.5 K/uL — ABNORMAL HIGH (ref 4.0–10.5)
nRBC: 0 % (ref 0.0–0.2)

## 2021-03-22 LAB — COMPREHENSIVE METABOLIC PANEL WITH GFR
ALT: 11 U/L (ref 0–44)
AST: 19 U/L (ref 15–41)
Albumin: 3.4 g/dL — ABNORMAL LOW (ref 3.5–5.0)
Alkaline Phosphatase: 102 U/L (ref 38–126)
Anion gap: 10 (ref 5–15)
BUN: 18 mg/dL (ref 8–23)
CO2: 20 mmol/L — ABNORMAL LOW (ref 22–32)
Calcium: 9 mg/dL (ref 8.9–10.3)
Chloride: 107 mmol/L (ref 98–111)
Creatinine, Ser: 0.78 mg/dL (ref 0.61–1.24)
GFR, Estimated: >60 mL/min
Glucose, Bld: 113 mg/dL — ABNORMAL HIGH (ref 70–99)
Potassium: 4 mmol/L (ref 3.5–5.1)
Sodium: 137 mmol/L (ref 135–145)
Total Bilirubin: 0.8 mg/dL (ref 0.3–1.2)
Total Protein: 7.2 g/dL (ref 6.5–8.1)

## 2021-03-22 LAB — URINALYSIS, COMPLETE (UACMP) WITH MICROSCOPIC
Bilirubin Urine: NEGATIVE
Glucose, UA: NEGATIVE mg/dL
Hgb urine dipstick: NEGATIVE
Ketones, ur: NEGATIVE mg/dL
Leukocytes,Ua: NEGATIVE
Nitrite: POSITIVE — AB
Protein, ur: NEGATIVE mg/dL
Specific Gravity, Urine: 1.015 (ref 1.005–1.030)
pH: 5.5 (ref 5.0–8.0)

## 2021-03-22 LAB — PROCALCITONIN: Procalcitonin: <0.1 ng/mL

## 2021-03-22 LAB — MAGNESIUM: Magnesium: 2 mg/dL (ref 1.7–2.4)

## 2021-03-22 LAB — TROPONIN I (HIGH SENSITIVITY)
Troponin I (High Sensitivity): 895 ng/L
Troponin I (High Sensitivity): 1080 ng/L (ref <18)

## 2021-03-22 LAB — ETHANOL: Alcohol, Ethyl (B): <10 mg/dL (ref <10)

## 2021-03-22 MED ORDER — RIVAROXABAN 20 MG PO TABS
20.0000 mg | ORAL_TABLET | Freq: Every day | ORAL | Status: DC
  Administered 2021-03-22 – 2021-03-28 (×7): 20 mg via ORAL
  Filled 2021-03-22 (×7): qty 1

## 2021-03-22 MED ORDER — OXYCODONE HCL 5 MG PO TABS
15.0000 mg | ORAL_TABLET | Freq: Two times a day (BID) | ORAL | Status: DC | PRN
  Administered 2021-03-22 – 2021-03-26 (×4): 15 mg via ORAL
  Filled 2021-03-22 (×4): qty 3

## 2021-03-22 MED ORDER — DULOXETINE HCL 30 MG PO CPEP
30.0000 mg | ORAL_CAPSULE | Freq: Every day | ORAL | Status: DC
  Administered 2021-03-22 – 2021-03-29 (×8): 30 mg via ORAL
  Filled 2021-03-22 (×8): qty 1

## 2021-03-22 MED ORDER — METOPROLOL TARTRATE 12.5 MG HALF TABLET
12.5000 mg | ORAL_TABLET | Freq: Two times a day (BID) | ORAL | Status: DC
  Administered 2021-03-22: 12.5 mg via ORAL
  Filled 2021-03-22: qty 1

## 2021-03-22 MED ORDER — TRAMADOL HCL 50 MG PO TABS
50.0000 mg | ORAL_TABLET | Freq: Four times a day (QID) | ORAL | Status: DC | PRN
  Administered 2021-03-22 – 2021-03-26 (×6): 50 mg via ORAL
  Filled 2021-03-22 (×6): qty 1

## 2021-03-22 MED ORDER — METOPROLOL TARTRATE 25 MG PO TABS
25.0000 mg | ORAL_TABLET | Freq: Two times a day (BID) | ORAL | Status: DC
  Administered 2021-03-22 – 2021-03-23 (×2): 25 mg via ORAL
  Filled 2021-03-22 (×2): qty 1

## 2021-03-22 NOTE — Progress Notes (Signed)
Progress Note    Austin Andrade  WRU:[id number] DOB: October 07, 1950  DOA: 03/21/2021 PCP: Sandi Mariscal, MD    Brief Narrative:    Medical records reviewed and are as summarized below:  Austin Andrade is an 70 y.o. male with medical history significant for paroxysmal atrial fibrillation, essential hypertension, hyperlipidemia, moderate mitral regurgitation, BPH, who is admitted to [facility] on 03/21/2021 with atrial fibrillation with RVR after presenting from home via EMS to [facility] ED complaining of diarrhea.  CT scan suggestive of diverticulitis vs colitis- await stool studies  Assessment/Plan:   Principal Problem:   Atrial fibrillation with RVR (HCC) Active Problems:   Hypertension   Hyperlipidemia with target LDL less than 100   Sepsis (HCC)   Elevated troponin   Leukocytosis   Diarrhea   Generalized weakness   Dehydration   Atrial fibrillation with RVR:  -history of paroxysmal atrial fibrillation, the patient presents today in atrial fibrillation with ventricular rates in the 150s to 160s, with ensuing improvement in heart rates into the 120s to 130s following initiation of diltiazem drip.  -CHA2DS2-VASc score of 5 -TSH low: check free t4   -cards consult -start low dose BB    Sepsis:  - 3 days of diarrhea, which patient reports at least 3-4 episodes per day of loose stool, associated with abdominal pain, swelling in left lower quadrant, with CT abdomen/pelvis showing bowel wall thickening of the sigmoid colon suggestive of diverticulitis versus colitis -Blood cultures x2 collected prior to initiation of IV Zosyn, which will be continued.  -c diff PCR and GI panel by PCR.  Prn IV fentanyl.     Generalized weakness:  -3 days of generalized weakness in the absence of any acute focal neurologic deficits, with CTA showed no evidence of acute intracranial process.  Suspect that this is the basis of physiologic stressors stemming from sepsis with suspected intra-abdominal  infection, as above as well as potential implications of result to atrial fibrillation with RVR, as above, in addition to evidence of mild dehydration. PT/OT   Dehydration:  -recent increase in GI losses stemming from 3 days of loose stool, -recent echocardiogram in July 2022 does not demonstrate any evidence of systolic or diastolic dysfunction, he does have a history of moderate MR moderate aortic regurgitation, increasing risk for ensuing development of acute volume overload.   -gentle IV fluids -Monitor strict I's and O's Daily weights.    Essential Hypertension:  -on BB and Cardizem-- wean gtt per cards   Hyperlipidemia: documented h/o such.  On rosuvastatin as outpatient.     BPH:  - on Flomax as an outpatient. -resume once taking    chronic neck pain -resume home regimen      Family Communication/Anticipated D/C date and plan/Code Status   DVT prophylaxis: xarelto Code Status: dnr  Disposition Plan: Status is: Inpatient  Remains inpatient appropriate because: needs IV abx and control of HR         Medical Consultants:   cards    Subjective:   Asking for his chronic pain meds and the light in the hallway to be turned off  Objective:    Vitals:   03/22/21 1019 03/22/21 1030 03/22/21 1100 03/22/21 1230  BP:  122/66 125/82 105/67  Pulse: (!) 120 68 (!) 102 98  Resp:  (!) 21 19 18   Temp:    98.3 F (36.8 C)  TempSrc:    Oral  SpO2:  99% 96% 97%  Weight:  Height:        Intake/Output Summary (Last 24 hours) at 03/22/2021 1329 Last data filed at 03/21/2021 1814 Gross per 24 hour  Intake 500 ml  Output --  Net 500 ml   Filed Weights   03/21/21 1604  Weight: 59 kg    Exam:  General: Appearance:    Thin male in no acute distress     Lungs:     respirations unlabored  Heart:    Normal heart rate.   MS:   All extremities are intact.    Neurologic:   Awake, alert, pleasant and cooperative     Data Reviewed:   I have personally  reviewed following labs and imaging studies:  Labs: Labs show the following:   Basic Metabolic Panel: Recent Labs  Lab 03/21/21 1649 03/21/21 1722 03/22/21 0418  NA 139 141 137  K 4.1 4.3 4.0  CL 108 108 107  CO2 22  --  20*  GLUCOSE 112* 105* 113*  BUN 25* 26* 18  CREATININE 0.88 0.80 0.78  CALCIUM 9.1  --  9.0  MG 2.0  --  2.0   GFR Estimated Creatinine Clearance: 70.7 mL/min (by C-G formula based on SCr of 0.78 mg/dL). Liver Function Tests: Recent Labs  Lab 03/21/21 1649 03/22/21 0418  AST 24 19  ALT 9 11  ALKPHOS 87 102  BILITOT 0.5 0.8  PROT 6.9 7.2  ALBUMIN 3.3* 3.4*   Recent Labs  Lab 03/21/21 1649  LIPASE 24   Recent Labs  Lab 03/21/21 1655  AMMONIA 15   Coagulation profile No results for input(s): INR, PROTIME in the last 168 hours.  CBC: Recent Labs  Lab 03/21/21 1649 03/21/21 1722 03/22/21 0418  WBC 12.4*  --  13.5*  NEUTROABS  --   --  10.6*  HGB 14.0 15.0 14.1  HCT 43.5 44.0 43.7  MCV 93.5  --  94.8  PLT 329  --  276   Cardiac Enzymes: No results for input(s): CKTOTAL, CKMB, CKMBINDEX, TROPONINI in the last 168 hours. BNP (last 3 results) No results for input(s): PROBNP in the last 8760 hours. CBG: No results for input(s): GLUCAP in the last 168 hours. D-Dimer: No results for input(s): DDIMER in the last 72 hours. Hgb A1c: No results for input(s): HGBA1C in the last 72 hours. Lipid Profile: No results for input(s): CHOL, HDL, LDLCALC, TRIG, CHOLHDL, LDLDIRECT in the last 72 hours. Thyroid function studies: Recent Labs    03/21/21 1652  TSH 0.318*   Anemia work up: No results for input(s): VITAMINB12, FOLATE, FERRITIN, TIBC, IRON, RETICCTPCT in the last 72 hours. Sepsis Labs: Recent Labs  Lab 03/21/21 1649 03/21/21 1655 03/21/21 1855 03/22/21 0418  PROCALCITON  --   --   --  <0.10  WBC 12.4*  --   --  13.5*  LATICACIDVEN  --  1.9 1.7  --     Microbiology Recent Results (from the past 240 hour(s))  Culture,  blood (routine x 2)     Status: None (Preliminary result)   Collection Time: 03/21/21  8:50 PM   Specimen: BLOOD  Result Value Ref Range Status   Specimen Description BLOOD LEFT ARM  Final   Special Requests   Final    BOTTLES DRAWN AEROBIC AND ANAEROBIC Blood Culture results may not be optimal due to an inadequate volume of blood received in culture bottles   Culture   Final    NO GROWTH < 12 HOURS Performed at [facility], [place] [address] [place], [place] [address]    Report Status PENDING  Incomplete  Culture, blood (routine x 2)     Status: None (Preliminary result)   Collection Time: 03/21/21  8:53 PM   Specimen: BLOOD  Result Value Ref Range Status   Specimen Description BLOOD RIGHT ARM  Final   Special Requests   Final    BOTTLES DRAWN AEROBIC AND ANAEROBIC Blood Culture results may not be optimal due to an inadequate volume of blood received in culture bottles   Culture   Final    NO GROWTH < 12 HOURS Performed at [facility], [place] [address] [place], [place] [address]    Report Status PENDING  Incomplete  Resp Panel by RT-PCR (Flu A&B, Covid) Nasopharyngeal Swab     Status: None   Collection Time: 03/21/21  8:59 PM   Specimen: Nasopharyngeal Swab; Nasopharyngeal(NP) swabs in vial transport medium  Result Value Ref Range Status   SARS Coronavirus 2 by RT PCR NEGATIVE NEGATIVE Final    Comment: (NOTE) SARS-CoV-2 target nucleic acids are NOT DETECTED.  The SARS-CoV-2 RNA is generally detectable in upper respiratory specimens during the acute phase of infection. The lowest concentration of SARS-CoV-2 viral copies this assay can detect is 138 copies/mL. A negative result does not preclude SARS-Cov-2 infection and should not be used as the sole basis for treatment or other patient management decisions. A negative result may occur with  improper specimen collection/handling, submission of specimen other than nasopharyngeal swab, presence of viral mutation(s)  within the areas targeted by this assay, and inadequate number of viral copies(<138 copies/mL). A negative result must be combined with clinical observations, patient history, and epidemiological information. The expected result is Negative.  Fact Sheet for Patients:  EntrepreneurPulse.com.au  Fact Sheet for Healthcare Providers:  IncredibleEmployment.be  This test is no t yet approved or cleared by the [place] FDA and  has been authorized for detection and/or diagnosis of SARS-CoV-2 by FDA under an Emergency Use Authorization (EUA). This EUA will remain  in effect (meaning this test can be used) for the duration of the COVID-19 declaration under Section 564(b)(1) of the Act, 21 U.S.C.section 360bbb-3(b)(1), unless the authorization is terminated  or revoked sooner.       Influenza A by PCR NEGATIVE NEGATIVE Final   Influenza B by PCR NEGATIVE NEGATIVE Final    Comment: (NOTE) The Xpert Xpress SARS-CoV-2/FLU/RSV plus assay is intended as an aid in the diagnosis of influenza from Nasopharyngeal swab specimens and should not be used as a sole basis for treatment. Nasal washings and aspirates are unacceptable for Xpert Xpress SARS-CoV-2/FLU/RSV testing.  Fact Sheet for Patients: EntrepreneurPulse.com.au  Fact Sheet for Healthcare Providers: IncredibleEmployment.be  This test is not yet approved or cleared by the [place] FDA and has been authorized for detection and/or diagnosis of SARS-CoV-2 by FDA under an Emergency Use Authorization (EUA). This EUA will remain in effect (meaning this test can be used) for the duration of the COVID-19 declaration under Section 564(b)(1) of the Act, 21 U.S.C. section 360bbb-3(b)(1), unless the authorization is terminated or revoked.  Performed at [facility], [place] [address] [place], [place] [address]     Procedures and diagnostic studies:  CT  HEAD WO CONTRAST (5MM)  Result Date: 03/21/2021 CLINICAL DATA:  Mental status change.  Unknown cause EXAM: CT HEAD WITHOUT CONTRAST TECHNIQUE: Contiguous axial images were obtained from the base of the skull through the vertex without  intravenous contrast. COMPARISON:  MR head 10/31/2020 BRAIN: BRAIN Cerebral ventricle sizes are concordant with the degree of cerebral volume loss. Right occipital lobe and right cerebellar encephalomalacia. No evidence of large-territorial acute infarction. No parenchymal hemorrhage. No mass lesion. No extra-axial collection. No mass effect or midline shift. No hydrocephalus. Basilar cisterns are patent. Vascular: No hyperdense vessel. Atherosclerotic calcifications are present within the cavernous internal carotid and vertebral arteries. Skull: No acute fracture or focal lesion. Sinuses/Orbits: Paranasal sinuses and mastoid air cells are clear. The orbits are unremarkable. Other: None. IMPRESSION: No acute intracranial abnormality. Electronically Signed   By: Iven Finn M.D.   On: 03/21/2021 20:06   CT ABDOMEN PELVIS W CONTRAST  Result Date: 03/21/2021 CLINICAL DATA:  Abdominal pain. EXAM: CT ABDOMEN AND PELVIS WITH CONTRAST TECHNIQUE: Multidetector CT imaging of the abdomen and pelvis was performed using the standard protocol following bolus administration of intravenous contrast. CONTRAST:  143mL OMNIPAQUE IOHEXOL 300 MG/ML  SOLN COMPARISON:  May 24, 2019 FINDINGS: Lower chest: Partially visualized emphysematous changes. Hepatobiliary: No focal liver abnormality is seen. Cholelithiasis without CT evidence of cholecystitis. Pancreas: Atrophic appearance of the pancreas. Two coarse calcifications in the head of the pancreas, near the ampulla. No significant pancreatic duct dilation. Spleen: Normal in size without focal abnormality. Adrenals/Urinary Tract: Adrenal glands are unremarkable. Kidneys are normal, without renal calculi, focal lesion, or hydronephrosis. Mild  urinary bladder wall thickening. Layering punctate calcifications within the right hand side of the urinary bladder. Stomach/Bowel: Stomach is within normal limits. Appendix appears normal. No evidence of small bowel wall thickening, distention, or inflammatory changes. Scattered colonic diverticulosis. Long segment of circumferential mucosal thickening of the sigmoid colon may represent colitis or diverticulitis. Vascular/Lymphatic: No significant vascular findings are present. No enlarged abdominal or pelvic lymph nodes. Reproductive: Prostate is unremarkable. Other: No abdominal wall hernia or abnormality. No abdominopelvic ascites. Musculoskeletal: Spondylosis of the lumbosacral spine. IMPRESSION: 1. Long segment of circumferential mucosal thickening of the sigmoid colon, which may represent colitis or diverticulitis. 2. Cholelithiasis without CT evidence of cholecystitis. 3. Two coarse calcifications in the head of the pancreas, near the ampulla. No significant pancreatic duct dilation. These findings may represent sequela of chronic pancreatitis. 4. Layering punctate calcifications within the right hand side of the urinary bladder. Query recent ureteral calculi passage. 5. Partially visualized emphysematous changes in the lung bases. Emphysema (ICD10-J43.9). Electronically Signed   By: Fidela Salisbury M.D.   On: 03/21/2021 20:06   DG Chest Port 1 View  Result Date: 03/21/2021 CLINICAL DATA:  AFib.  Dizziness. EXAM: PORTABLE CHEST 1 VIEW COMPARISON:  November 07, 2020 FINDINGS: Calcific atherosclerotic disease and tortuosity of the aorta. Cardiomediastinal silhouette is normal. Mediastinal contours appear intact. There is no evidence of focal airspace consolidation, pleural effusion or pneumothorax. Chronic interstitial lung changes. Osseous structures are without acute abnormality. Lower cervical spine fusion. Soft tissues are grossly normal. IMPRESSION: 1. No active disease. 2. Chronic interstitial lung  changes. Electronically Signed   By: Fidela Salisbury M.D.   On: 03/21/2021 19:22    Medications:    metoprolol tartrate  12.5 mg Oral BID   rivaroxaban  20 mg Oral Q supper   rosuvastatin  5 mg Oral Q supper   Continuous Infusions:  diltiazem (CARDIZEM) infusion 5 mg/hr (03/22/21 1139)   piperacillin-tazobactam (ZOSYN)  IV Stopped (03/22/21 0910)     LOS: 1 day   Geradine Girt  [facility]   How to contact the [facility] Attending or Consulting provider 7A -  7P or covering provider during after hours [place], for this patient?  Check the care team in [facility] and look for a) attending/consulting [facility] provider listed and b) the [facility] team listed Log into [email or web address] and use [place]'s universal password to access. If you do not have the password, please contact the hospital operator. Locate the [facility] provider you are looking for under [facility] and page to a number that you can be directly reached. If you still have difficulty reaching the provider, please page the [facility] (Director on Call) for the Hospitalists listed on amion for assistance.  03/22/2021, 1:29 PM
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
## 2021-03-22 NOTE — Progress Notes (Signed)
Admission from the ED by bed awake and alert. 

## 2021-03-22 NOTE — Progress Notes (Signed)
Progress Note  Patient Name: Austin Andrade Date of Encounter: 03/22/2021  [facility] Cardiologist: None previously Dr. Aundra Dubin last visit in 2015  Subjective   Not much to say.  Denies any chest pain.  Does not complain of any discomfort  Inpatient Medications    Scheduled Meds:  metoprolol tartrate  12.5 mg Oral BID   rivaroxaban  20 mg Oral Q supper   rosuvastatin  5 mg Oral Q supper   Continuous Infusions:  diltiazem (CARDIZEM) infusion 5 mg/hr (03/22/21 1022)   piperacillin-tazobactam (ZOSYN)  IV Stopped (03/22/21 0910)   PRN Meds: acetaminophen **OR** acetaminophen, fentaNYL (SUBLIMAZE) injection, naLOXone (NARCAN)  injection, oxyCODONE, traMADol   Vital Signs    Vitals:   03/22/21 1000 03/22/21 1019 03/22/21 1030 03/22/21 1100  BP: 123/68  122/66 125/82  Pulse: 90 (!) 120 68 (!) 102  Resp: (!) 25  (!) 21 19  Temp:      TempSrc:      SpO2: 100%  99% 96%  Weight:      Height:        Intake/Output Summary (Last 24 hours) at 03/22/2021 1129 Last data filed at 03/21/2021 1814 Gross per 24 hour  Intake 500 ml  Output --  Net 500 ml   Last 3 Weights 03/21/2021 12/22/2020 11/11/2020  Weight (lbs) 130 lb 118 lb 136 lb 11 oz  Weight (kg) 58.968 kg 53.524 kg 62 kg      Telemetry    Atrial fibrillation heart rate ranging from 80-130 currently in the 90s- Personally Reviewed  ECG    Atrial fibrillation with rapid ventricular sponsor heart rate 155 bpm- Personally Reviewed  Physical Exam   GEN: No acute distress.  Washcloth on forehead.  Somewhat disheveled Neck: No JVD Cardiac: Irregularly irregular normal rate, 1/6 systolic murmur, norubs, or gallops.  Respiratory: Clear to auscultation bilaterally. GI: Soft, nontender, non-distended  MS: No edema; No deformity. Neuro:  Nonfocal  Psych: Normal affect   Labs    High Sensitivity Troponin:   Recent Labs  Lab 03/21/21 1649 03/21/21 1859 03/22/21 0100 03/22/21 0435  TROPONINIHS 1,541* 1,567* 895*  1,080*     Chemistry Recent Labs  Lab 03/21/21 1649 03/21/21 1722 03/22/21 0418  NA 139 141 137  K 4.1 4.3 4.0  CL 108 108 107  CO2 22  --  20*  GLUCOSE 112* 105* 113*  BUN 25* 26* 18  CREATININE 0.88 0.80 0.78  CALCIUM 9.1  --  9.0  MG 2.0  --  2.0  PROT 6.9  --  7.2  ALBUMIN 3.3*  --  3.4*  AST 24  --  19  ALT 9  --  11  ALKPHOS 87  --  102  BILITOT 0.5  --  0.8  GFRNONAA >60  --  >60  ANIONGAP 9  --  10    Lipids No results for input(s): CHOL, TRIG, HDL, LABVLDL, LDLCALC, CHOLHDL in the last 168 hours.  Hematology Recent Labs  Lab 03/21/21 1649 03/21/21 1722 03/22/21 0418  WBC 12.4*  --  13.5*  RBC 4.65  --  4.61  HGB 14.0 15.0 14.1  HCT 43.5 44.0 43.7  MCV 93.5  --  94.8  MCH 30.1  --  30.6  MCHC 32.2  --  32.3  RDW 14.9  --  15.1  PLT 329  --  276   Thyroid  Recent Labs  Lab 03/21/21 1652  TSH 0.318*    BNP Recent Labs  Lab  03/21/21 1652  BNP 525.0*    DDimer No results for input(s): DDIMER in the last 168 hours.   Radiology    CT HEAD WO CONTRAST (5MM)  Result Date: 03/21/2021 CLINICAL DATA:  Mental status change.  Unknown cause EXAM: CT HEAD WITHOUT CONTRAST TECHNIQUE: Contiguous axial images were obtained from the base of the skull through the vertex without intravenous contrast. COMPARISON:  MR head 10/31/2020 BRAIN: BRAIN Cerebral ventricle sizes are concordant with the degree of cerebral volume loss. Right occipital lobe and right cerebellar encephalomalacia. No evidence of large-territorial acute infarction. No parenchymal hemorrhage. No mass lesion. No extra-axial collection. No mass effect or midline shift. No hydrocephalus. Basilar cisterns are patent. Vascular: No hyperdense vessel. Atherosclerotic calcifications are present within the cavernous internal carotid and vertebral arteries. Skull: No acute fracture or focal lesion. Sinuses/Orbits: Paranasal sinuses and mastoid air cells are clear. The orbits are unremarkable. Other: None.  IMPRESSION: No acute intracranial abnormality. Electronically Signed   By: Iven Finn M.D.   On: 03/21/2021 20:06   CT ABDOMEN PELVIS W CONTRAST  Result Date: 03/21/2021 CLINICAL DATA:  Abdominal pain. EXAM: CT ABDOMEN AND PELVIS WITH CONTRAST TECHNIQUE: Multidetector CT imaging of the abdomen and pelvis was performed using the standard protocol following bolus administration of intravenous contrast. CONTRAST:  130mL OMNIPAQUE IOHEXOL 300 MG/ML  SOLN COMPARISON:  May 24, 2019 FINDINGS: Lower chest: Partially visualized emphysematous changes. Hepatobiliary: No focal liver abnormality is seen. Cholelithiasis without CT evidence of cholecystitis. Pancreas: Atrophic appearance of the pancreas. Two coarse calcifications in the head of the pancreas, near the ampulla. No significant pancreatic duct dilation. Spleen: Normal in size without focal abnormality. Adrenals/Urinary Tract: Adrenal glands are unremarkable. Kidneys are normal, without renal calculi, focal lesion, or hydronephrosis. Mild urinary bladder wall thickening. Layering punctate calcifications within the right hand side of the urinary bladder. Stomach/Bowel: Stomach is within normal limits. Appendix appears normal. No evidence of small bowel wall thickening, distention, or inflammatory changes. Scattered colonic diverticulosis. Long segment of circumferential mucosal thickening of the sigmoid colon may represent colitis or diverticulitis. Vascular/Lymphatic: No significant vascular findings are present. No enlarged abdominal or pelvic lymph nodes. Reproductive: Prostate is unremarkable. Other: No abdominal wall hernia or abnormality. No abdominopelvic ascites. Musculoskeletal: Spondylosis of the lumbosacral spine. IMPRESSION: 1. Long segment of circumferential mucosal thickening of the sigmoid colon, which may represent colitis or diverticulitis. 2. Cholelithiasis without CT evidence of cholecystitis. 3. Two coarse calcifications in the head of  the pancreas, near the ampulla. No significant pancreatic duct dilation. These findings may represent sequela of chronic pancreatitis. 4. Layering punctate calcifications within the right hand side of the urinary bladder. Query recent ureteral calculi passage. 5. Partially visualized emphysematous changes in the lung bases. Emphysema (ICD10-J43.9). Electronically Signed   By: Fidela Salisbury M.D.   On: 03/21/2021 20:06   DG Chest Port 1 View  Result Date: 03/21/2021 CLINICAL DATA:  AFib.  Dizziness. EXAM: PORTABLE CHEST 1 VIEW COMPARISON:  November 07, 2020 FINDINGS: Calcific atherosclerotic disease and tortuosity of the aorta. Cardiomediastinal silhouette is normal. Mediastinal contours appear intact. There is no evidence of focal airspace consolidation, pleural effusion or pneumothorax. Chronic interstitial lung changes. Osseous structures are without acute abnormality. Lower cervical spine fusion. Soft tissues are grossly normal. IMPRESSION: 1. No active disease. 2. Chronic interstitial lung changes. Electronically Signed   By: Fidela Salisbury M.D.   On: 03/21/2021 19:22    Cardiac Studies   Transesophageal echocardiogram 11/06/2020-EF normal moderate mitral regurgitation  Patient  Profile     70 y.o. male here with atrial fibrillation rapid ventricular response with troponin elevation compatible with demand ischemia  Assessment & Plan    Troponin elevation - Likely demand ischemia in the setting of atrial fibrillation with rapid ventricular response.  Overall trended down.  Has active colitis on CT scan which likely triggered this atrial fibrillation.  Certainly could have underlying coronary artery disease.  He is asymptomatic with this atrial fibrillation.  No signs of heart failure. - Continue with rivaroxaban  Coronary atherosclerosis - Seen on prior CT scan in July.  Agree with Crestor 5 mg tablet.  Atrial fibrillation with rapid ventricular response - Agree with diltiazem drip.   Xarelto.  Also on low-dose metoprolol.  Possible colitis - On Zosyn which was stopped earlier this morning.    For questions or updates, please contact [place] Please consult [email or web address] for contact info under        Signed, Candee Furbish, MD  03/22/2021, 11:29 AM

## 2021-03-22 NOTE — ED Notes (Addendum)
ORTHOSTATIC VITALS:  BP: 116/80 HR: 113 (Laying flat) BP: 121/104    HR: 107 sitting **patient has a broken left leg, per patient (left leg currently in boot).  Can not stand for long periods of time.

## 2021-03-22 NOTE — Progress Notes (Signed)
No bm noted the whole shift.

## 2021-03-23 LAB — CBC
HCT: 37.4 % — ABNORMAL LOW (ref 39.0–52.0)
Hemoglobin: 12.2 g/dL — ABNORMAL LOW (ref 13.0–17.0)
MCH: 30.4 pg (ref 26.0–34.0)
MCHC: 32.6 g/dL (ref 30.0–36.0)
MCV: 93.3 fL (ref 80.0–100.0)
Platelets: 253 10*3/uL (ref 150–400)
RBC: 4.01 MIL/uL — ABNORMAL LOW (ref 4.22–5.81)
RDW: 14.7 % (ref 11.5–15.5)
WBC: 12.2 10*3/uL — ABNORMAL HIGH (ref 4.0–10.5)
nRBC: 0 % (ref 0.0–0.2)

## 2021-03-23 LAB — BASIC METABOLIC PANEL
Anion gap: 8 (ref 5–15)
BUN: 17 mg/dL (ref 8–23)
CO2: 21 mmol/L — ABNORMAL LOW (ref 22–32)
Calcium: 9 mg/dL (ref 8.9–10.3)
Chloride: 106 mmol/L (ref 98–111)
Creatinine, Ser: 0.88 mg/dL (ref 0.61–1.24)
GFR, Estimated: >60 mL/min (ref >60)
Glucose, Bld: 119 mg/dL — ABNORMAL HIGH (ref 70–99)
Potassium: 4.3 mmol/L (ref 3.5–5.1)
Sodium: 135 mmol/L (ref 135–145)

## 2021-03-23 LAB — T4, FREE: Free T4: 0.91 ng/dL (ref 0.61–1.12)

## 2021-03-23 MED ORDER — METOPROLOL TARTRATE 50 MG PO TABS
50.0000 mg | ORAL_TABLET | Freq: Two times a day (BID) | ORAL | Status: DC
  Administered 2021-03-23 – 2021-03-29 (×12): 50 mg via ORAL
  Filled 2021-03-23 (×12): qty 1

## 2021-03-23 MED ORDER — TAMSULOSIN HCL 0.4 MG PO CAPS
0.4000 mg | ORAL_CAPSULE | Freq: Every evening | ORAL | Status: DC
  Administered 2021-03-23 – 2021-03-28 (×6): 0.4 mg via ORAL
  Filled 2021-03-23 (×6): qty 1

## 2021-03-23 NOTE — Progress Notes (Addendum)
Progress Note    Austin Andrade  AJG:[id number] DOB: September 28, 1950  DOA: 03/21/2021 PCP: Sandi Mariscal, MD    Brief Narrative:    Medical records reviewed and are as summarized below:  Austin Andrade is an 70 y.o. male with medical history significant for paroxysmal atrial fibrillation, essential hypertension, hyperlipidemia, moderate mitral regurgitation, BPH, who is admitted to [facility] on 03/21/2021 with atrial fibrillation with RVR after presenting from home via EMS to [facility] ED complaining of diarrhea.  CT scan suggestive of diverticulitis vs colitis- no further stools since admission.  Denies having colonoscopy ever.    Assessment/Plan:   Principal Problem:   Atrial fibrillation with RVR (HCC) Active Problems:   Hypertension   Hyperlipidemia with target LDL less than 100   Sepsis (HCC)   Elevated troponin   Leukocytosis   Diarrhea   Generalized weakness   Dehydration   Atrial fibrillation with RVR:  -history of paroxysmal atrial fibrillation, the patient presents today in atrial fibrillation with ventricular rates in the 150s to 160s, with ensuing improvement in heart rates into the 120s to 130s following initiation of diltiazem drip.  -CHA2DS2-VASc score of 5 -TSH low:  free t4 normal  -cards consult appreciated -resumed home BB   Sepsis:  - 3 days of diarrhea, which patient reports at least 3-4 episodes per day of loose stool, associated with abdominal pain, swelling in left lower quadrant, with CT abdomen/pelvis showing bowel wall thickening of the sigmoid colon suggestive of diverticulitis versus colitis -Blood cultures x2 collected prior to initiation of IV Zosyn, which will be continued. - de-escalate to augmentin once tolerating soft diet -no further stools -will treat like diverticulitis-- never had colonoscopy-- refer to GI once recovered -advance diet   Generalized weakness:  -3 days of generalized weakness in the absence of any acute focal neurologic  deficits, with CTA showed no evidence of acute intracranial process.  Suspect that this is the basis of physiologic stressors stemming from sepsis with suspected intra-abdominal infection, as above as well as potential implications of result to atrial fibrillation with RVR, as above, in addition to evidence of mild dehydration. PT/OT   Dehydration:  -recent increase in GI losses stemming from 3 days of loose stool, -resolved   Essential Hypertension:  -on BB    Hyperlipidemia: documented h/o such.  On rosuvastatin as outpatient.     BPH:  - on Flomax as an outpatient.    chronic neck pain -resume home regimen    PT eval pending-- recent leg fracture and is in boot   Family Communication/Anticipated D/C date and plan/Code Status   DVT prophylaxis: xarelto Code Status: dnr Disposition Plan: Status is: Inpatient  Remains inpatient appropriate because: needs IV abx and diet advancement          Medical Consultants:   cards    Subjective:   No abdominal pain, hungry, no further BMs  Objective:    Vitals:   03/22/21 2259 03/23/21 0315 03/23/21 0549 03/23/21 0856  BP: 123/75 127/75  132/78  Pulse: 74 70  71  Resp: 18 17  15   Temp: 98.5 F (36.9 C) 98 F (36.7 C)  98.5 F (36.9 C)  TempSrc: Oral Oral  Oral  SpO2: 94% 93%  98%  Weight:   62.7 kg   Height:        Intake/Output Summary (Last 24 hours) at 03/23/2021 0938 Last data filed at 03/23/2021 0300 Gross per 24 hour  Intake 1463.17 ml  Output --  Net 1463.17 ml   Filed Weights   03/21/21 1604 03/23/21 0549  Weight: 59 kg 62.7 kg    Exam:  General: Appearance:    Thin male in no acute distress     Lungs:     respirations unlabored  Heart:    Normal heart rate.   MS:   All extremities are intact.    Neurologic:   Awake, alert, pleasant and cooperative     Data Reviewed:   I have personally reviewed following labs and imaging studies:  Labs: Labs show the following:   Basic  Metabolic Panel: Recent Labs  Lab 03/21/21 1649 03/21/21 1722 03/22/21 0418 03/23/21 0119  NA 139 141 137 135  K 4.1 4.3 4.0 4.3  CL 108 108 107 106  CO2 22  --  20* 21*  GLUCOSE 112* 105* 113* 119*  BUN 25* 26* 18 17  CREATININE 0.88 0.80 0.78 0.88  CALCIUM 9.1  --  9.0 9.0  MG 2.0  --  2.0  --    GFR Estimated Creatinine Clearance: 68.3 mL/min (by C-G formula based on SCr of 0.88 mg/dL). Liver Function Tests: Recent Labs  Lab 03/21/21 1649 03/22/21 0418  AST 24 19  ALT 9 11  ALKPHOS 87 102  BILITOT 0.5 0.8  PROT 6.9 7.2  ALBUMIN 3.3* 3.4*   Recent Labs  Lab 03/21/21 1649  LIPASE 24   Recent Labs  Lab 03/21/21 1655  AMMONIA 15   Coagulation profile No results for input(s): INR, PROTIME in the last 168 hours.  CBC: Recent Labs  Lab 03/21/21 1649 03/21/21 1722 03/22/21 0418 03/23/21 0119  WBC 12.4*  --  13.5* 12.2*  NEUTROABS  --   --  10.6*  --   HGB 14.0 15.0 14.1 12.2*  HCT 43.5 44.0 43.7 37.4*  MCV 93.5  --  94.8 93.3  PLT 329  --  276 253   Cardiac Enzymes: No results for input(s): CKTOTAL, CKMB, CKMBINDEX, TROPONINI in the last 168 hours. BNP (last 3 results) No results for input(s): PROBNP in the last 8760 hours. CBG: No results for input(s): GLUCAP in the last 168 hours. D-Dimer: No results for input(s): DDIMER in the last 72 hours. Hgb A1c: No results for input(s): HGBA1C in the last 72 hours. Lipid Profile: No results for input(s): CHOL, HDL, LDLCALC, TRIG, CHOLHDL, LDLDIRECT in the last 72 hours. Thyroid function studies: Recent Labs    03/21/21 1652  TSH 0.318*   Anemia work up: No results for input(s): VITAMINB12, FOLATE, FERRITIN, TIBC, IRON, RETICCTPCT in the last 72 hours. Sepsis Labs: Recent Labs  Lab 03/21/21 1649 03/21/21 1655 03/21/21 1855 03/22/21 0418 03/23/21 0119  PROCALCITON  --   --   --  <0.10  --   WBC 12.4*  --   --  13.5* 12.2*  LATICACIDVEN  --  1.9 1.7  --   --     Microbiology Recent Results  (from the past 240 hour(s))  Culture, blood (routine x 2)     Status: None (Preliminary result)   Collection Time: 03/21/21  8:50 PM   Specimen: BLOOD LEFT ARM  Result Value Ref Range Status   Specimen Description BLOOD LEFT ARM  Final   Special Requests   Final    BOTTLES DRAWN AEROBIC AND ANAEROBIC Blood Culture results may not be optimal due to an inadequate volume of blood received in culture bottles   Culture   Final    NO GROWTH 2 DAYS Performed  at [facility], [place] [address], [place], [place] [address]    Report Status PENDING  Incomplete  Culture, blood (routine x 2)     Status: None (Preliminary result)   Collection Time: 03/21/21  8:53 PM   Specimen: BLOOD RIGHT ARM  Result Value Ref Range Status   Specimen Description BLOOD RIGHT ARM  Final   Special Requests   Final    BOTTLES DRAWN AEROBIC AND ANAEROBIC Blood Culture results may not be optimal due to an inadequate volume of blood received in culture bottles   Culture   Final    NO GROWTH 2 DAYS Performed at [facility], [place] [address] [place], [place] [address]    Report Status PENDING  Incomplete  Resp Panel by RT-PCR (Flu A&B, Covid) Nasopharyngeal Swab     Status: None   Collection Time: 03/21/21  8:59 PM   Specimen: Nasopharyngeal Swab; Nasopharyngeal(NP) swabs in vial transport medium  Result Value Ref Range Status   SARS Coronavirus 2 by RT PCR NEGATIVE NEGATIVE Final    Comment: (NOTE) SARS-CoV-2 target nucleic acids are NOT DETECTED.  The SARS-CoV-2 RNA is generally detectable in upper respiratory specimens during the acute phase of infection. The lowest concentration of SARS-CoV-2 viral copies this assay can detect is 138 copies/mL. A negative result does not preclude SARS-Cov-2 infection and should not be used as the sole basis for treatment or other patient management decisions. A negative result may occur with  improper specimen collection/handling, submission of specimen other than  nasopharyngeal swab, presence of viral mutation(s) within the areas targeted by this assay, and inadequate number of viral copies(<138 copies/mL). A negative result must be combined with clinical observations, patient history, and epidemiological information. The expected result is Negative.  Fact Sheet for Patients:  EntrepreneurPulse.com.au  Fact Sheet for Healthcare Providers:  IncredibleEmployment.be  This test is no t yet approved or cleared by the [place] FDA and  has been authorized for detection and/or diagnosis of SARS-CoV-2 by FDA under an Emergency Use Authorization (EUA). This EUA will remain  in effect (meaning this test can be used) for the duration of the COVID-19 declaration under Section 564(b)(1) of the Act, 21 U.S.C.section 360bbb-3(b)(1), unless the authorization is terminated  or revoked sooner.       Influenza A by PCR NEGATIVE NEGATIVE Final   Influenza B by PCR NEGATIVE NEGATIVE Final    Comment: (NOTE) The Xpert Xpress SARS-CoV-2/FLU/RSV plus assay is intended as an aid in the diagnosis of influenza from Nasopharyngeal swab specimens and should not be used as a sole basis for treatment. Nasal washings and aspirates are unacceptable for Xpert Xpress SARS-CoV-2/FLU/RSV testing.  Fact Sheet for Patients: EntrepreneurPulse.com.au  Fact Sheet for Healthcare Providers: IncredibleEmployment.be  This test is not yet approved or cleared by the [place] FDA and has been authorized for detection and/or diagnosis of SARS-CoV-2 by FDA under an Emergency Use Authorization (EUA). This EUA will remain in effect (meaning this test can be used) for the duration of the COVID-19 declaration under Section 564(b)(1) of the Act, 21 U.S.C. section 360bbb-3(b)(1), unless the authorization is terminated or revoked.  Performed at [facility], [place] [address], [place], [place] [address]      Procedures and diagnostic studies:  CT HEAD WO CONTRAST (5MM)  Result Date: 03/21/2021 CLINICAL DATA:  Mental status change.  Unknown cause EXAM: CT HEAD WITHOUT CONTRAST TECHNIQUE: Contiguous axial images were obtained from the base of the skull  through the vertex without intravenous contrast. COMPARISON:  MR head 10/31/2020 BRAIN: BRAIN Cerebral ventricle sizes are concordant with the degree of cerebral volume loss. Right occipital lobe and right cerebellar encephalomalacia. No evidence of large-territorial acute infarction. No parenchymal hemorrhage. No mass lesion. No extra-axial collection. No mass effect or midline shift. No hydrocephalus. Basilar cisterns are patent. Vascular: No hyperdense vessel. Atherosclerotic calcifications are present within the cavernous internal carotid and vertebral arteries. Skull: No acute fracture or focal lesion. Sinuses/Orbits: Paranasal sinuses and mastoid air cells are clear. The orbits are unremarkable. Other: None. IMPRESSION: No acute intracranial abnormality. Electronically Signed   By: Iven Finn M.D.   On: 03/21/2021 20:06   CT ABDOMEN PELVIS W CONTRAST  Result Date: 03/21/2021 CLINICAL DATA:  Abdominal pain. EXAM: CT ABDOMEN AND PELVIS WITH CONTRAST TECHNIQUE: Multidetector CT imaging of the abdomen and pelvis was performed using the standard protocol following bolus administration of intravenous contrast. CONTRAST:  147mL OMNIPAQUE IOHEXOL 300 MG/ML  SOLN COMPARISON:  May 24, 2019 FINDINGS: Lower chest: Partially visualized emphysematous changes. Hepatobiliary: No focal liver abnormality is seen. Cholelithiasis without CT evidence of cholecystitis. Pancreas: Atrophic appearance of the pancreas. Two coarse calcifications in the head of the pancreas, near the ampulla. No significant pancreatic duct dilation. Spleen: Normal in size without focal abnormality. Adrenals/Urinary Tract: Adrenal glands are unremarkable. Kidneys are normal, without renal  calculi, focal lesion, or hydronephrosis. Mild urinary bladder wall thickening. Layering punctate calcifications within the right hand side of the urinary bladder. Stomach/Bowel: Stomach is within normal limits. Appendix appears normal. No evidence of small bowel wall thickening, distention, or inflammatory changes. Scattered colonic diverticulosis. Long segment of circumferential mucosal thickening of the sigmoid colon may represent colitis or diverticulitis. Vascular/Lymphatic: No significant vascular findings are present. No enlarged abdominal or pelvic lymph nodes. Reproductive: Prostate is unremarkable. Other: No abdominal wall hernia or abnormality. No abdominopelvic ascites. Musculoskeletal: Spondylosis of the lumbosacral spine. IMPRESSION: 1. Long segment of circumferential mucosal thickening of the sigmoid colon, which may represent colitis or diverticulitis. 2. Cholelithiasis without CT evidence of cholecystitis. 3. Two coarse calcifications in the head of the pancreas, near the ampulla. No significant pancreatic duct dilation. These findings may represent sequela of chronic pancreatitis. 4. Layering punctate calcifications within the right hand side of the urinary bladder. Query recent ureteral calculi passage. 5. Partially visualized emphysematous changes in the lung bases. Emphysema (ICD10-J43.9). Electronically Signed   By: Fidela Salisbury M.D.   On: 03/21/2021 20:06   DG Chest Port 1 View  Result Date: 03/21/2021 CLINICAL DATA:  AFib.  Dizziness. EXAM: PORTABLE CHEST 1 VIEW COMPARISON:  November 07, 2020 FINDINGS: Calcific atherosclerotic disease and tortuosity of the aorta. Cardiomediastinal silhouette is normal. Mediastinal contours appear intact. There is no evidence of focal airspace consolidation, pleural effusion or pneumothorax. Chronic interstitial lung changes. Osseous structures are without acute abnormality. Lower cervical spine fusion. Soft tissues are grossly normal. IMPRESSION: 1. No  active disease. 2. Chronic interstitial lung changes. Electronically Signed   By: Fidela Salisbury M.D.   On: 03/21/2021 19:22    Medications:    DULoxetine  30 mg Oral Daily   metoprolol tartrate  50 mg Oral BID   rivaroxaban  20 mg Oral Q supper   rosuvastatin  5 mg Oral Q supper   Continuous Infusions:  piperacillin-tazobactam (ZOSYN)  IV 3.375 g (03/23/21 0544)     LOS: 2 days   Geradine Girt  [facility]   How to contact the [facility] Attending  or Consulting provider [place] or covering provider during after hours [place], for this patient?  Check the care team in [facility] and look for a) attending/consulting [facility] provider listed and b) the [facility] team listed Log into [email or web address] and use [place]'s universal password to access. If you do not have the password, please contact the hospital operator. Locate the [facility] provider you are looking for under [facility] and page to a number that you can be directly reached. If you still have difficulty reaching the provider, please page the [facility] (Director on Call) for the Hospitalists listed on amion for assistance.  03/23/2021, 9:38 AM

## 2021-03-23 NOTE — Progress Notes (Signed)
Progress Note  Patient Name: Austin Andrade Date of Encounter: 03/23/2021  [place] Cardiologist: None previously Dr. Aundra Dubin last visit in 2015  Subjective   No chest pain no shortness of breath.  Converted to normal sinus rhythm.  Diltiazem drip is off.  Inpatient Medications    Scheduled Meds:  DULoxetine  30 mg Oral Daily   metoprolol tartrate  25 mg Oral BID   rivaroxaban  20 mg Oral Q supper   rosuvastatin  5 mg Oral Q supper   Continuous Infusions:  diltiazem (CARDIZEM) infusion Stopped (03/22/21 1514)   piperacillin-tazobactam (ZOSYN)  IV 3.375 g (03/23/21 0544)   PRN Meds: acetaminophen **OR** acetaminophen, fentaNYL (SUBLIMAZE) injection, naLOXone (NARCAN)  injection, oxyCODONE, traMADol   Vital Signs    Vitals:   03/22/21 2259 03/23/21 0315 03/23/21 0549 03/23/21 0856  BP: 123/75 127/75  132/78  Pulse: 74 70  71  Resp: 18 17  15   Temp: 98.5 F (36.9 C) 98 F (36.7 C)  98.5 F (36.9 C)  TempSrc: Oral Oral  Oral  SpO2: 94% 93%  98%  Weight:   62.7 kg   Height:        Intake/Output Summary (Last 24 hours) at 03/23/2021 0912 Last data filed at 03/23/2021 0300 Gross per 24 hour  Intake 1463.17 ml  Output --  Net 1463.17 ml   Last 3 Weights 03/23/2021 03/21/2021 12/22/2020  Weight (lbs) 138 lb 3.7 oz 130 lb 118 lb  Weight (kg) 62.7 kg 58.968 kg 53.524 kg      Telemetry    Now in normal sinus rhythm.  - Personally Reviewed  ECG    Atrial fibrillation with rapid ventricular sponsor heart rate 155 bpm- Personally Reviewed  Physical Exam   GEN: Well nourished, well developed, in no acute distress, somewhat disheveled HEENT: normal Neck: no JVD, carotid bruits, or masses Cardiac: RRR; 1/6 holosystolic murmur no rubs, or gallops,no edema  Respiratory:  clear to auscultation bilaterally, normal work of breathing GI: soft, nontender, nondistended, + BS MS: no deformity or atrophy Skin: warm and dry, no rash Neuro:  Alert and Oriented x 3, Strength  and sensation are intact Psych: euthymic mood, full affect   Labs    High Sensitivity Troponin:   Recent Labs  Lab 03/21/21 1649 03/21/21 1859 03/22/21 0100 03/22/21 0435  TROPONINIHS 1,541* 1,567* 895* 1,080*     Chemistry Recent Labs  Lab 03/21/21 1649 03/21/21 1722 03/22/21 0418 03/23/21 0119  NA 139 141 137 135  K 4.1 4.3 4.0 4.3  CL 108 108 107 106  CO2 22  --  20* 21*  GLUCOSE 112* 105* 113* 119*  BUN 25* 26* 18 17  CREATININE 0.88 0.80 0.78 0.88  CALCIUM 9.1  --  9.0 9.0  MG 2.0  --  2.0  --   PROT 6.9  --  7.2  --   ALBUMIN 3.3*  --  3.4*  --   AST 24  --  19  --   ALT 9  --  11  --   ALKPHOS 87  --  102  --   BILITOT 0.5  --  0.8  --   GFRNONAA >60  --  >60 >60  ANIONGAP 9  --  10 8    Lipids No results for input(s): CHOL, TRIG, HDL, LABVLDL, LDLCALC, CHOLHDL in the last 168 hours.  Hematology Recent Labs  Lab 03/21/21 1649 03/21/21 1722 03/22/21 0418 03/23/21 0119  WBC 12.4*  --  13.5* 12.2*  RBC 4.65  --  4.61 4.01*  HGB 14.0 15.0 14.1 12.2*  HCT 43.5 44.0 43.7 37.4*  MCV 93.5  --  94.8 93.3  MCH 30.1  --  30.6 30.4  MCHC 32.2  --  32.3 32.6  RDW 14.9  --  15.1 14.7  PLT 329  --  276 253   Thyroid  Recent Labs  Lab 03/21/21 1652 03/23/21 0119  TSH 0.318*  --   FREET4  --  0.91    BNP Recent Labs  Lab 03/21/21 1652  BNP 525.0*    DDimer No results for input(s): DDIMER in the last 168 hours.   Radiology    CT HEAD WO CONTRAST (5MM)  Result Date: 03/21/2021 CLINICAL DATA:  Mental status change.  Unknown cause EXAM: CT HEAD WITHOUT CONTRAST TECHNIQUE: Contiguous axial images were obtained from the base of the skull through the vertex without intravenous contrast. COMPARISON:  MR head 10/31/2020 BRAIN: BRAIN Cerebral ventricle sizes are concordant with the degree of cerebral volume loss. Right occipital lobe and right cerebellar encephalomalacia. No evidence of large-territorial acute infarction. No parenchymal hemorrhage. No mass  lesion. No extra-axial collection. No mass effect or midline shift. No hydrocephalus. Basilar cisterns are patent. Vascular: No hyperdense vessel. Atherosclerotic calcifications are present within the cavernous internal carotid and vertebral arteries. Skull: No acute fracture or focal lesion. Sinuses/Orbits: Paranasal sinuses and mastoid air cells are clear. The orbits are unremarkable. Other: None. IMPRESSION: No acute intracranial abnormality. Electronically Signed   By: Iven Finn M.D.   On: 03/21/2021 20:06   CT ABDOMEN PELVIS W CONTRAST  Result Date: 03/21/2021 CLINICAL DATA:  Abdominal pain. EXAM: CT ABDOMEN AND PELVIS WITH CONTRAST TECHNIQUE: Multidetector CT imaging of the abdomen and pelvis was performed using the standard protocol following bolus administration of intravenous contrast. CONTRAST:  123mL OMNIPAQUE IOHEXOL 300 MG/ML  SOLN COMPARISON:  May 24, 2019 FINDINGS: Lower chest: Partially visualized emphysematous changes. Hepatobiliary: No focal liver abnormality is seen. Cholelithiasis without CT evidence of cholecystitis. Pancreas: Atrophic appearance of the pancreas. Two coarse calcifications in the head of the pancreas, near the ampulla. No significant pancreatic duct dilation. Spleen: Normal in size without focal abnormality. Adrenals/Urinary Tract: Adrenal glands are unremarkable. Kidneys are normal, without renal calculi, focal lesion, or hydronephrosis. Mild urinary bladder wall thickening. Layering punctate calcifications within the right hand side of the urinary bladder. Stomach/Bowel: Stomach is within normal limits. Appendix appears normal. No evidence of small bowel wall thickening, distention, or inflammatory changes. Scattered colonic diverticulosis. Long segment of circumferential mucosal thickening of the sigmoid colon may represent colitis or diverticulitis. Vascular/Lymphatic: No significant vascular findings are present. No enlarged abdominal or pelvic lymph nodes.  Reproductive: Prostate is unremarkable. Other: No abdominal wall hernia or abnormality. No abdominopelvic ascites. Musculoskeletal: Spondylosis of the lumbosacral spine. IMPRESSION: 1. Long segment of circumferential mucosal thickening of the sigmoid colon, which may represent colitis or diverticulitis. 2. Cholelithiasis without CT evidence of cholecystitis. 3. Two coarse calcifications in the head of the pancreas, near the ampulla. No significant pancreatic duct dilation. These findings may represent sequela of chronic pancreatitis. 4. Layering punctate calcifications within the right hand side of the urinary bladder. Query recent ureteral calculi passage. 5. Partially visualized emphysematous changes in the lung bases. Emphysema (ICD10-J43.9). Electronically Signed   By: Fidela Salisbury M.D.   On: 03/21/2021 20:06   DG Chest Port 1 View  Result Date: 03/21/2021 CLINICAL DATA:  AFib.  Dizziness. EXAM: PORTABLE CHEST 1  VIEW COMPARISON:  November 07, 2020 FINDINGS: Calcific atherosclerotic disease and tortuosity of the aorta. Cardiomediastinal silhouette is normal. Mediastinal contours appear intact. There is no evidence of focal airspace consolidation, pleural effusion or pneumothorax. Chronic interstitial lung changes. Osseous structures are without acute abnormality. Lower cervical spine fusion. Soft tissues are grossly normal. IMPRESSION: 1. No active disease. 2. Chronic interstitial lung changes. Electronically Signed   By: Fidela Salisbury M.D.   On: 03/21/2021 19:22    Cardiac Studies   Transesophageal echocardiogram 11/06/2020-EF normal moderate mitral regurgitation  Patient Profile     70 y.o. male here with atrial fibrillation rapid ventricular response with troponin elevation compatible with demand ischemia  Assessment & Plan    Troponin elevation - Likely demand ischemia in the setting of atrial fibrillation with rapid ventricular response.  Overall trended down.  Has active colitis on  CT scan which likely triggered this atrial fibrillation.  Certainly could have underlying coronary artery disease.  He is asymptomatic with this atrial fibrillation.  No signs of heart failure. - Continue with rivaroxaban -At this point, we will hold off on further invasive or noninvasive cardiac evaluation.  DNR status.  Coronary atherosclerosis - Seen on prior CT scan in July.  Agree with Crestor 5 mg tablet.  Atrial fibrillation with rapid ventricular response - He has converted to normal sinus rhythm.  Diltiazem drip was stopped.  He is on metoprolol tartrate 25 mg twice a day.  I will increase this to 50 mg twice a day.  Xarelto.    Possible colitis - On Zosyn  We will go ahead and sign off.  Please let us know if we can be of further assistance.  For questions or updates, please contact [place] Please consult [email or web address] for contact info under        Signed, Candee Furbish, MD  03/23/2021, 9:12 AM

## 2021-03-23 NOTE — Evaluation (Signed)
Physical Therapy Evaluation Patient Details Name: Austin Andrade MRN: [id number] DOB: [birth year] Today's Date: 03/23/2021  History of Present Illness  Pt is a 70 y.o. male admitted on 03/21/21 with atrial fibrilation with RVR and diarrhea. CXR negative. CT showed possbile colitis or diverticulitis as well as cholelithiasis. PMH includes: ischemic stroke (residual hemianopsia and balance deficits), L tibial fx s/p external fixation 10/30/20, chronic neck pain, paroxysmal a-fib, HLD, HTN, mitral regugitation, CHF, aneurysm.  Clinical Impression  Pt presents with decreased functional mobility, pain, impaired balance, and decreased strength secondary to above. PTA, pt was modified independent with ADLs and ambulated in the home with a rollator. Currently, pt requiring assist for mobility and only able to tolerate short bouts of ambulation. Pt currently does not have anyone to assist him and does not feel he is ready to return home. Recommending SNF prior to discharge home to maximize independence and safety. Will continue to follow acutely to address short-term PT goals.     Orthostatic BPs Sitting 138/89  Standing 133/117  Standing after 2 min 142/76  Post-ambulation 146/87        Recommendations for follow up therapy are one component of a multi-disciplinary discharge planning process, led by the attending physician.  Recommendations may be updated based on patient status, additional functional criteria and insurance authorization.  Follow Up Recommendations Skilled nursing-short term rehab (<3 hours/day)    Assistance Recommended at Discharge Intermittent Supervision/Assistance  Functional Status Assessment Patient has had a recent decline in their functional status and demonstrates the ability to make significant improvements in function in a reasonable and predictable amount of time.  Equipment Recommendations  Rolling walker (2 wheels) (TBD)    Recommendations for Other Services        Precautions / Restrictions Precautions Precautions: Fall Required Braces or Orthoses: Other Brace Restrictions Weight Bearing Restrictions: Yes LLE Weight Bearing: Partial weight bearing (Per pt report) LLE Partial Weight Bearing Percentage or Pounds: 50%      Mobility  Bed Mobility Overal bed mobility: Modified Independent                  Transfers Overall transfer level: Needs assistance   Transfers: Sit to/from Stand Sit to Stand: Min assist           General transfer comment: Stood from EOB 2 times with increased time and heavy use of momentum and UE, min assist for stability. Cues for hand placement with RW    Ambulation/Gait Ambulation/Gait assistance: Min guard Gait Distance (Feet): 80 Feet Assistive device: Rolling walker (2 wheels) Gait Pattern/deviations: Decreased stride length;Decreased weight shift to left;Knee flexed in stance - left;Step-to pattern Gait velocity: decreased     General Gait Details: Heavy reliance on RW to offload left; min guard for safety  Stairs            Wheelchair Mobility    Modified Rankin (Stroke Patients Only)       Balance Overall balance assessment: Needs assistance Sitting-balance support: No upper extremity supported;Feet supported Sitting balance-Leahy Scale: Good Sitting balance - Comments: able to sit and scoot to EOB with no physical assist   Standing balance support: Single extremity supported;Reliant on assistive device for balance;During functional activity Standing balance-Leahy Scale: Fair Standing balance comment: able to static stand with one UE support; reliant on RW during ambulation                             Pertinent  Vitals/Pain Pain Assessment: Faces Faces Pain Scale: Hurts even more Pain Location: neck Pain Descriptors / Indicators: Aching;Heaviness;Grimacing Pain Intervention(s): Limited activity within patient's tolerance;Monitored during session    Home  Living Family/patient expects to be discharged to:: Private residence Living Arrangements: Non-relatives/Friends     Home Access: Stairs to enter Entrance Stairs-Rails: None Entrance Stairs-Number of Steps: 2   Home Layout: One level Home Equipment: Rollator (4 wheels);Tub bench Additional Comments: Pt has a roommate but he is currently in the hospital. Does not have other people he can call if he needs help    Prior Function Prior Level of Function : Independent/Modified Independent             Mobility Comments: Pt ambulated with rollator in the home. Does not ambulate in the community. ADLs Comments: Pt states he only bathes about once a week because it takes a long time and he doesn't feel safe doing it.     Hand Dominance        Extremity/Trunk Assessment   Upper Extremity Assessment Upper Extremity Assessment: Overall WFL for tasks assessed    Lower Extremity Assessment Lower Extremity Assessment: Generalized weakness;LLE deficits/detail LLE: Unable to fully assess due to immobilization       Communication   Communication: No difficulties  Cognition Arousal/Alertness: Awake/alert Behavior During Therapy: Flat affect Overall Cognitive Status: Within Functional Limits for tasks assessed                                          General Comments General comments (skin integrity, edema, etc.): Pt complained of dizziness when sitting up and standing. Negative for orthostatics    Exercises     Assessment/Plan    PT Assessment Patient needs continued PT services  PT Problem List Decreased strength;Decreased mobility;Decreased activity tolerance;Decreased balance;Cardiopulmonary status limiting activity;Pain       PT Treatment Interventions DME instruction;Gait training;Stair training;Therapeutic activities;Patient/family education;Balance training;Therapeutic exercise    PT Goals (Current goals can be found in the Care Plan section)  Acute  Rehab PT Goals Patient Stated Goal: to get more rehab before returning home PT Goal Formulation: With patient Time For Goal Achievement: 04/06/21 Potential to Achieve Goals: Good    Frequency Min 3X/week   Barriers to discharge Decreased caregiver support      Co-evaluation               AM-PAC PT "6 Clicks" Mobility  Outcome Measure Help needed turning from your back to your side while in a flat bed without using bedrails?: None Help needed moving from lying on your back to sitting on the side of a flat bed without using bedrails?: A Little Help needed moving to and from a bed to a chair (including a wheelchair)?: A Little Help needed standing up from a chair using your arms (e.g., wheelchair or bedside chair)?: A Little Help needed to walk in hospital room?: A Little Help needed climbing 3-5 steps with a railing? : A Lot 6 Click Score: 18    End of Session Equipment Utilized During Treatment: Gait belt Activity Tolerance: Patient tolerated treatment well Patient left: in bed;with bed alarm set;with call bell/phone within reach Nurse Communication: Mobility status (IV came out) PT Visit Diagnosis: Unsteadiness on feet (R26.81);Other abnormalities of gait and mobility (R26.89);Muscle weakness (generalized) (M62.81);History of falling (Z91.81)    Time: 1400-1435 PT Time Calculation (min) (ACUTE ONLY):  35 min   Charges:   PT Evaluation $PT Eval Moderate Complexity: 1 [address], SPT  Brandon Melnick 03/23/2021, 3:27 PM

## 2021-03-24 DIAGNOSIS — G894 Chronic pain syndrome: Secondary | ICD-10-CM

## 2021-03-24 DIAGNOSIS — R5381 Other malaise: Secondary | ICD-10-CM

## 2021-03-24 DIAGNOSIS — K5792 Diverticulitis of intestine, part unspecified, without perforation or abscess without bleeding: Secondary | ICD-10-CM

## 2021-03-24 DIAGNOSIS — I480 Paroxysmal atrial fibrillation: Secondary | ICD-10-CM

## 2021-03-24 DIAGNOSIS — F320 Major depressive disorder, single episode, mild: Secondary | ICD-10-CM

## 2021-03-24 LAB — CBC
HCT: 37.1 % — ABNORMAL LOW (ref 39.0–52.0)
Hemoglobin: 12.4 g/dL — ABNORMAL LOW (ref 13.0–17.0)
MCH: 30.8 pg (ref 26.0–34.0)
MCHC: 33.4 g/dL (ref 30.0–36.0)
MCV: 92.1 fL (ref 80.0–100.0)
Platelets: 228 10*3/uL (ref 150–400)
RBC: 4.03 MIL/uL — ABNORMAL LOW (ref 4.22–5.81)
RDW: 14.7 % (ref 11.5–15.5)
WBC: 10.6 10*3/uL — ABNORMAL HIGH (ref 4.0–10.5)
nRBC: 0 % (ref 0.0–0.2)

## 2021-03-24 LAB — BASIC METABOLIC PANEL
Anion gap: 9 (ref 5–15)
BUN: 25 mg/dL — ABNORMAL HIGH (ref 8–23)
CO2: 22 mmol/L (ref 22–32)
Calcium: 8.8 mg/dL — ABNORMAL LOW (ref 8.9–10.3)
Chloride: 105 mmol/L (ref 98–111)
Creatinine, Ser: 0.93 mg/dL (ref 0.61–1.24)
GFR, Estimated: >60 mL/min (ref >60)
Glucose, Bld: 147 mg/dL — ABNORMAL HIGH (ref 70–99)
Potassium: 4.3 mmol/L (ref 3.5–5.1)
Sodium: 136 mmol/L (ref 135–145)

## 2021-03-24 LAB — METHYLMALONIC ACID, SERUM: Methylmalonic Acid, Quantitative: 86 nmol/L (ref 0–378)

## 2021-03-24 MED ORDER — SODIUM CHLORIDE 0.9 % IV SOLN
3.0000 g | Freq: Four times a day (QID) | INTRAVENOUS | Status: DC
  Administered 2021-03-24 – 2021-03-27 (×12): 3 g via INTRAVENOUS
  Filled 2021-03-24 (×14): qty 8

## 2021-03-24 MED ORDER — GABAPENTIN 600 MG PO TABS
600.0000 mg | ORAL_TABLET | Freq: Three times a day (TID) | ORAL | Status: DC
  Administered 2021-03-24 – 2021-03-29 (×15): 600 mg via ORAL
  Filled 2021-03-24 (×15): qty 1

## 2021-03-24 MED ORDER — BACLOFEN 5 MG HALF TABLET
5.0000 mg | ORAL_TABLET | Freq: Three times a day (TID) | ORAL | Status: DC | PRN
  Administered 2021-03-28 – 2021-03-29 (×2): 5 mg via ORAL
  Filled 2021-03-24 (×3): qty 1

## 2021-03-24 NOTE — Progress Notes (Signed)
PT Cancellation Note  Patient Details Name: Austin Andrade MRN: [id number] DOB: [birth year]   Cancelled Treatment:    Reason Eval/Treat Not Completed: Fatigue/lethargy limiting ability to participate, pt declining despite max encouragement. Will follow-up for PT treatment as schedule permits.  Mabeline Caras, PT, DPT Acute Rehabilitation Services  Pager [phone] Office [place] 03/24/2021, 2:29 PM

## 2021-03-24 NOTE — Care Management Important Message (Signed)
Important Message  Patient Details  Name: Austin Andrade MRN: [id number] Date of Birth: May 14, 1950   Medicare Important Message Given:  Yes     Tayshun Gappa 03/24/2021, 1:56 PM

## 2021-03-24 NOTE — Evaluation (Signed)
Occupational Therapy Evaluation Patient Details Name: Austin Andrade MRN: [id number] DOB: 03-19-1951 Today's Date: 03/24/2021   History of Present Illness Pt is a 70 y.o. male admitted on 03/21/21 with atrial fibrilation with RVR and diarrhea. CXR negative. CT showed possbile colitis or diverticulitis as well as cholelithiasis. PMH includes: ischemic stroke (residual hemianopsia and balance deficits), L tibial fx s/p external fixation 10/30/20, chronic neck pain, paroxysmal a-fib, HLD, HTN, mitral regugitation, CHF, aneurysm.   Clinical Impression   Pt admitted for concerns listed above. PTA pt reported that he was independent with all ADL's, however he stated that many things took him a long time due to weakness and low energy.  At this time, session was limited due to pt deferring any OOB or EOB activities. Pt completed bed mobility with mod I and demonstrated UE ROM and strength WFL. Pt reporting he feels a lot weaker than normal. At this time recommending SNF to maximize pt's independence, strength, and safety. OT will follow acutely to address concerns listed below.      Recommendations for follow up therapy are one component of a multi-disciplinary discharge planning process, led by the attending physician.  Recommendations may be updated based on patient status, additional functional criteria and insurance authorization.   Follow Up Recommendations  Skilled nursing-short term rehab (<3 hours/day)    Assistance Recommended at Discharge Frequent or constant Supervision/Assistance  Functional Status Assessment  Patient has had a recent decline in their functional status and demonstrates the ability to make significant improvements in function in a reasonable and predictable amount of time.  Equipment Recommendations  Other (comment) (TBD)    Recommendations for Other Services       Precautions / Restrictions Precautions Precautions: Fall Restrictions Weight Bearing Restrictions:  Yes LLE Weight Bearing: Partial weight bearing LLE Partial Weight Bearing Percentage or Pounds: 50 Other Position/Activity Restrictions: Per pt report, unsure last time he has followed up with Ortho.      Mobility Bed Mobility Overal bed mobility: Modified Independent             General bed mobility comments: Pt completed rolling in bed, to change pads under him and scooting himself up to get higher in bed. OT provided verbal cuing for foot and hand placement to assist with scoot.    Transfers                   General transfer comment: Pt refused to get OOB this session.      Balance       Sitting balance - Comments: Pt refused to sit EOB                                   ADL either performed or assessed with clinical judgement   ADL Overall ADL's : Needs assistance/impaired Eating/Feeding: Set up;Sitting   Grooming: Set up;Sitting   Upper Body Bathing: Minimal assistance;Sitting   Lower Body Bathing: Moderate assistance;Bed level   Upper Body Dressing : Minimal assistance;Sitting   Lower Body Dressing: Moderate assistance;Bed level       Toileting- Clothing Manipulation and Hygiene: Moderate assistance;Bed level         General ADL Comments: Pt limited to bed level activities this session, due to refusal of getting out of bed.     Vision Baseline Vision/History: 1 Wears glasses Ability to See in Adequate Light: 1 Impaired Patient Visual Report: No change from baseline  Vision Assessment?: No apparent visual deficits Additional Comments: Pt reports that he has difficulty seeing things up close, unable to see his phone laying next to him in bed without cuing     Perception     Praxis      Pertinent Vitals/Pain Pain Assessment: Faces Faces Pain Scale: Hurts a little bit Pain Location: neck Pain Descriptors / Indicators: Aching;Discomfort;Grimacing Pain Intervention(s): Monitored during session;Repositioned     Hand  Dominance Right   Extremity/Trunk Assessment Upper Extremity Assessment Upper Extremity Assessment: Overall WFL for tasks assessed   Lower Extremity Assessment Lower Extremity Assessment: Defer to PT evaluation       Communication Communication Communication: No difficulties   Cognition Arousal/Alertness: Awake/alert Behavior During Therapy: Flat affect Overall Cognitive Status: No family/caregiver present to determine baseline cognitive functioning                                 General Comments: Pt with some recall deficits     General Comments  VSS on RA, Highest HR noted 78, O2 >95%    Exercises     Shoulder Instructions      Home Living Family/patient expects to be discharged to:: Private residence Living Arrangements: Non-relatives/Friends Available Help at Discharge: Friend(s);Available PRN/intermittently Type of Home: Apartment Home Access: Level entry     Home Layout: One level     Bathroom Shower/Tub: Teacher, early years/pre: Standard Bathroom Accessibility: Yes   Home Equipment: Rollator (4 wheels);Tub bench   Additional Comments: Pt has a roommate but he is currently in the hospital. Does not have other people he can call if he needs help      Prior Functioning/Environment Prior Level of Function : Independent/Modified Independent             Mobility Comments: Pt ambulated with rollator in the home. Does not ambulate in the community. ADLs Comments: Pt states he only bathes about once a week because it takes a long time and he doesn't feel safe doing it.        OT Problem List: Decreased strength;Decreased activity tolerance;Impaired balance (sitting and/or standing);Decreased safety awareness;Decreased knowledge of use of DME or AE;Cardiopulmonary status limiting activity      OT Treatment/Interventions: Self-care/ADL training;Therapeutic exercise;Energy conservation;DME and/or AE instruction;Therapeutic  activities;Patient/family education;Balance training    OT Goals(Current goals can be found in the care plan section) Acute Rehab OT Goals Patient Stated Goal: None Stated OT Goal Formulation: With patient Time For Goal Achievement: 04/07/21 Potential to Achieve Goals: Good ADL Goals Pt Will Perform Grooming: with modified independence;standing Pt Will Perform Lower Body Bathing: with min assist;with adaptive equipment;sitting/lateral leans;sit to/from stand Pt Will Perform Lower Body Dressing: with min assist;with adaptive equipment;sitting/lateral leans;sit to/from stand Pt Will Transfer to Toilet: with min assist;ambulating Pt Will Perform Toileting - Clothing Manipulation and hygiene: with min assist;with adaptive equipment;sitting/lateral leans;sit to/from stand Additional ADL Goal #1: Pt will complete dynamic standing task for 5 mins to improve activity tolerance. Additional ADL Goal #2: Pt will verbalize 3 energy conservation techniques that he can utilize at home.  OT Frequency: Min 2X/week   Barriers to D/C:    Pt lives with a friend, who is in the hospital at this time and unable to assist       Co-evaluation              AM-PAC OT "6 Clicks" Daily Activity  Outcome Measure Help from another person eating meals?: A Little Help from another person taking care of personal grooming?: A Little Help from another person toileting, which includes using toliet, bedpan, or urinal?: A Lot Help from another person bathing (including washing, rinsing, drying)?: A Lot Help from another person to put on and taking off regular upper body clothing?: A Little Help from another person to put on and taking off regular lower body clothing?: A Lot 6 Click Score: 15   End of Session Nurse Communication: Mobility status  Activity Tolerance: Other (comment) (Pt refusing OOB/EOB mobility this session) Patient left: in bed;with call bell/phone within reach;with bed alarm set  OT Visit  Diagnosis: Unsteadiness on feet (R26.81);Muscle weakness (generalized) (M62.81);Other abnormalities of gait and mobility (R26.89)                Time: 7322-0254 OT Time Calculation (min): 23 min Charges:  OT General Charges $OT Visit: 1 Visit OT Evaluation $OT Eval Moderate Complexity: 1 Mod OT Treatments $Therapeutic Activity: 8-22 mins  Kweli Grassel H., OTR/L Acute Rehabilitation  Amedee Cerrone Elane Yolanda Bonine 03/24/2021, 12:17 PM

## 2021-03-24 NOTE — Progress Notes (Signed)
PROGRESS NOTE  Austin Andrade XBM:[id number] DOB: Mar 03, 1951   PCP: Sandi Mariscal, MD  Patient is from: Home  DOA: 03/21/2021 LOS: 3  Chief complaints:  Chief Complaint  Patient presents with   Tachycardia     Brief Narrative / Interim history: 70 year old F with PMH of PAF, HTN, HLD, moderate MVR and BPH presenting with diarrhea and admitted for A. fib with RVR and possible diverticulitis/colitis.  CT scan suggestive for diverticulitis versus colitis.  Patient was started on Cardizem drip and IV Zosyn.    Patient's RVR resolved and he was transitioned to p.o. meds.  He was on IV Zosyn for sepsis due to possible diverticulitis.  However, patient has not had further bowel movements.  Therapy recommended SNF   Subjective: Seen and examined earlier this morning.  No major events overnight of this morning.  He states feeling "depressed" attributing to his current situation.  He is concerned about his apartment rent.  He states he has a check at home but  has no one to bring it to him since his roommate was hospitalized.  He denies suicidal or homicidal ideation.  He denies history of depression although he seems to be taking Cymbalta at home.  He denies audiovisual hallucination.  Objective: Vitals:   03/24/21 0403 03/24/21 0613 03/24/21 0800 03/24/21 1301  BP: 123/75  116/72 125/72  Pulse: 64  69 70  Resp: 18  14 11   Temp: 98.2 F (36.8 C)  97.8 F (36.6 C) 97.6 F (36.4 C)  TempSrc: Oral  Oral Oral  SpO2: 92%  91% 97%  Weight:  61 kg    Height:        Intake/Output Summary (Last 24 hours) at 03/24/2021 1601 Last data filed at 03/24/2021 0403 Gross per 24 hour  Intake 285.63 ml  Output 450 ml  Net -164.37 ml   Filed Weights   03/21/21 1604 03/23/21 0549 03/24/21 0613  Weight: 59 kg 62.7 kg 61 kg    Examination:  GENERAL: Frail looking elderly male.  No apparent distress. HEENT: MMM.  Vision and hearing grossly intact.  NECK: Supple.  No apparent JVD.  RESP:  No IWOB.   Fair aeration bilaterally. CVS:  RRR. Heart sounds normal.  ABD/GI/GU: BS+. Abd soft, NTND.  MSK/EXT:  Moves extremities. No apparent deformity. No edema.  SKIN: no apparent skin lesion or wound NEURO: Awake, alert and oriented appropriately.  No apparent focal neuro deficit. PSYCH: Calm.  Flat affect.  Procedures:  None  Microbiology summarized: [id number]-36 and influenza PCR nonreactive. Blood cultures NGTD.  Assessment & Plan: Paroxysmal A. fib with RVR: RVR resolved.  CHA2DS2-VASc score 5. -On metoprolol and Xarelto per cardiology.   Sepsis due to acute diverticulitis?  Patient presents with diarrhea for 3 days.  CT shows bowel wall thickening of the sigmoid colon concerning for diverticulitis versus colitis.  Blood cultures NGTD.  Sepsis physiology resolved. -De-escalate antibiotic to IV Unasyn, and discharged on p.o. Augmentin for a total of 10 days -Needs colonoscopy once he recovers from diverticulitis   Dehydration: Likely due to the above.  Resolved.   Essential Hypertension: Normotensive -Continue Toprol   Hyperlipidemia: documented h/o such.  -Continue Crestor   BPH without LUTS -Continue on Flomax    Chronic neck pain/depression-feels depressed.  Flat affect.  He denies HI/SI/psychotic symptoms. -resume home regimen-narcotic database reviewed. -Resume home gabapentin at reduced dose -Resume home baclofen.  History of systolic CHF with recovered EF: Appears euvolemic. -Most recent TTE basically normal except for  mildly elevated RVSP.   History of right PCA and left cerebellar CVA in 2013 Generalized weakness: Likely due to the above. Recent left lower extremity fracture -Therapy recommends SNF. -TOC consulted for assistance    Body mass index is 20.45 kg/m.         DVT prophylaxis:  SCDs Start: 03/21/21 2103 rivaroxaban (XARELTO) tablet 20 mg  Code Status: DNR/DNI Family Communication: Patient and/or RN. Available if any question.  Level of care:  Telemetry Medical Status is: Inpatient  Remains inpatient appropriate because: IV antibiotics Ancef disposition/SNF   Final disposition: Likely SNF.    Consultants:  Cardiology   Sch Meds:  Scheduled Meds:  DULoxetine  30 mg Oral Daily   gabapentin  600 mg Oral TID   metoprolol tartrate  50 mg Oral BID   rivaroxaban  20 mg Oral Q supper   rosuvastatin  5 mg Oral Q supper   tamsulosin  0.4 mg Oral QPM   Continuous Infusions:  ampicillin-sulbactam (UNASYN) IV 3 g (03/24/21 1516)   PRN Meds:.acetaminophen **OR** acetaminophen, Baclofen, fentaNYL (SUBLIMAZE) injection, naLOXone (NARCAN)  injection, oxyCODONE, traMADol  Antimicrobials: Anti-infectives (From admission, onward)    Start     Dose/Rate Route Frequency Ordered Stop   03/24/21 0845  Ampicillin-Sulbactam (UNASYN) 3 g in sodium chloride 0.9 % 100 mL IVPB        3 g 200 mL/hr over 30 Minutes Intravenous Every 6 hours 03/24/21 0752 04/10/2021 0844   03/21/21 2230  piperacillin-tazobactam (ZOSYN) IVPB 3.375 g  Status:  Discontinued        3.375 g 12.5 mL/hr over 240 Minutes Intravenous Every 8 hours 03/21/21 2226 03/24/21 0752   03/21/21 2045  piperacillin-tazobactam (ZOSYN) IVPB 3.375 g        3.375 g 100 mL/hr over 30 Minutes Intravenous  Once 03/21/21 2033 03/21/21 2143        I have personally reviewed the following labs and images: CBC: Recent Labs  Lab 03/21/21 1649 03/21/21 1722 03/22/21 0418 03/23/21 0119 03/24/21 0425  WBC 12.4*  --  13.5* 12.2* 10.6*  NEUTROABS  --   --  10.6*  --   --   HGB 14.0 15.0 14.1 12.2* 12.4*  HCT 43.5 44.0 43.7 37.4* 37.1*  MCV 93.5  --  94.8 93.3 92.1  PLT 329  --  276 253 228   BMP &GFR Recent Labs  Lab 03/21/21 1649 03/21/21 1722 03/22/21 0418 03/23/21 0119 03/24/21 0425  NA 139 141 137 135 136  K 4.1 4.3 4.0 4.3 4.3  CL 108 108 107 106 105  CO2 22  --  20* 21* 22  GLUCOSE 112* 105* 113* 119* 147*  BUN 25* 26* 18 17 25*  CREATININE 0.88 0.80 0.78 0.88  0.93  CALCIUM 9.1  --  9.0 9.0 8.8*  MG 2.0  --  2.0  --   --    Estimated Creatinine Clearance: 62.9 mL/min (by C-G formula based on SCr of 0.93 mg/dL). Liver & Pancreas: Recent Labs  Lab 03/21/21 1649 03/22/21 0418  AST 24 19  ALT 9 11  ALKPHOS 87 102  BILITOT 0.5 0.8  PROT 6.9 7.2  ALBUMIN 3.3* 3.4*   Recent Labs  Lab 03/21/21 1649  LIPASE 24   Recent Labs  Lab 03/21/21 1655  AMMONIA 15   Diabetic: No results for input(s): HGBA1C in the last 72 hours. No results for input(s): GLUCAP in the last 168 hours. Cardiac Enzymes: No results for input(s): CKTOTAL, CKMB,  CKMBINDEX, TROPONINI in the last 168 hours. No results for input(s): PROBNP in the last 8760 hours. Coagulation Profile: No results for input(s): INR, PROTIME in the last 168 hours. Thyroid Function Tests: Recent Labs    03/21/21 1652 03/23/21 0119  TSH 0.318*  --   FREET4  --  0.91   Lipid Profile: No results for input(s): CHOL, HDL, LDLCALC, TRIG, CHOLHDL, LDLDIRECT in the last 72 hours. Anemia Panel: No results for input(s): VITAMINB12, FOLATE, FERRITIN, TIBC, IRON, RETICCTPCT in the last 72 hours. Urine analysis:    Component Value Date/Time   COLORURINE AMBER (A) 03/21/2021 2010   APPEARANCEUR CLOUDY (A) 03/21/2021 2010   LABSPEC 1.015 03/21/2021 2010   PHURINE 5.5 03/21/2021 2010   GLUCOSEU NEGATIVE 03/21/2021 2010   HGBUR NEGATIVE 03/21/2021 2010   [place] NEGATIVE 03/21/2021 2010   [place] NEGATIVE 03/21/2021 2010   PROTEINUR NEGATIVE 03/21/2021 2010   UROBILINOGEN 1.0 09/01/2013 1753   NITRITE POSITIVE (A) 03/21/2021 2010   LEUKOCYTESUR NEGATIVE 03/21/2021 2010   Sepsis Labs: Invalid input(s): PROCALCITONIN, [place]  Microbiology: Recent Results (from the past 240 hour(s))  Culture, blood (routine x 2)     Status: None (Preliminary result)   Collection Time: 03/21/21  8:50 PM   Specimen: BLOOD LEFT ARM  Result Value Ref Range Status   Specimen Description BLOOD LEFT  ARM  Final   Special Requests   Final    BOTTLES DRAWN AEROBIC AND ANAEROBIC Blood Culture results may not be optimal due to an inadequate volume of blood received in culture bottles   Culture   Final    NO GROWTH 3 DAYS Performed at [facility], [place] [address], [place], [place] [address]    Report Status PENDING  Incomplete  Culture, blood (routine x 2)     Status: None (Preliminary result)   Collection Time: 03/21/21  8:53 PM   Specimen: BLOOD RIGHT ARM  Result Value Ref Range Status   Specimen Description BLOOD RIGHT ARM  Final   Special Requests   Final    BOTTLES DRAWN AEROBIC AND ANAEROBIC Blood Culture results may not be optimal due to an inadequate volume of blood received in culture bottles   Culture   Final    NO GROWTH 3 DAYS Performed at [facility], [place] [address], [place], [place] [address]    Report Status PENDING  Incomplete  Resp Panel by RT-PCR (Flu A&B, Covid) Nasopharyngeal Swab     Status: None   Collection Time: 03/21/21  8:59 PM   Specimen: Nasopharyngeal Swab; Nasopharyngeal(NP) swabs in vial transport medium  Result Value Ref Range Status   SARS Coronavirus 2 by RT PCR NEGATIVE NEGATIVE Final    Comment: (NOTE) SARS-CoV-2 target nucleic acids are NOT DETECTED.  The SARS-CoV-2 RNA is generally detectable in upper respiratory specimens during the acute phase of infection. The lowest concentration of SARS-CoV-2 viral copies this assay can detect is 138 copies/mL. A negative result does not preclude SARS-Cov-2 infection and should not be used as the sole basis for treatment or other patient management decisions. A negative result may occur with  improper specimen collection/handling, submission of specimen other than nasopharyngeal swab, presence of viral mutation(s) within the areas targeted by this assay, and inadequate number of viral copies(<138 copies/mL). A negative result must be combined with clinical observations, patient history, and  epidemiological information. The expected result is Negative.  Fact Sheet for Patients:  EntrepreneurPulse.com.au  Fact Sheet for Healthcare Providers:  IncredibleEmployment.be  This test is no t yet approved or cleared by the [place] and  has been authorized for detection and/or diagnosis of SARS-CoV-2 by FDA under an Emergency Use Authorization (EUA). This EUA will remain  in effect (meaning this test can be used) for the duration of the COVID-19 declaration under Section 564(b)(1) of the Act, 21 U.S.C.section 360bbb-3(b)(1), unless the authorization is terminated  or revoked sooner.       Influenza A by PCR NEGATIVE NEGATIVE Final   Influenza B by PCR NEGATIVE NEGATIVE Final    Comment: (NOTE) The Xpert Xpress SARS-CoV-2/FLU/RSV plus assay is intended as an aid in the diagnosis of influenza from Nasopharyngeal swab specimens and should not be used as a sole basis for treatment. Nasal washings and aspirates are unacceptable for Xpert Xpress SARS-CoV-2/FLU/RSV testing.  Fact Sheet for Patients: EntrepreneurPulse.com.au  Fact Sheet for Healthcare Providers: IncredibleEmployment.be  This test is not yet approved or cleared by the [place] FDA and has been authorized for detection and/or diagnosis of SARS-CoV-2 by FDA under an Emergency Use Authorization (EUA). This EUA will remain in effect (meaning this test can be used) for the duration of the COVID-19 declaration under Section 564(b)(1) of the Act, 21 U.S.C. section 360bbb-3(b)(1), unless the authorization is terminated or revoked.  Performed at [facility], [place] [address], [place], [place] [address]     Radiology Studies: No results found.    Michell Giuliano T. [place]  If 7PM-7AM, please contact night-coverage [email or web address] 03/24/2021, 4:01 PM

## 2021-03-25 LAB — RENAL FUNCTION PANEL
Albumin: 2.8 g/dL — ABNORMAL LOW (ref 3.5–5.0)
Anion gap: 7 (ref 5–15)
BUN: 22 mg/dL (ref 8–23)
CO2: 23 mmol/L (ref 22–32)
Calcium: 8.8 mg/dL — ABNORMAL LOW (ref 8.9–10.3)
Chloride: 106 mmol/L (ref 98–111)
Creatinine, Ser: 0.92 mg/dL (ref 0.61–1.24)
GFR, Estimated: >60 mL/min (ref >60)
Glucose, Bld: 176 mg/dL — ABNORMAL HIGH (ref 70–99)
Phosphorus: 3.1 mg/dL (ref 2.5–4.6)
Potassium: 4.1 mmol/L (ref 3.5–5.1)
Sodium: 136 mmol/L (ref 135–145)

## 2021-03-25 LAB — MAGNESIUM: Magnesium: 2.3 mg/dL (ref 1.7–2.4)

## 2021-03-25 LAB — CBC
HCT: 37.9 % — ABNORMAL LOW (ref 39.0–52.0)
Hemoglobin: 12.4 g/dL — ABNORMAL LOW (ref 13.0–17.0)
MCH: 30.7 pg (ref 26.0–34.0)
MCHC: 32.7 g/dL (ref 30.0–36.0)
MCV: 93.8 fL (ref 80.0–100.0)
Platelets: 208 10*3/uL (ref 150–400)
RBC: 4.04 MIL/uL — ABNORMAL LOW (ref 4.22–5.81)
RDW: 15 % (ref 11.5–15.5)
WBC: 10.7 10*3/uL — ABNORMAL HIGH (ref 4.0–10.5)
nRBC: 0 % (ref 0.0–0.2)

## 2021-03-25 NOTE — Progress Notes (Signed)
Physical Therapy Treatment Patient Details Name: Austin Andrade MRN: [id number] DOB: [birth year] Today's Date: 03/25/2021   History of Present Illness Pt is a 70 y.o. male admitted on 03/21/21 with atrial fibrilation with RVR and diarrhea. CXR negative. CT showed possbile colitis or diverticulitis as well as cholelithiasis. PMH includes: ischemic stroke (residual hemianopsia and balance deficits), L tibial fx s/p external fixation 10/30/20, chronic neck pain, paroxysmal a-fib, HLD, HTN, mitral regugitation, CHF, aneurysm.    PT Comments    Pt is progressing well with mobility. Able to ambulate further in hall today with cues and min guard. Pt continues to be limited by decreased functional mobility and activity tolerance. Pt does not have any caregiver support at home and continues to request SNF due to safety concerns with mobility and ADLs. If pt were to discharge home, would need increased assistance during first few days and HHPT to maximize pt safety and fall risk reduction. Plan to practice stairs next visit.    Recommendations for follow up therapy are one component of a multi-disciplinary discharge planning process, led by the attending physician.  Recommendations may be updated based on patient status, additional functional criteria and insurance authorization.  Follow Up Recommendations  Skilled nursing-short term rehab (<3 hours/day) ((per pt request due to safety concerns))     Assistance Recommended at Discharge Intermittent Supervision/Assistance  Equipment Recommendations  Rolling walker (2 wheels)    Recommendations for Other Services       Precautions / Restrictions Precautions Precautions: Fall Required Braces or Orthoses: Other Brace (boot on LLE) Restrictions Weight Bearing Restrictions: Yes LLE Weight Bearing: Partial weight bearing (per pt report) LLE Partial Weight Bearing Percentage or Pounds: 50%     Mobility  Bed Mobility Overal bed mobility: Modified  Independent Bed Mobility: Supine to Sit           General bed mobility comments: increased time    Transfers Overall transfer level: Needs assistance Equipment used: Rolling walker (2 wheels) Transfers: Sit to/from Stand Sit to Stand: Min guard           General transfer comment: took increased time to elevate, heavy reliance on momentum and UE, cues for UE placement with RW, min guard for safety    Ambulation/Gait Ambulation/Gait assistance: Min guard Gait Distance (Feet): 290 Feet Assistive device: Rolling walker (2 wheels) Gait Pattern/deviations: Decreased stride length;Decreased weight shift to left;Knee flexed in stance - left;Step-to pattern Gait velocity: decreased     General Gait Details: heavy reliance on RW to offload LLE; cues for upright posture; min guard for safety. Pt chose to ambulate with RW over rollator - states the RW turns easier.1 standing rest break due to fatigue   Stairs             Wheelchair Mobility    Modified Rankin (Stroke Patients Only)       Balance Overall balance assessment: Needs assistance Sitting-balance support: No upper extremity supported;Feet supported Sitting balance-Leahy Scale: Good     Standing balance support: Single extremity supported;Reliant on assistive device for balance;During functional activity;No upper extremity supported Standing balance-Leahy Scale: Fair Standing balance comment: able to static stand without UE support; reliant on RW during ambulation             High level balance activites: Head turns High Level Balance Comments: Completed head turns and reaching with no LOB with support of RW            Cognition Arousal/Alertness: Awake/alert Behavior During Therapy: [facility]  for tasks assessed/performed Overall Cognitive Status: No family/caregiver present to determine baseline cognitive functioning Area of Impairment: Safety/judgement;Memory                     Memory:  Decreased short-term memory   Safety/Judgement: Decreased awareness of safety     General Comments: Reports he was feeling much better today. More conversational and telling jokes. Cues needed for safe RW use, could not recall if he had a RW at home.        Exercises      General Comments General comments (skin integrity, edema, etc.): Pt pulling on neck to try to stretch it. Educated him on a more gentle way to stretch it and help provide some relief. Discussed discharge reccomendations and pt continues to request SNF due to no caregiver support.      Pertinent Vitals/Pain Pain Assessment: 0-10 Pain Score: 3  Pain Location: neck Pain Descriptors / Indicators: Aching;Discomfort;Grimacing;Tightness Pain Intervention(s): Monitored during session;Repositioned;Other (comment) (showed pt a stretch)    Home Living                          Prior Function            PT Goals (current goals can now be found in the care plan section) Acute Rehab PT Goals Patient Stated Goal: to get more rehab before returning home PT Goal Formulation: With patient Potential to Achieve Goals: Good Progress towards PT goals: Progressing toward goals    Frequency    Min 3X/week      PT Plan Current plan remains appropriate    Co-evaluation              AM-PAC PT "6 Clicks" Mobility   Outcome Measure  Help needed turning from your back to your side while in a flat bed without using bedrails?: None Help needed moving from lying on your back to sitting on the side of a flat bed without using bedrails?: A Little Help needed moving to and from a bed to a chair (including a wheelchair)?: A Little Help needed standing up from a chair using your arms (e.g., wheelchair or bedside chair)?: A Little Help needed to walk in hospital room?: A Little Help needed climbing 3-5 steps with a railing? : A Lot 6 Click Score: 18    End of Session Equipment Utilized During Treatment: Gait  belt Activity Tolerance: Patient tolerated treatment well Patient left: in chair;with chair alarm set;with call bell/phone within reach Nurse Communication: Mobility status PT Visit Diagnosis: Unsteadiness on feet (R26.81);Other abnormalities of gait and mobility (R26.89);Muscle weakness (generalized) (M62.81);History of falling (Z91.81)     Time: 7253-6644 PT Time Calculation (min) (ACUTE ONLY): 32 min  Charges:  $Gait Training: 8-22 mins $Therapeutic Exercise: 8-22 mins                     Brandon Melnick, SPT    Brandon Melnick 03/25/2021, 3:15 PM

## 2021-03-25 NOTE — NC FL2 (Signed)
[place] LEVEL OF CARE SCREENING TOOL     IDENTIFICATION  Patient Name: Austin Andrade Birthdate: Jun 19, 1950 Sex: male Admission Date (Current Location): 03/21/2021  [facility] and [place] Number:  Herbalist and Address:  [facility] [place] [address] [place], [place] [address]      Provider Number: [id number]  Attending Physician Name and Address:  Mercy Riding, MD  Relative Name and Phone Number:  Sherlynn Carbon (friend) [phone]    Current Level of Care: Hospital Recommended Level of Care: [place] Prior Approval Number:    Date Approved/Denied:   PASRR Number: [id number]  Discharge Plan: SNF    Current Diagnoses: Patient Active Problem List   Diagnosis Date Noted   Atrial fibrillation with RVR ([place]) 03/21/2021   Elevated troponin 03/21/2021   Leukocytosis 03/21/2021   Diarrhea 03/21/2021   Generalized weakness 03/21/2021   Dehydration 03/21/2021   Congestive heart failure ([place]) 11/10/2020   Encephalopathy acute 10/30/2020   Tibia/fibula fracture, left, closed, initial encounter 10/30/2020   Subdural hematoma 10/30/2020   Rhabdomyolysis 10/30/2020   Sepsis ([place]) 05/23/2019   ARF (acute renal failure) ([place]) 05/23/2019   Weight loss 02/04/2014   Cerebral infarction ([place]) 05/16/2012   Cardiomyopathy ([place]) 02/03/2012   Preventative health care 01/26/2012   Artery occlusion, 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   CHF (congestive heart failure) ([place]) 01/19/2012   Acute ischemic multifocal posterior circulation stroke ([place]) 01/18/2012   Hyperlipidemia with target LDL less than 100 01/18/2012   Stenosis, cervical spine 01/18/2012   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   Hypertension 01/17/2012    Orientation RESPIRATION BLADDER Height & Weight     Time, Situation, Self, Place  Normal Continent Weight: 61.7 kg Height:  5\' 8"  (172.7 cm)  BEHAVIORAL SYMPTOMS/MOOD NEUROLOGICAL BOWEL NUTRITION STATUS       Continent Diet (see dc summary)  AMBULATORY STATUS COMMUNICATION OF NEEDS Skin   Extensive Assist Verbally Normal                       Personal Care Assistance Level of Assistance  Feeding, Bathing, Dressing Bathing Assistance: Limited assistance (needs more help bathing lower body) Feeding assistance: Independent Dressing Assistance: Limited assistance (needs more help with dressin lower body)     Functional Limitations Info  Sight, Hearing, Speech Sight Info: Adequate Hearing Info: Adequate Speech Info: Adequate    SPECIAL CARE FACTORS FREQUENCY  PT (By licensed PT), OT (By licensed OT)     PT Frequency: 5x.week OT Frequency: 5x/week            Contractures Contractures Info: Not present    Additional Factors Info  Code Status, Allergies Code Status Info: DNR Allergies Info: No Known Allergies           Current Medications (03/25/2021):  This is the current hospital active medication list Current Facility-Administered Medications  Medication Dose Route Frequency Provider Last Rate Last Admin   acetaminophen (TYLENOL) tablet 650 mg  650 mg Oral Q6H PRN Howerter, Justin B, DO       Or   acetaminophen (TYLENOL) suppository 650 mg  650 mg Rectal Q6H PRN Howerter, Justin B, DO       Ampicillin-Sulbactam (UNASYN) 3 g in sodium chloride 0.9 % 100 mL IVPB  3 g Intravenous Q6H Gonfa, Taye T, MD 200 mL/hr at 03/25/21 1444 3 g at 03/25/21 1444   baclofen (LIORESAL) tablet 5 mg  5 mg Oral Q8H PRN Mercy Riding, MD       DULoxetine (CYMBALTA) DR capsule 30 mg  30 mg Oral Daily Vann, Jessica U, DO   30 mg at 03/25/21 0810   fentaNYL (SUBLIMAZE) injection 25 mcg  25 mcg Intravenous Q2H PRN Howerter, Justin B, DO   25 mcg at 03/23/21 0559   gabapentin (NEURONTIN) tablet 600 mg  600 mg Oral TID Wendee Beavers T, MD   600 mg at 03/25/21 0810   metoprolol tartrate (LOPRESSOR) tablet 50 mg  50 mg Oral BID Jerline Pain, MD   50 mg at 03/25/21 0810   naloxone [facility])  injection 0.4 mg  0.4 mg Intravenous PRN Howerter, Justin B, DO       oxyCODONE (Oxy IR/ROXICODONE) immediate release tablet 15 mg  15 mg Oral Q12H PRN Eulogio Bear U, DO   15 mg at 03/23/21 2246   rivaroxaban (XARELTO) tablet 20 mg  20 mg Oral Q supper Vann, Jessica U, DO   20 mg at 03/24/21 1656   rosuvastatin (CRESTOR) tablet 5 mg  5 mg Oral Q supper Howerter, Justin B, DO   5 mg at 03/24/21 1655   tamsulosin (FLOMAX) capsule 0.4 mg  0.4 mg Oral QPM Vann, Jessica U, DO   0.4 mg at 03/24/21 1656   traMADol (ULTRAM) tablet 50 mg  50 mg Oral Q6H PRN Eulogio Bear U, DO   50 mg at 03/25/21 5945     Discharge Medications: Please see discharge summary for a list of discharge medications.  Relevant Imaging Results:  Relevant Lab Results:   Additional Information [phone] vacinated for covid with 2 boosters  Zenon Mayo, RN

## 2021-03-25 NOTE — Plan of Care (Signed)
  Problem: Education: Goal: Knowledge of General Education information will improve Description: Including pain rating scale, medication(s)/side effects and non-pharmacologic comfort measures Outcome: Progressing   Problem: Health Behavior/Discharge Planning: Goal: Ability to manage health-related needs will improve Outcome: Progressing   Problem: Clinical Measurements: Goal: Diagnostic test results will improve Outcome: Progressing   Problem: Activity: Goal: Risk for activity intolerance will decrease Outcome: Progressing   Problem: Pain Managment: Goal: General experience of comfort will improve Outcome: Progressing   

## 2021-03-25 NOTE — TOC Initial Note (Signed)
Transition of Care [facility]) - Initial/Assessment Note    Patient Details  Name: Kirsten Mckone MRN: [id number] Date of Birth: 03-28-1951  Transition of [facility]) CM/SW Contact:    Zenon Mayo, RN Phone Number: 03/25/2021, 4:01 PM  Clinical Narrative:                 NCM spoke with patient at bedside, he states he has been home a week from [place].  NCM asked if he was in agreement with the recs of physical therapy for SNF , he states yes. NCM asked if he had a preference. He states most likely [facility] again, and that we can fax his information out to see what bed offers he will get so he can choose from them.    Expected Discharge Plan: Skilled Nursing Facility Barriers to Discharge: Continued Medical Work up   Patient Goals and CMS Choice Patient states their goals for this hospitalization and ongoing recovery are:: SNF      Expected Discharge Plan and Services Expected Discharge Plan: [place]   Discharge Planning Services: CM Consult   Living arrangements for the past 2 months: Apartment                   DME Agency: NA       HH Arranged: NA          Prior Living Arrangements/Services Living arrangements for the past 2 months: Apartment Lives with:: Roommate Patient language and need for interpreter reviewed:: Yes Do you feel safe going back to the place where you live?: Yes      Need for Family Participation in Patient Care: No (Comment) Care giver support system in place?: No (comment)   Criminal Activity/Legal Involvement Pertinent to Current Situation/Hospitalization: No - Comment as needed  Activities of Daily Living Home Assistive Devices/Equipment: Walker (specify type) ADL Screening (condition at time of admission) Patient's cognitive ability adequate to safely complete daily activities?: Yes Is the patient deaf or have difficulty hearing?: No Does the patient have difficulty seeing, even when wearing glasses/contacts?:  No Does the patient have difficulty concentrating, remembering, or making decisions?: No Patient able to express need for assistance with ADLs?: Yes Does the patient have difficulty dressing or bathing?: No Independently performs ADLs?: Yes (appropriate for developmental age) Does the patient have difficulty walking or climbing stairs?: Yes (fracture) Weakness of Legs: Left Weakness of Arms/Hands: None  Permission Sought/Granted                  Emotional Assessment Appearance:: Appears stated age Attitude/Demeanor/Rapport: Engaged Affect (typically observed): Appropriate Orientation: : Oriented to  Time, Oriented to Situation, Oriented to Place, Oriented to Self Alcohol / Substance Use: Not Applicable Psych Involvement: No (comment)  Admission diagnosis:  Atrial fibrillation with RVR ([place]) [I48.91] Patient Active Problem List   Diagnosis Date Noted   Atrial fibrillation with RVR ([place]) 03/21/2021   Elevated troponin 03/21/2021   Leukocytosis 03/21/2021   Diarrhea 03/21/2021   Generalized weakness 03/21/2021   Dehydration 03/21/2021   Congestive heart failure ([place]) 11/10/2020   Encephalopathy acute 10/30/2020   Tibia/fibula fracture, left, closed, initial encounter 10/30/2020   Subdural hematoma 10/30/2020   Rhabdomyolysis 10/30/2020   Sepsis ([place]) 05/23/2019   ARF (acute renal failure) ([place]) 05/23/2019   Weight loss 02/04/2014   Cerebral infarction ([place]) 05/16/2012   Cardiomyopathy ([place]) 02/03/2012   Preventative health care 01/26/2012   Artery occlusion, 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 [address] Phone: [phone] Fax: [phone]  [facility] [place], [place] - [address] [place] [address] Phone: [phone] Fax: [phone]     Social Determinants of Health (SDOH) Interventions    Readmission Risk Interventions No flowsheet data found.

## 2021-03-25 NOTE — Progress Notes (Signed)
PROGRESS NOTE  Austin Andrade SAY:[id number] DOB: 1950-06-02   PCP: Sandi Mariscal, MD  Patient is from: Home  DOA: 03/21/2021 LOS: 4  Chief complaints:  Chief Complaint  Patient presents with   Tachycardia     Brief Narrative / Interim history: 70 year old F with PMH of PAF, HTN, HLD, moderate MVR and BPH presenting with diarrhea and admitted for A. fib with RVR and possible diverticulitis/colitis.  CT scan suggestive for diverticulitis versus colitis.  Patient was started on Cardizem drip and IV Zosyn.    Patient's RVR resolved and he was transitioned to p.o. meds.  He was on IV Zosyn for sepsis due to possible diverticulitis.  However, patient has not had further bowel movements.  Therapy recommended SNF   Subjective: Seen and examined earlier this morning.  No major events overnight of this morning.  No complaints other than difficulty falling asleep.  He denies pain, dyspnea, GI or UTI symptoms.  Objective: Vitals:   03/25/21 0303 03/25/21 0545 03/25/21 0820 03/25/21 1116  BP: 133/77  120/78 126/80  Pulse: 68  73 76  Resp: 15  15 13   Temp: 97.9 F (36.6 C)  97.6 F (36.4 C) 97.8 F (36.6 C)  TempSrc: Oral  Oral Oral  SpO2: 96%  97% 93%  Weight:  61.7 kg    Height:        Intake/Output Summary (Last 24 hours) at 03/25/2021 1400 Last data filed at 03/25/2021 2355 Gross per 24 hour  Intake 924.35 ml  Output 575 ml  Net 349.35 ml   Filed Weights   03/23/21 0549 03/24/21 0613 03/25/21 0545  Weight: 62.7 kg 61 kg 61.7 kg    Examination:  GENERAL: Frail looking elderly male.  No apparent distress. HEENT: MMM.  Vision and hearing grossly intact.  NECK: Supple.  No apparent JVD.  RESP: 93% on RA.  No IWOB.  Fair aeration bilaterally. CVS:  RRR. Heart sounds normal.  ABD/GI/GU: BS+. Abd soft, NTND.  MSK/EXT:  Moves extremities. No apparent deformity. No edema.  SKIN: no apparent skin lesion or wound NEURO: Awake and alert. Oriented appropriately.  No apparent focal  neuro deficit. PSYCH: Calm. Normal affect.   Procedures:  None  Microbiology summarized: DDUKG-25 and influenza PCR nonreactive. Blood cultures NGTD.  Assessment & Plan: Paroxysmal A. fib with RVR: RVR resolved.  CHA2DS2-VASc score 5. -On metoprolol and Xarelto per cardiology.   Sepsis due to acute diverticulitis?  Patient presents with diarrhea for 3 days.  CT shows bowel wall thickening of the sigmoid colon concerning for diverticulitis versus colitis.  Blood cultures NGTD.  Sepsis physiology resolved. -De-escalated antibiotic to IV Unasyn.  Will discharge on p.o. Augmentin for a total of 10 days -Needs colonoscopy once he recovers from diverticulitis   Dehydration: Likely due to the above.  Resolved.   Essential Hypertension: Normotensive -Continue Toprol   Hyperlipidemia: documented h/o such.  -Continue Crestor   BPH without LUTS -Continue on Flomax    Chronic neck pain/depression-seems to have bright affect today. -Continue home pain regimen-narcotic database reviewed. -Continue home gabapentin at reduced dose -Continue home baclofen.  History of systolic CHF with recovered EF: Appears euvolemic. -Most recent TTE basically normal except for mildly elevated RVSP.   History of right PCA and left cerebellar CVA in 2013 Generalized weakness: Likely due to the above. Recent left lower extremity fracture -Therapy recommends SNF. -TOC consulted for assistance with placement    Body mass index is 20.68 kg/m.  DVT prophylaxis:  SCDs Start: 03/21/21 2103 rivaroxaban (XARELTO) tablet 20 mg  Code Status: DNR/DNI Family Communication: Patient states he has no family members Level of care: Telemetry Medical Status is: Inpatient  Remains inpatient appropriate because: Safe disposition/SNF   Final disposition: Likely SNF.    Consultants:  Cardiology   Sch Meds:  Scheduled Meds:  DULoxetine  30 mg Oral Daily   gabapentin  600 mg Oral TID    metoprolol tartrate  50 mg Oral BID   rivaroxaban  20 mg Oral Q supper   rosuvastatin  5 mg Oral Q supper   tamsulosin  0.4 mg Oral QPM   Continuous Infusions:  ampicillin-sulbactam (UNASYN) IV 3 g (03/25/21 0809)   PRN Meds:.acetaminophen **OR** acetaminophen, baclofen, fentaNYL (SUBLIMAZE) injection, naLOXone (NARCAN)  injection, oxyCODONE, traMADol  Antimicrobials: Anti-infectives (From admission, onward)    Start     Dose/Rate Route Frequency Ordered Stop   03/24/21 0845  Ampicillin-Sulbactam (UNASYN) 3 g in sodium chloride 0.9 % 100 mL IVPB        3 g 200 mL/hr over 30 Minutes Intravenous Every 6 hours 03/24/21 0752 03/21/2021 0844   03/21/21 2230  piperacillin-tazobactam (ZOSYN) IVPB 3.375 g  Status:  Discontinued        3.375 g 12.5 mL/hr over 240 Minutes Intravenous Every 8 hours 03/21/21 2226 03/24/21 0752   03/21/21 2045  piperacillin-tazobactam (ZOSYN) IVPB 3.375 g        3.375 g 100 mL/hr over 30 Minutes Intravenous  Once 03/21/21 2033 03/21/21 2143        I have personally reviewed the following labs and images: CBC: Recent Labs  Lab 03/21/21 1649 03/21/21 1722 03/22/21 0418 03/23/21 0119 03/24/21 0425 03/25/21 0310  WBC 12.4*  --  13.5* 12.2* 10.6* 10.7*  NEUTROABS  --   --  10.6*  --   --   --   HGB 14.0 15.0 14.1 12.2* 12.4* 12.4*  HCT 43.5 44.0 43.7 37.4* 37.1* 37.9*  MCV 93.5  --  94.8 93.3 92.1 93.8  PLT 329  --  276 253 228 208   BMP &GFR Recent Labs  Lab 03/21/21 1649 03/21/21 1722 03/22/21 0418 03/23/21 0119 03/24/21 0425 03/25/21 0310  NA 139 141 137 135 136 136  K 4.1 4.3 4.0 4.3 4.3 4.1  CL 108 108 107 106 105 106  CO2 22  --  20* 21* 22 23  GLUCOSE 112* 105* 113* 119* 147* 176*  BUN 25* 26* 18 17 25* 22  CREATININE 0.88 0.80 0.78 0.88 0.93 0.92  CALCIUM 9.1  --  9.0 9.0 8.8* 8.8*  MG 2.0  --  2.0  --   --  2.3  PHOS  --   --   --   --   --  3.1   Estimated Creatinine Clearance: 64.3 mL/min (by C-G formula based on SCr of 0.92  mg/dL). Liver & Pancreas: Recent Labs  Lab 03/21/21 1649 03/22/21 0418 03/25/21 0310  AST 24 19  --   ALT 9 11  --   ALKPHOS 87 102  --   BILITOT 0.5 0.8  --   PROT 6.9 7.2  --   ALBUMIN 3.3* 3.4* 2.8*   Recent Labs  Lab 03/21/21 1649  LIPASE 24   Recent Labs  Lab 03/21/21 1655  AMMONIA 15   Diabetic: No results for input(s): HGBA1C in the last 72 hours. No results for input(s): GLUCAP in the last 168 hours. Cardiac Enzymes: No results for  input(s): CKTOTAL, CKMB, CKMBINDEX, TROPONINI in the last 168 hours. No results for input(s): PROBNP in the last 8760 hours. Coagulation Profile: No results for input(s): INR, PROTIME in the last 168 hours. Thyroid Function Tests: Recent Labs    03/23/21 0119  FREET4 0.91   Lipid Profile: No results for input(s): CHOL, HDL, LDLCALC, TRIG, CHOLHDL, LDLDIRECT in the last 72 hours. Anemia Panel: No results for input(s): VITAMINB12, FOLATE, FERRITIN, TIBC, IRON, RETICCTPCT in the last 72 hours. Urine analysis:    Component Value Date/Time   COLORURINE AMBER (A) 03/21/2021 2010   APPEARANCEUR CLOUDY (A) 03/21/2021 2010   LABSPEC 1.015 03/21/2021 2010   PHURINE 5.5 03/21/2021 2010   GLUCOSEU NEGATIVE 03/21/2021 2010   HGBUR NEGATIVE 03/21/2021 2010   [place] NEGATIVE 03/21/2021 2010   [place] NEGATIVE 03/21/2021 2010   PROTEINUR NEGATIVE 03/21/2021 2010   UROBILINOGEN 1.0 09/01/2013 1753   NITRITE POSITIVE (A) 03/21/2021 2010   LEUKOCYTESUR NEGATIVE 03/21/2021 2010   Sepsis Labs: Invalid input(s): PROCALCITONIN, [place]  Microbiology: Recent Results (from the past 240 hour(s))  Culture, blood (routine x 2)     Status: None (Preliminary result)   Collection Time: 03/21/21  8:50 PM   Specimen: BLOOD LEFT ARM  Result Value Ref Range Status   Specimen Description BLOOD LEFT ARM  Final   Special Requests   Final    BOTTLES DRAWN AEROBIC AND ANAEROBIC Blood Culture results may not be optimal due to an inadequate  volume of blood received in culture bottles   Culture   Final    NO GROWTH 4 DAYS Performed at [facility], [place] [address] [place], [place] [address]    Report Status PENDING  Incomplete  Culture, blood (routine x 2)     Status: None (Preliminary result)   Collection Time: 03/21/21  8:53 PM   Specimen: BLOOD RIGHT ARM  Result Value Ref Range Status   Specimen Description BLOOD RIGHT ARM  Final   Special Requests   Final    BOTTLES DRAWN AEROBIC AND ANAEROBIC Blood Culture results may not be optimal due to an inadequate volume of blood received in culture bottles   Culture   Final    NO GROWTH 4 DAYS Performed at [facility], [place] [address] [place], [place] [address]    Report Status PENDING  Incomplete  Resp Panel by RT-PCR (Flu A&B, Covid) Nasopharyngeal Swab     Status: None   Collection Time: 03/21/21  8:59 PM   Specimen: Nasopharyngeal Swab; Nasopharyngeal(NP) swabs in vial transport medium  Result Value Ref Range Status   SARS Coronavirus 2 by RT PCR NEGATIVE NEGATIVE Final    Comment: (NOTE) SARS-CoV-2 target nucleic acids are NOT DETECTED.  The SARS-CoV-2 RNA is generally detectable in upper respiratory specimens during the acute phase of infection. The lowest concentration of SARS-CoV-2 viral copies this assay can detect is 138 copies/mL. A negative result does not preclude SARS-Cov-2 infection and should not be used as the sole basis for treatment or other patient management decisions. A negative result may occur with  improper specimen collection/handling, submission of specimen other than nasopharyngeal swab, presence of viral mutation(s) within the areas targeted by this assay, and inadequate number of viral copies(<138 copies/mL). A negative result must be combined with clinical observations, patient history, and epidemiological information. The expected result is Negative.  Fact Sheet for Patients:   EntrepreneurPulse.com.au  Fact Sheet for Healthcare Providers:  IncredibleEmployment.be  This test is no t yet approved  or cleared by the [place] and  has been authorized for detection and/or diagnosis of SARS-CoV-2 by FDA under an Emergency Use Authorization (EUA). This EUA will remain  in effect (meaning this test can be used) for the duration of the COVID-19 declaration under Section 564(b)(1) of the Act, 21 U.S.C.section 360bbb-3(b)(1), unless the authorization is terminated  or revoked sooner.       Influenza A by PCR NEGATIVE NEGATIVE Final   Influenza B by PCR NEGATIVE NEGATIVE Final    Comment: (NOTE) The Xpert Xpress SARS-CoV-2/FLU/RSV plus assay is intended as an aid in the diagnosis of influenza from Nasopharyngeal swab specimens and should not be used as a sole basis for treatment. Nasal washings and aspirates are unacceptable for Xpert Xpress SARS-CoV-2/FLU/RSV testing.  Fact Sheet for Patients: EntrepreneurPulse.com.au  Fact Sheet for Healthcare Providers: IncredibleEmployment.be  This test is not yet approved or cleared by the [place] FDA and has been authorized for detection and/or diagnosis of SARS-CoV-2 by FDA under an Emergency Use Authorization (EUA). This EUA will remain in effect (meaning this test can be used) for the duration of the COVID-19 declaration under Section 564(b)(1) of the Act, 21 U.S.C. section 360bbb-3(b)(1), unless the authorization is terminated or revoked.  Performed at [facility], [place] [address] [place], [place] [address]     Radiology Studies: No results found.    Christian Borgerding T. [place]  If 7PM-7AM, please contact night-coverage [email or web address] 03/25/2021, 2:00 PM

## 2021-03-26 DIAGNOSIS — K5900 Constipation, unspecified: Secondary | ICD-10-CM

## 2021-03-26 LAB — CULTURE, BLOOD (ROUTINE X 2)
Culture: NO GROWTH
Culture: NO GROWTH

## 2021-03-26 MED ORDER — SENNOSIDES-DOCUSATE SODIUM 8.6-50 MG PO TABS: 1.0000 | ORAL_TABLET | Freq: Two times a day (BID) | ORAL | Status: DC | PRN

## 2021-03-26 MED ORDER — SENNOSIDES-DOCUSATE SODIUM 8.6-50 MG PO TABS
1.0000 | ORAL_TABLET | Freq: Two times a day (BID) | ORAL | Status: AC
  Administered 2021-03-26 – 2021-03-27 (×4): 1 via ORAL
  Filled 2021-03-26 (×4): qty 1

## 2021-03-26 MED ORDER — POLYETHYLENE GLYCOL 3350 17 G PO PACK
17.0000 g | PACK | Freq: Every day | ORAL | Status: DC
  Administered 2021-03-26: 17 g via ORAL
  Filled 2021-03-26 (×4): qty 1

## 2021-03-26 MED ORDER — OXYCODONE HCL 5 MG PO TABS
15.0000 mg | ORAL_TABLET | Freq: Four times a day (QID) | ORAL | Status: DC | PRN
  Administered 2021-03-26 – 2021-03-29 (×8): 15 mg via ORAL
  Filled 2021-03-26 (×9): qty 3

## 2021-03-26 MED ORDER — OXYCODONE HCL 5 MG PO TABS
15.0000 mg | ORAL_TABLET | Freq: Three times a day (TID) | ORAL | Status: DC | PRN
  Administered 2021-03-26: 15 mg via ORAL
  Filled 2021-03-26: qty 3

## 2021-03-26 NOTE — Progress Notes (Signed)
Occupational Therapy Treatment Patient Details Name: Austin Andrade MRN: [id number] DOB: 14-Feb-1951 Today's Date: 03/26/2021   History of present illness Pt is a 70 y.o. male admitted on 03/21/21 with atrial fibrilation with RVR and diarrhea. CXR negative. CT showed possbile colitis or diverticulitis as well as cholelithiasis. PMH includes: ischemic stroke (residual hemianopsia and balance deficits), L tibial fx s/p external fixation 10/30/20, chronic neck pain, paroxysmal a-fib, HLD, HTN, mitral regugitation, CHF, aneurysm.   OT comments  Pt seen this session to address safety with transfers, toileting, and grooming at the sink. Overall pt demonstrating increased strength and balance, completing toileting, grooming, and functional mobility with min guard to min A. Continuing to recommend SNF level therapies to maximize pt's independence. Pt would be able to return home, if he had assist with meals, intermittent supervision (especially with bathing), and further assessment to medication management to ensure safety of this activity at home. OT will continue to follow acutely.    Recommendations for follow up therapy are one component of a multi-disciplinary discharge planning process, led by the attending physician.  Recommendations may be updated based on patient status, additional functional criteria and insurance authorization.    Follow Up Recommendations  Skilled nursing-short term rehab (<3 hours/day)    Assistance Recommended at Discharge Frequent or constant Supervision/Assistance  Equipment Recommendations  Other (comment) (TBD)    Recommendations for Other Services      Precautions / Restrictions Precautions Precautions: Fall Restrictions Weight Bearing Restrictions: Yes LLE Weight Bearing: Partial weight bearing LLE Partial Weight Bearing Percentage or Pounds: 50       Mobility Bed Mobility Overal bed mobility: Modified Independent Bed Mobility: Supine to Sit;Sit to Supine      Supine to sit: Supervision Sit to supine: Supervision   General bed mobility comments: increased time    Transfers Overall transfer level: Needs assistance Equipment used: Rolling walker (2 wheels) Transfers: Sit to/from Stand Sit to Stand: Min guard;Min assist           General transfer comment: PT requiring min A for lower surface of the toilet, min g for bed surface.     Balance Overall balance assessment: Needs assistance Sitting-balance support: No upper extremity supported;Feet supported Sitting balance-Leahy Scale: Good     Standing balance support: Single extremity supported;During functional activity Standing balance-Leahy Scale: Fair Standing balance comment: able to static stand without UE support; reliant on RW during ambulation, does best withh 1 UE support during dynamic standing tasks.                           ADL either performed or assessed with clinical judgement   ADL Overall ADL's : Needs assistance/impaired     Grooming: Min guard;Standing Grooming Details (indicate cue type and reason): completed at sink, does best when having 1UE supported on the sink.                 Toilet Transfer: Magazine features editor Details (indicate cue type and reason): Pt overall moving well, needs increased assist for line management and safety with balance. Toileting- Clothing Manipulation and Hygiene: Minimal assistance;Sit to/from stand;Sitting/lateral lean Toileting - Clothing Manipulation Details (indicate cue type and reason): Increased assist needed for line managment and balance     Functional mobility during ADLs: Min guard;Minimal assistance;Rolling walker (2 wheels) General ADL Comments: Pt limited by lines, poor balance, and pain.    Extremity/Trunk Assessment  Vision       Perception     Praxis      Cognition Arousal/Alertness: Awake/alert Behavior During Therapy: WFL for tasks  assessed/performed Overall Cognitive Status: No family/caregiver present to determine baseline cognitive functioning                                 General Comments: Pt started session very resistive and irritable, as session progressed pt more cooperative.          Exercises     Shoulder Instructions       General Comments VSS on RA, HR 66 at rest and highest noted 83 with light activity in room.    Pertinent Vitals/ Pain       Pain Assessment: Faces Faces Pain Scale: Hurts little more Pain Location: Neck and RLE with ambulation Pain Descriptors / Indicators: Aching;Discomfort;Grimacing;Tightness Pain Intervention(s): Monitored during session;Repositioned  Home Living                                          Prior Functioning/Environment              Frequency  Min 2X/week        Progress Toward Goals  OT Goals(current goals can now be found in the care plan section)  Progress towards OT goals: Progressing toward goals  Acute Rehab OT Goals Patient Stated Goal: to be fully independent again. OT Goal Formulation: With patient Time For Goal Achievement: 04/07/21 Potential to Achieve Goals: Good ADL Goals Pt Will Perform Grooming: with modified independence;standing Pt Will Perform Lower Body Bathing: with min assist;with adaptive equipment;sitting/lateral leans;sit to/from stand Pt Will Perform Lower Body Dressing: with min assist;with adaptive equipment;sitting/lateral leans;sit to/from stand Pt Will Transfer to Toilet: with min assist;ambulating Pt Will Perform Toileting - Clothing Manipulation and hygiene: with min assist;with adaptive equipment;sitting/lateral leans;sit to/from stand Additional ADL Goal #1: Pt will complete dynamic standing task for 5 mins to improve activity tolerance. Additional ADL Goal #2: Pt will verbalize 3 energy conservation techniques that he can utilize at home.  Plan Discharge plan remains  appropriate;Frequency remains appropriate    Co-evaluation                 AM-PAC OT "6 Clicks" Daily Activity     Outcome Measure   Help from another person eating meals?: A Little Help from another person taking care of personal grooming?: A Little Help from another person toileting, which includes using toliet, bedpan, or urinal?: A Little Help from another person bathing (including washing, rinsing, drying)?: A Lot Help from another person to put on and taking off regular upper body clothing?: A Little Help from another person to put on and taking off regular lower body clothing?: A Lot 6 Click Score: 16    End of Session Equipment Utilized During Treatment: Gait belt;Rolling walker (2 wheels)  OT Visit Diagnosis: Unsteadiness on feet (R26.81);Muscle weakness (generalized) (M62.81);Other abnormalities of gait and mobility (R26.89)   Activity Tolerance Patient tolerated treatment well   Patient Left in bed;with call bell/phone within reach;with bed alarm set   Nurse Communication Mobility status        Time: 1027-2536 OT Time Calculation (min): 13 min  Charges: OT General Charges $OT Visit: 1 Visit OT Treatments $Self Care/Home Management : 8-22 mins  Maudy Yonan H.,  OTR/L Acute Rehabilitation  Evelean Bigler Elane Yolanda Bonine 03/26/2021, 11:16 AM

## 2021-03-26 NOTE — Plan of Care (Signed)
  Problem: Education: Goal: Knowledge of General Education information will improve Description: Including pain rating scale, medication(s)/side effects and non-pharmacologic comfort measures Outcome: Progressing   Problem: Health Behavior/Discharge Planning: Goal: Ability to manage health-related needs will improve Outcome: Progressing   Problem: Clinical Measurements: Goal: Cardiovascular complication will be avoided Outcome: Progressing   Problem: Elimination: Goal: Will not experience complications related to bowel motility Outcome: Progressing   Problem: Pain Managment: Goal: General experience of comfort will improve Outcome: Progressing

## 2021-03-26 NOTE — Progress Notes (Signed)
PROGRESS NOTE  Austin Andrade NGE:[id number] DOB: 10-Oct-1950   PCP: Sandi Mariscal, MD  Patient is from: Home  DOA: 03/21/2021 LOS: 5  Chief complaints:  Chief Complaint  Patient presents with   Tachycardia     Brief Narrative / Interim history: 70 year old F with PMH of PAF, HTN, HLD, moderate MVR and BPH presenting with diarrhea and admitted for A. fib with RVR and possible diverticulitis/colitis.  CT scan suggestive for diverticulitis versus colitis.  Patient was started on Cardizem drip and IV Zosyn.    Patient's RVR resolved and he was transitioned to p.o. meds.  He was on IV Zosyn for sepsis due to possible diverticulitis.  However, patient has not had further bowel movements.  Therapy recommended SNF.  Medically stable for discharge.   Subjective: Seen and examined earlier this morning.  No major events overnight of this morning.  Asking about his pain medication.  He says his pain doctor increased his oxycodone to 4 times a day.  Otherwise, no complaints.  Has not a bowel movement yet.  Objective: Vitals:   03/26/21 0341 03/26/21 0500 03/26/21 0728 03/26/21 1109  BP: 120/72  120/69 126/79  Pulse: 65  68 68  Resp: 14  17 18   Temp: 98.8 F (37.1 C)  98.9 F (37.2 C) 97.6 F (36.4 C)  TempSrc: Oral     SpO2: 93%  95% 94%  Weight:  62.7 kg    Height:        Intake/Output Summary (Last 24 hours) at 03/26/2021 1242 Last data filed at 03/26/2021 1216 Gross per 24 hour  Intake 1160 ml  Output 750 ml  Net 410 ml   Filed Weights   03/24/21 0613 03/25/21 0545 03/26/21 0500  Weight: 61 kg 61.7 kg 62.7 kg    Examination:  GENERAL: Frail looking elderly male.  No apparent distress. HEENT: MMM.  Vision and hearing grossly intact.  NECK: Supple.  No apparent JVD.  RESP: 94% on RA.  No IWOB.  Fair aeration bilaterally. CVS:  RRR. Heart sounds normal.  ABD/GI/GU: BS+. Abd soft, NTND.  MSK/EXT:  Moves extremities. No apparent deformity.  Lower extremity brace in place. SKIN:  no apparent skin lesion or wound NEURO: Awake and alert. Oriented appropriately.  No apparent focal neuro deficit. PSYCH: Calm. Normal affect.   Procedures:  None  Microbiology summarized: MWNUU-72 and influenza PCR nonreactive. Blood cultures NGTD.  Assessment & Plan: Paroxysmal A. fib with RVR: RVR resolved.  CHA2DS2-VASc score 5. -On metoprolol and Xarelto per cardiology.   Sepsis due to acute diverticulitis?  Patient presents with diarrhea for 3 days.  CT shows bowel wall thickening of the sigmoid colon concerning for diverticulitis versus colitis.  Has not a bowel movement here.  Blood cultures NGTD.  Sepsis physiology resolved. -De-escalated antibiotic to IV Unasyn.  Will discharge on p.o. Augmentin for a total of 10 days -Needs colonoscopy once he recovers from diverticulitis   Dehydration: Likely due to the above.  Resolved.   Essential Hypertension: Normotensive -Continue Toprol   Hyperlipidemia: documented h/o such.  -Continue Crestor   BPH without LUTS -Continue on Flomax    Chronic neck pain/depression-seems to have bright affect today. -Increased oxycodone to 15 mg 3 times daily.  He takes 4 times daily at home. -Continue home gabapentin at reduced dose -Continue home baclofen. -Bowel regimen-MiraLAX and Senokot-S  History of systolic CHF with recovered EF: Appears euvolemic. -Most recent TTE basically normal except for mildly elevated RVSP.   History of right  PCA and left cerebellar CVA in 2013 Generalized weakness/physical deconditioning: Likely due to the above. Recent left lower extremity fracture -Therapy recommends SNF. -TOC consulted for assistance with placement  Constipation -Bowel regimen as above.      Body mass index is 21.02 kg/m.         DVT prophylaxis:  SCDs Start: 03/21/21 2103 rivaroxaban (XARELTO) tablet 20 mg  Code Status: DNR/DNI Family Communication: Patient states he has no family members Level of care: Telemetry  Medical Status is: Inpatient  Remains inpatient appropriate because: Safe disposition/SNF   Final disposition: Likely SNF.    Consultants:  Cardiology   Sch Meds:  Scheduled Meds:  DULoxetine  30 mg Oral Daily   gabapentin  600 mg Oral TID   metoprolol tartrate  50 mg Oral BID   polyethylene glycol  17 g Oral Daily   rivaroxaban  20 mg Oral Q supper   rosuvastatin  5 mg Oral Q supper   senna-docusate  1 tablet Oral BID   tamsulosin  0.4 mg Oral QPM   Continuous Infusions:  ampicillin-sulbactam (UNASYN) IV 3 g (03/26/21 0855)   PRN Meds:.acetaminophen **OR** acetaminophen, baclofen, naLOXone (NARCAN)  injection, oxyCODONE, senna-docusate **FOLLOWED BY** [START ON 03/28/2021] senna-docusate  Antimicrobials: Anti-infectives (From admission, onward)    Start     Dose/Rate Route Frequency Ordered Stop   03/24/21 0845  Ampicillin-Sulbactam (UNASYN) 3 g in sodium chloride 0.9 % 100 mL IVPB        3 g 200 mL/hr over 30 Minutes Intravenous Every 6 hours 03/24/21 0752 04/13/2021 0844   03/21/21 2230  piperacillin-tazobactam (ZOSYN) IVPB 3.375 g  Status:  Discontinued        3.375 g 12.5 mL/hr over 240 Minutes Intravenous Every 8 hours 03/21/21 2226 03/24/21 0752   03/21/21 2045  piperacillin-tazobactam (ZOSYN) IVPB 3.375 g        3.375 g 100 mL/hr over 30 Minutes Intravenous  Once 03/21/21 2033 03/21/21 2143        I have personally reviewed the following labs and images: CBC: Recent Labs  Lab 03/21/21 1649 03/21/21 1722 03/22/21 0418 03/23/21 0119 03/24/21 0425 03/25/21 0310  WBC 12.4*  --  13.5* 12.2* 10.6* 10.7*  NEUTROABS  --   --  10.6*  --   --   --   HGB 14.0 15.0 14.1 12.2* 12.4* 12.4*  HCT 43.5 44.0 43.7 37.4* 37.1* 37.9*  MCV 93.5  --  94.8 93.3 92.1 93.8  PLT 329  --  276 253 228 208   BMP &GFR Recent Labs  Lab 03/21/21 1649 03/21/21 1722 03/22/21 0418 03/23/21 0119 03/24/21 0425 03/25/21 0310  NA 139 141 137 135 136 136  K 4.1 4.3 4.0 4.3 4.3  4.1  CL 108 108 107 106 105 106  CO2 22  --  20* 21* 22 23  GLUCOSE 112* 105* 113* 119* 147* 176*  BUN 25* 26* 18 17 25* 22  CREATININE 0.88 0.80 0.78 0.88 0.93 0.92  CALCIUM 9.1  --  9.0 9.0 8.8* 8.8*  MG 2.0  --  2.0  --   --  2.3  PHOS  --   --   --   --   --  3.1   Estimated Creatinine Clearance: 65.3 mL/min (by C-G formula based on SCr of 0.92 mg/dL). Liver & Pancreas: Recent Labs  Lab 03/21/21 1649 03/22/21 0418 03/25/21 0310  AST 24 19  --   ALT 9 11  --   ALKPHOS  87 102  --   BILITOT 0.5 0.8  --   PROT 6.9 7.2  --   ALBUMIN 3.3* 3.4* 2.8*   Recent Labs  Lab 03/21/21 1649  LIPASE 24   Recent Labs  Lab 03/21/21 1655  AMMONIA 15   Diabetic: No results for input(s): HGBA1C in the last 72 hours. No results for input(s): GLUCAP in the last 168 hours. Cardiac Enzymes: No results for input(s): CKTOTAL, CKMB, CKMBINDEX, TROPONINI in the last 168 hours. No results for input(s): PROBNP in the last 8760 hours. Coagulation Profile: No results for input(s): INR, PROTIME in the last 168 hours. Thyroid Function Tests: No results for input(s): TSH, T4TOTAL, FREET4, T3FREE, THYROIDAB in the last 72 hours.  Lipid Profile: No results for input(s): CHOL, HDL, LDLCALC, TRIG, CHOLHDL, LDLDIRECT in the last 72 hours. Anemia Panel: No results for input(s): VITAMINB12, FOLATE, FERRITIN, TIBC, IRON, RETICCTPCT in the last 72 hours. Urine analysis:    Component Value Date/Time   COLORURINE AMBER (A) 03/21/2021 2010   APPEARANCEUR CLOUDY (A) 03/21/2021 2010   LABSPEC 1.015 03/21/2021 2010   PHURINE 5.5 03/21/2021 2010   GLUCOSEU NEGATIVE 03/21/2021 2010   HGBUR NEGATIVE 03/21/2021 2010   [place] NEGATIVE 03/21/2021 2010   [place] NEGATIVE 03/21/2021 2010   PROTEINUR NEGATIVE 03/21/2021 2010   UROBILINOGEN 1.0 09/01/2013 1753   NITRITE POSITIVE (A) 03/21/2021 2010   LEUKOCYTESUR NEGATIVE 03/21/2021 2010   Sepsis Labs: Invalid input(s): PROCALCITONIN,  [place]  Microbiology: Recent Results (from the past 240 hour(s))  Culture, blood (routine x 2)     Status: None   Collection Time: 03/21/21  8:50 PM   Specimen: BLOOD LEFT ARM  Result Value Ref Range Status   Specimen Description BLOOD LEFT ARM  Final   Special Requests   Final    BOTTLES DRAWN AEROBIC AND ANAEROBIC Blood Culture results may not be optimal due to an inadequate volume of blood received in culture bottles   Culture   Final    NO GROWTH 5 DAYS Performed at [facility], [place] [address] [place], [place] [address]    Report Status 03/26/2021 FINAL  Final  Culture, blood (routine x 2)     Status: None   Collection Time: 03/21/21  8:53 PM   Specimen: BLOOD RIGHT ARM  Result Value Ref Range Status   Specimen Description BLOOD RIGHT ARM  Final   Special Requests   Final    BOTTLES DRAWN AEROBIC AND ANAEROBIC Blood Culture results may not be optimal due to an inadequate volume of blood received in culture bottles   Culture   Final    NO GROWTH 5 DAYS Performed at [facility], [place] [address], [place], [place] [address]    Report Status 03/26/2021 FINAL  Final  Resp Panel by RT-PCR (Flu A&B, Covid) Nasopharyngeal Swab     Status: None   Collection Time: 03/21/21  8:59 PM   Specimen: Nasopharyngeal Swab; Nasopharyngeal(NP) swabs in vial transport medium  Result Value Ref Range Status   SARS Coronavirus 2 by RT PCR NEGATIVE NEGATIVE Final    Comment: (NOTE) SARS-CoV-2 target nucleic acids are NOT DETECTED.  The SARS-CoV-2 RNA is generally detectable in upper respiratory specimens during the acute phase of infection. The lowest concentration of SARS-CoV-2 viral copies this assay can detect is 138 copies/mL. A negative result does not preclude SARS-Cov-2 infection and should not be used as the sole basis for treatment or other patient management decisions. A negative result  may occur with  improper specimen collection/handling, submission of specimen  other than nasopharyngeal swab, presence of viral mutation(s) within the areas targeted by this assay, and inadequate number of viral copies(<138 copies/mL). A negative result must be combined with clinical observations, patient history, and epidemiological information. The expected result is Negative.  Fact Sheet for Patients:  EntrepreneurPulse.com.au  Fact Sheet for Healthcare Providers:  IncredibleEmployment.be  This test is no t yet approved or cleared by the [place] FDA and  has been authorized for detection and/or diagnosis of SARS-CoV-2 by FDA under an Emergency Use Authorization (EUA). This EUA will remain  in effect (meaning this test can be used) for the duration of the COVID-19 declaration under Section 564(b)(1) of the Act, 21 U.S.C.section 360bbb-3(b)(1), unless the authorization is terminated  or revoked sooner.       Influenza A by PCR NEGATIVE NEGATIVE Final   Influenza B by PCR NEGATIVE NEGATIVE Final    Comment: (NOTE) The Xpert Xpress SARS-CoV-2/FLU/RSV plus assay is intended as an aid in the diagnosis of influenza from Nasopharyngeal swab specimens and should not be used as a sole basis for treatment. Nasal washings and aspirates are unacceptable for Xpert Xpress SARS-CoV-2/FLU/RSV testing.  Fact Sheet for Patients: EntrepreneurPulse.com.au  Fact Sheet for Healthcare Providers: IncredibleEmployment.be  This test is not yet approved or cleared by the [place] FDA and has been authorized for detection and/or diagnosis of SARS-CoV-2 by FDA under an Emergency Use Authorization (EUA). This EUA will remain in effect (meaning this test can be used) for the duration of the COVID-19 declaration under Section 564(b)(1) of the Act, 21 U.S.C. section 360bbb-3(b)(1), unless the authorization is terminated or revoked.  Performed at [facility], [place] [address] [place],  [place] [address]     Radiology Studies: No results found.    Keisy Strickler T. [place]  If 7PM-7AM, please contact night-coverage [email or web address] 03/26/2021, 12:42 PM

## 2021-03-26 NOTE — Plan of Care (Signed)

## 2021-03-26 NOTE — Discharge Instructions (Signed)

## 2021-03-26 NOTE — Progress Notes (Signed)
Physical Therapy Treatment Patient Details Name: Austin Andrade MRN: [id number] DOB: 02-06-1951 Today's Date: 03/26/2021   History of Present Illness Pt is a 70 y.o. male admitted on 03/21/21 with atrial fibrilation with RVR and diarrhea. CXR negative. CT showed possbile colitis or diverticulitis as well as cholelithiasis. PMH includes: ischemic stroke (residual hemianopsia and balance deficits), L tibial fx s/p external fixation 10/30/20, chronic neck pain, paroxysmal a-fib, HLD, HTN, mitral regugitation, CHF, aneurysm.   PT Comments    Pt continues to progress with mobility; pt remains limited by generalized weakness, decreased activity tolerance, and poor balance. AMPAC score is 19, however, pt continues to request SNF due to safety concerns returning home with no assist available; pt continues to require increased assist for self-care tasks. Will continue to follow acutely to address short-term PT goals.      Recommendations for follow up therapy are one component of a multi-disciplinary discharge planning process, led by the attending physician.  Recommendations may be updated based on patient status, additional functional criteria and insurance authorization.  Follow Up Recommendations  Skilled nursing-short term rehab (<3 hours/day) (per pt request due to safety concerns)     Assistance Recommended at Discharge Intermittent Supervision/Assistance  Equipment Recommendations  Rolling walker (2 wheels)    Recommendations for Other Services       Precautions / Restrictions Precautions Precautions: Fall Required Braces or Orthoses: Other Brace (RLE boot) Restrictions Weight Bearing Restrictions: Yes LLE Weight Bearing: Partial weight bearing LLE Partial Weight Bearing Percentage or Pounds: 50% (per pt report)     Mobility  Bed Mobility Overal bed mobility: Modified Independent Bed Mobility: Supine to Sit           General bed mobility comments: increased time     Transfers Overall transfer level: Needs assistance Equipment used: Rolling walker (2 wheels) Transfers: Sit to/from Stand Sit to Stand: Supervision                Ambulation/Gait Ambulation/Gait assistance: Min Gaffer (Feet): 290 Feet Assistive device: Rolling walker (2 wheels) Gait Pattern/deviations: Decreased stride length;Decreased weight shift to left;Knee flexed in stance - left;Step-to pattern Gait velocity: decreased     General Gait Details: heavy reliance on RW to offload LLE; cues for upright posture; min guard progressing to supervision for safety   Stairs             Wheelchair Mobility    Modified Rankin (Stroke Patients Only)       Balance Overall balance assessment: Needs assistance Sitting-balance support: No upper extremity supported;Feet supported Sitting balance-Leahy Scale: Good     Standing balance support: Single extremity supported;During functional activity;Bilateral upper extremity supported Standing balance-Leahy Scale: Fair Standing balance comment: able to static stand without UE support; reliant on RW during ambulation. Modified independent for posterior pericare with 1 UE on walker                            Cognition Arousal/Alertness: Awake/alert Behavior During Therapy: Flat affect Overall Cognitive Status: No family/caregiver present to determine baseline cognitive functioning Area of Impairment: Safety/judgement                         Safety/Judgement: Decreased awareness of safety     General Comments: Pt not as conversational today but cooperative        Exercises      General Comments General comments (skin integrity, edema,  etc.): Discussed discharge recommendations again today. Pt continues to request SNF. Discussed how this may not be an option and we will continue to plan for home in case it isn't.      Pertinent Vitals/Pain Pain Assessment:  Faces Faces Pain Scale: Hurts little more Pain Location: neck Pain Descriptors / Indicators: Aching;Discomfort;Grimacing;Tightness Pain Intervention(s): Monitored during session;Limited activity within patient's tolerance    Home Living                          Prior Function            PT Goals (current goals can now be found in the care plan section) Acute Rehab PT Goals Patient Stated Goal: to get more rehab before returning home PT Goal Formulation: With patient Time For Goal Achievement: 04/06/21 Potential to Achieve Goals: Good Progress towards PT goals: Progressing toward goals    Frequency    Min 3X/week      PT Plan Current plan remains appropriate    Co-evaluation              AM-PAC PT "6 Clicks" Mobility   Outcome Measure  Help needed turning from your back to your side while in a flat bed without using bedrails?: None Help needed moving from lying on your back to sitting on the side of a flat bed without using bedrails?: None Help needed moving to and from a bed to a chair (including a wheelchair)?: A Little Help needed standing up from a chair using your arms (e.g., wheelchair or bedside chair)?: A Little Help needed to walk in hospital room?: A Little Help needed climbing 3-5 steps with a railing? : A Lot 6 Click Score: 19    End of Session Equipment Utilized During Treatment: Gait belt Activity Tolerance: Patient tolerated treatment well Patient left: in bed;with call bell/phone within reach Nurse Communication: Mobility status PT Visit Diagnosis: Unsteadiness on feet (R26.81);Other abnormalities of gait and mobility (R26.89);Muscle weakness (generalized) (M62.81);History of falling (Z91.81)     Time: [id number] PT Time Calculation (min) (ACUTE ONLY): 21 min  Charges:  $Therapeutic Exercise: 8-22 mins                     Brandon Melnick, SPT    Brandon Melnick 03/26/2021, 4:56 PM

## 2021-03-26 NOTE — Progress Notes (Signed)
Pt had prescription oxycodone from home hidden in the room. The oxycodone was taken and pt was made aware that the oxy would be held at pharmacy. The bottle states there is 120 tablets but 103 was accounted for when found.

## 2021-03-27 MED ORDER — AMOXICILLIN-POT CLAVULANATE 875-125 MG PO TABS
1.0000 | ORAL_TABLET | Freq: Two times a day (BID) | ORAL | Status: DC
  Administered 2021-03-27 – 2021-03-29 (×5): 1 via ORAL
  Filled 2021-03-27 (×5): qty 1

## 2021-03-27 NOTE — Progress Notes (Signed)
PROGRESS NOTE  Austin Andrade ZOX:[id number] DOB: 02-04-1951   PCP: Sandi Mariscal, MD  Patient is from: Home  DOA: 03/21/2021 LOS: 6  Chief complaints:  Chief Complaint  Patient presents with   Tachycardia     Brief Narrative / Interim history: 70 year old F with PMH of PAF, HTN, HLD, moderate MVR and BPH presenting with diarrhea and admitted for A. fib with RVR and possible diverticulitis/colitis.  CT scan suggestive for diverticulitis versus colitis.  Patient was started on Cardizem drip and IV Zosyn.    Patient's RVR resolved and he was transitioned to p.o. meds.  He was on IV Zosyn for sepsis due to possible diverticulitis.  However, patient has not had further bowel movements.  Therapy recommended SNF.  Medically stable for discharge.   Subjective: Seen and examined earlier this morning.  No major events overnight of this morning.  No complaints.  Pain fairly controlled.  Has not had bowel movements yet but he says it is about to happen.  Denies chest pain or dyspnea.  Objective: Vitals:   03/27/21 0308 03/27/21 0503 03/27/21 0757 03/27/21 1100  BP: 123/81  132/86 (!) 121/100  Pulse: 64  68 65  Resp: 17  19 15   Temp: 98.5 F (36.9 C)  98.7 F (37.1 C) 97.9 F (36.6 C)  TempSrc: Oral  Oral Oral  SpO2: 95%  94% 94%  Weight:  64.7 kg    Height:        Intake/Output Summary (Last 24 hours) at 03/27/2021 1222 Last data filed at 03/27/2021 0954 Gross per 24 hour  Intake 360 ml  Output 570 ml  Net -210 ml   Filed Weights   03/25/21 0545 03/26/21 0500 03/27/21 0503  Weight: 61.7 kg 62.7 kg 64.7 kg    Examination:  GENERAL: Frail looking elderly male.  No apparent distress. HEENT: MMM.  Vision and hearing grossly intact.  NECK: Supple.  No apparent JVD.  RESP: 94% on RA.  No IWOB.  Fair aeration bilaterally. CVS:  RRR. Heart sounds normal.  ABD/GI/GU: BS+. Abd soft, NTND.  MSK/EXT:  Moves extremities. No apparent deformity. No edema.  SKIN: no apparent skin lesion  or wound NEURO: Awake and alert. Oriented appropriately.  No apparent focal neuro deficit. PSYCH: Calm. Normal affect.   Procedures:  None  Microbiology summarized: [id number] and influenza PCR nonreactive. Blood cultures NGTD.  Assessment & Plan: Paroxysmal A. fib with RVR: RVR resolved.  CHA2DS2-VASc score 5. -On metoprolol and Xarelto per cardiology.   Sepsis due to acute diverticulitis?  Patient presents with diarrhea for 3 days.  CT shows bowel wall thickening of the sigmoid colon concerning for diverticulitis versus colitis.  Has not a bowel movement here.  Blood cultures NGTD.  Sepsis physiology resolved. -12/4 Zosyn>> 12/7 Unasyn> 12/10 Augmentin>>> to complete total of 10 days course. -Needs colonoscopy once he recovers from diverticulitis   Dehydration: Likely due to the above.  Resolved.   Essential Hypertension: Normotensive for most part. -Continue Toprol   Hyperlipidemia: documented h/o such.  -Continue Crestor   BPH without LUTS -Continue on Flomax    Chronic neck pain/depression-seems to have bright affect today. -Continue home oxycodone -Continue home gabapentin at reduced dose -Continue home baclofen. -Bowel regimen-MiraLAX and Senokot-S  History of systolic CHF with recovered EF: Appears euvolemic. -Most recent TTE basically normal except for mildly elevated RVSP.   History of right PCA and left cerebellar CVA in 2013 Generalized weakness/physical deconditioning: Likely due to the above. Recent left lower extremity  fracture -Therapy recommends SNF. -TOC consulted for assistance with placement  Constipation -Bowel regimen as above.      Body mass index is 21.69 kg/m.         DVT prophylaxis:  SCDs Start: 03/21/21 2103 rivaroxaban (XARELTO) tablet 20 mg  Code Status: DNR/DNI Family Communication: Patient states he has no family members Level of care: Telemetry Medical Status is: Inpatient  Remains inpatient appropriate because: Safe  disposition/SNF   Final disposition: SNF.  Medically stable for discharge.    Consultants:  Cardiology   Sch Meds:  Scheduled Meds:  amoxicillin-clavulanate  1 tablet Oral Q12H   DULoxetine  30 mg Oral Daily   gabapentin  600 mg Oral TID   metoprolol tartrate  50 mg Oral BID   polyethylene glycol  17 g Oral Daily   rivaroxaban  20 mg Oral Q supper   rosuvastatin  5 mg Oral Q supper   senna-docusate  1 tablet Oral BID   tamsulosin  0.4 mg Oral QPM   Continuous Infusions:   PRN Meds:.acetaminophen **OR** acetaminophen, baclofen, naLOXone (NARCAN)  injection, oxyCODONE, senna-docusate **FOLLOWED BY** [START ON 03/28/2021] senna-docusate  Antimicrobials: Anti-infectives (From admission, onward)    Start     Dose/Rate Route Frequency Ordered Stop   03/27/21 1000  amoxicillin-clavulanate (AUGMENTIN) 875-125 MG per tablet 1 tablet        1 tablet Oral Every 12 hours 03/27/21 0642 03/31/21 0959   03/24/21 0845  Ampicillin-Sulbactam (UNASYN) 3 g in sodium chloride 0.9 % 100 mL IVPB  Status:  Discontinued        3 g 200 mL/hr over 30 Minutes Intravenous Every 6 hours 03/24/21 0752 03/27/21 0642   03/21/21 2230  piperacillin-tazobactam (ZOSYN) IVPB 3.375 g  Status:  Discontinued        3.375 g 12.5 mL/hr over 240 Minutes Intravenous Every 8 hours 03/21/21 2226 03/24/21 0752   03/21/21 2045  piperacillin-tazobactam (ZOSYN) IVPB 3.375 g        3.375 g 100 mL/hr over 30 Minutes Intravenous  Once 03/21/21 2033 03/21/21 2143        I have personally reviewed the following labs and images: CBC: Recent Labs  Lab 03/21/21 1649 03/21/21 1722 03/22/21 0418 03/23/21 0119 03/24/21 0425 03/25/21 0310  WBC 12.4*  --  13.5* 12.2* 10.6* 10.7*  NEUTROABS  --   --  10.6*  --   --   --   HGB 14.0 15.0 14.1 12.2* 12.4* 12.4*  HCT 43.5 44.0 43.7 37.4* 37.1* 37.9*  MCV 93.5  --  94.8 93.3 92.1 93.8  PLT 329  --  276 253 228 208   BMP &GFR Recent Labs  Lab 03/21/21 1649  03/21/21 1722 03/22/21 0418 03/23/21 0119 03/24/21 0425 03/25/21 0310  NA 139 141 137 135 136 136  K 4.1 4.3 4.0 4.3 4.3 4.1  CL 108 108 107 106 105 106  CO2 22  --  20* 21* 22 23  GLUCOSE 112* 105* 113* 119* 147* 176*  BUN 25* 26* 18 17 25* 22  CREATININE 0.88 0.80 0.78 0.88 0.93 0.92  CALCIUM 9.1  --  9.0 9.0 8.8* 8.8*  MG 2.0  --  2.0  --   --  2.3  PHOS  --   --   --   --   --  3.1   Estimated Creatinine Clearance: 67.4 mL/min (by C-G formula based on SCr of 0.92 mg/dL). Liver & Pancreas: Recent Labs  Lab 03/21/21 1649 03/22/21  0418 03/25/21 0310  AST 24 19  --   ALT 9 11  --   ALKPHOS 87 102  --   BILITOT 0.5 0.8  --   PROT 6.9 7.2  --   ALBUMIN 3.3* 3.4* 2.8*   Recent Labs  Lab 03/21/21 1649  LIPASE 24   Recent Labs  Lab 03/21/21 1655  AMMONIA 15   Diabetic: No results for input(s): HGBA1C in the last 72 hours. No results for input(s): GLUCAP in the last 168 hours. Cardiac Enzymes: No results for input(s): CKTOTAL, CKMB, CKMBINDEX, TROPONINI in the last 168 hours. No results for input(s): PROBNP in the last 8760 hours. Coagulation Profile: No results for input(s): INR, PROTIME in the last 168 hours. Thyroid Function Tests: No results for input(s): TSH, T4TOTAL, FREET4, T3FREE, THYROIDAB in the last 72 hours.  Lipid Profile: No results for input(s): CHOL, HDL, LDLCALC, TRIG, CHOLHDL, LDLDIRECT in the last 72 hours. Anemia Panel: No results for input(s): VITAMINB12, FOLATE, FERRITIN, TIBC, IRON, RETICCTPCT in the last 72 hours. Urine analysis:    Component Value Date/Time   COLORURINE AMBER (A) 03/21/2021 2010   APPEARANCEUR CLOUDY (A) 03/21/2021 2010   LABSPEC 1.015 03/21/2021 2010   PHURINE 5.5 03/21/2021 2010   GLUCOSEU NEGATIVE 03/21/2021 2010   HGBUR NEGATIVE 03/21/2021 2010   [place] NEGATIVE 03/21/2021 2010   [place] NEGATIVE 03/21/2021 2010   PROTEINUR NEGATIVE 03/21/2021 2010   UROBILINOGEN 1.0 09/01/2013 1753   NITRITE POSITIVE  (A) 03/21/2021 2010   LEUKOCYTESUR NEGATIVE 03/21/2021 2010   Sepsis Labs: Invalid input(s): PROCALCITONIN, [place]  Microbiology: Recent Results (from the past 240 hour(s))  Culture, blood (routine x 2)     Status: None   Collection Time: 03/21/21  8:50 PM   Specimen: BLOOD LEFT ARM  Result Value Ref Range Status   Specimen Description BLOOD LEFT ARM  Final   Special Requests   Final    BOTTLES DRAWN AEROBIC AND ANAEROBIC Blood Culture results may not be optimal due to an inadequate volume of blood received in culture bottles   Culture   Final    NO GROWTH 5 DAYS Performed at [facility], [place] [address] [place], [place] [address]    Report Status 03/26/2021 FINAL  Final  Culture, blood (routine x 2)     Status: None   Collection Time: 03/21/21  8:53 PM   Specimen: BLOOD RIGHT ARM  Result Value Ref Range Status   Specimen Description BLOOD RIGHT ARM  Final   Special Requests   Final    BOTTLES DRAWN AEROBIC AND ANAEROBIC Blood Culture results may not be optimal due to an inadequate volume of blood received in culture bottles   Culture   Final    NO GROWTH 5 DAYS Performed at [facility], [place] [address], [place], [place] [address]    Report Status 03/26/2021 FINAL  Final  Resp Panel by RT-PCR (Flu A&B, Covid) Nasopharyngeal Swab     Status: None   Collection Time: 03/21/21  8:59 PM   Specimen: Nasopharyngeal Swab; Nasopharyngeal(NP) swabs in vial transport medium  Result Value Ref Range Status   SARS Coronavirus 2 by RT PCR NEGATIVE NEGATIVE Final    Comment: (NOTE) SARS-CoV-2 target nucleic acids are NOT DETECTED.  The SARS-CoV-2 RNA is generally detectable in upper respiratory specimens during the acute phase of infection. The lowest concentration of SARS-CoV-2 viral copies this assay can detect is 138 copies/mL. A negative result does not preclude SARS-Cov-2 infection  and should not be used as the sole basis for treatment or other patient management  decisions. A negative result may occur with  improper specimen collection/handling, submission of specimen other than nasopharyngeal swab, presence of viral mutation(s) within the areas targeted by this assay, and inadequate number of viral copies(<138 copies/mL). A negative result must be combined with clinical observations, patient history, and epidemiological information. The expected result is Negative.  Fact Sheet for Patients:  EntrepreneurPulse.com.au  Fact Sheet for Healthcare Providers:  IncredibleEmployment.be  This test is no t yet approved or cleared by the [place] FDA and  has been authorized for detection and/or diagnosis of SARS-CoV-2 by FDA under an Emergency Use Authorization (EUA). This EUA will remain  in effect (meaning this test can be used) for the duration of the COVID-19 declaration under Section 564(b)(1) of the Act, 21 U.S.C.section 360bbb-3(b)(1), unless the authorization is terminated  or revoked sooner.       Influenza A by PCR NEGATIVE NEGATIVE Final   Influenza B by PCR NEGATIVE NEGATIVE Final    Comment: (NOTE) The Xpert Xpress SARS-CoV-2/FLU/RSV plus assay is intended as an aid in the diagnosis of influenza from Nasopharyngeal swab specimens and should not be used as a sole basis for treatment. Nasal washings and aspirates are unacceptable for Xpert Xpress SARS-CoV-2/FLU/RSV testing.  Fact Sheet for Patients: EntrepreneurPulse.com.au  Fact Sheet for Healthcare Providers: IncredibleEmployment.be  This test is not yet approved or cleared by the [place] FDA and has been authorized for detection and/or diagnosis of SARS-CoV-2 by FDA under an Emergency Use Authorization (EUA). This EUA will remain in effect (meaning this test can be used) for the duration of the COVID-19 declaration under Section 564(b)(1) of the Act, 21 U.S.C. section 360bbb-3(b)(1), unless the  authorization is terminated or revoked.  Performed at [facility], [place] [address], [place], [place] [address]     Radiology Studies: No results found.    Austin Andrade T. Cearfoss  If 7PM-7AM, please contact night-coverage [email or web address] 03/27/2021, 12:22 PM

## 2021-03-27 NOTE — TOC Progression Note (Addendum)
Transition of Care [facility]) - Progression Note    Patient Details  Name: Austin Andrade MRN: [id number] Date of Birth: Aug 22, 1950  Transition of Care [facility]) CM/SW [place], LCSW Phone Number:[phone] 03/27/2021, 1:22 PM  Clinical Narrative:     CSW followed up with Helene Kelp at [facility] about extending a bed off and she informed CSW that they were unable to do so because of past experience with pt.  CSW met with pt and provided him the bed offers of [facility], [facility] and [facility]. CSW attempted to follow up with Kitty at [facility] however was unable to reach her and could not leave message due to VM being full. Facility would need to be confirmed.   Pt is concerned about an oral surgery appointment that is scheduled for he 13th and wants to make sure he will be able to still make that appointment while at a facility. Please clarify with facility.  TOC team will continue to assist with discharge planning needs.   Expected Discharge Plan: [place] Barriers to Discharge: Continued Medical Work up  Expected Discharge Plan and Services Expected Discharge Plan: [place]   Discharge Planning Services: CM Consult   Living arrangements for the past 2 months: Apartment                   DME Agency: NA       HH Arranged: NA           Social Determinants of Health (SDOH) Interventions    Readmission Risk Interventions No flowsheet data found.

## 2021-03-28 MED ORDER — NICOTINE 21 MG/24HR TD PT24
21.0000 mg | MEDICATED_PATCH | Freq: Every day | TRANSDERMAL | Status: DC
Start: 2021-03-28 — End: 2021-03-29
  Filled 2021-03-28 (×2): qty 1

## 2021-03-28 NOTE — Progress Notes (Signed)
PROGRESS NOTE  Austin Andrade:[id number] DOB: [birth year]   PCP: Sandi Mariscal, MD  Patient is from: Home  DOA: 03/21/2021 LOS: 7  Chief complaints:  Chief Complaint  Patient presents with   Tachycardia     Brief Narrative / Interim history: 70 year old F with PMH of PAF, HTN, HLD, moderate MVR and BPH presenting with diarrhea and admitted for A. fib with RVR and possible diverticulitis/colitis.  CT scan suggestive for diverticulitis versus colitis.  Patient was started on Cardizem drip and IV Zosyn.    Patient's RVR resolved and he was transitioned to p.o. meds.  He was on IV Zosyn for sepsis due to possible diverticulitis.  However, patient has not had further bowel movements.  Therapy recommended SNF.  Medically stable for discharge.   Subjective: Seen and examined earlier this morning.  No major events overnight of this morning.  No complaints.  Objective: Vitals:   03/28/21 0352 03/28/21 0355 03/28/21 0725 03/28/21 1141  BP: 130/72  131/82 123/62  Pulse:      Resp: 18 12 19 12   Temp: 98.5 F (36.9 C)  98.7 F (37.1 C) 97.8 F (36.6 C)  TempSrc: Oral  Oral Oral  SpO2:   97% 97%  Weight:  64 kg    Height:        Intake/Output Summary (Last 24 hours) at 03/28/2021 1441 Last data filed at 03/28/2021 0726 Gross per 24 hour  Intake 480 ml  Output 200 ml  Net 280 ml   Filed Weights   03/26/21 0500 03/27/21 0503 03/28/21 0355  Weight: 62.7 kg 64.7 kg 64 kg    Examination  GENERAL: Frail looking elderly male.  No apparent distress. HEENT: MMM.  Vision and hearing grossly intact.  NECK: Supple.  No apparent JVD.  RESP: 97% on RA.  No IWOB.  Fair aeration bilaterally. CVS:  RRR. Heart sounds normal.  ABD/GI/GU: BS+. Abd soft, NTND.  MSK/EXT:  Moves extremities. No apparent deformity.  Brace over LLE. SKIN: no apparent skin lesion or wound NEURO: Awake but not alert.  Oriented appropriately.  No apparent focal neuro deficit. PSYCH: Calm. Normal affect.    Procedures:  None  Microbiology summarized: [id number] and influenza PCR nonreactive. Blood cultures NGTD.  Assessment & Plan: Paroxysmal A. fib with RVR: RVR resolved.  CHA2DS2-VASc score 5. -On metoprolol and Xarelto   Sepsis due to acute diverticulitis?  Patient presents with diarrhea for 3 days.  CT shows bowel wall thickening of the sigmoid colon concerning for diverticulitis versus colitis.  Has not a bowel movement here.  Blood cultures NGTD.  Sepsis physiology resolved. -12/4 Zosyn>> 12/7 Unasyn> 12/10 Augmentin>>> to complete total of 10 days course. -Needs colonoscopy once he recovers from diverticulitis   Dehydration: Likely due to the above.  Resolved.   Essential Hypertension: Normotensive for most part. -Continue Toprol   Hyperlipidemia: documented h/o such.  -Continue Crestor   BPH without LUTS -Continue on Flomax    Chronic neck pain/depression-seems to have bright affect today. -Continue home oxycodone and baclofen. -Continue home gabapentin at reduced dose -Bowel regimen-MiraLAX and Senokot-S  History of systolic CHF with recovered EF: Appears euvolemic. -Most recent TTE basically normal except for mildly elevated RVSP.   History of right PCA and left cerebellar CVA in 2013 Generalized weakness/physical deconditioning: Likely due to the above. Recent left lower extremity fracture -Therapy recommends SNF. -TOC consulted for assistance with placement  Constipation: Resolved. -Bowel regimen as above.      Body mass index is  21.45 kg/m.         DVT prophylaxis:  SCDs Start: 03/21/21 2103 rivaroxaban (XARELTO) tablet 20 mg  Code Status: DNR/DNI Family Communication: Patient states he has no family members Level of care: Telemetry Medical Status is: Inpatient  Remains inpatient appropriate because: Safe disposition/SNF   Final disposition: SNF.  Medically stable for discharge.    Consultants:  Cardiology   Sch Meds:  Scheduled  Meds:  amoxicillin-clavulanate  1 tablet Oral Q12H   DULoxetine  30 mg Oral Daily   gabapentin  600 mg Oral TID   metoprolol tartrate  50 mg Oral BID   polyethylene glycol  17 g Oral Daily   rivaroxaban  20 mg Oral Q supper   rosuvastatin  5 mg Oral Q supper   tamsulosin  0.4 mg Oral QPM   Continuous Infusions:   PRN Meds:.acetaminophen **OR** acetaminophen, baclofen, naLOXone (NARCAN)  injection, oxyCODONE, [COMPLETED] senna-docusate **FOLLOWED BY** senna-docusate  Antimicrobials: Anti-infectives (From admission, onward)    Start     Dose/Rate Route Frequency Ordered Stop   03/27/21 1000  amoxicillin-clavulanate (AUGMENTIN) 875-125 MG per tablet 1 tablet        1 tablet Oral Every 12 hours 03/27/21 0642 03/31/21 0959   03/24/21 0845  Ampicillin-Sulbactam (UNASYN) 3 g in sodium chloride 0.9 % 100 mL IVPB  Status:  Discontinued        3 g 200 mL/hr over 30 Minutes Intravenous Every 6 hours 03/24/21 0752 03/27/21 0642   03/21/21 2230  piperacillin-tazobactam (ZOSYN) IVPB 3.375 g  Status:  Discontinued        3.375 g 12.5 mL/hr over 240 Minutes Intravenous Every 8 hours 03/21/21 2226 03/24/21 0752   03/21/21 2045  piperacillin-tazobactam (ZOSYN) IVPB 3.375 g        3.375 g 100 mL/hr over 30 Minutes Intravenous  Once 03/21/21 2033 03/21/21 2143        I have personally reviewed the following labs and images: CBC: Recent Labs  Lab 03/21/21 1649 03/21/21 1722 03/22/21 0418 03/23/21 0119 03/24/21 0425 03/25/21 0310  WBC 12.4*  --  13.5* 12.2* 10.6* 10.7*  NEUTROABS  --   --  10.6*  --   --   --   HGB 14.0 15.0 14.1 12.2* 12.4* 12.4*  HCT 43.5 44.0 43.7 37.4* 37.1* 37.9*  MCV 93.5  --  94.8 93.3 92.1 93.8  PLT 329  --  276 253 228 208   BMP &GFR Recent Labs  Lab 03/21/21 1649 03/21/21 1722 03/22/21 0418 03/23/21 0119 03/24/21 0425 03/25/21 0310  NA 139 141 137 135 136 136  K 4.1 4.3 4.0 4.3 4.3 4.1  CL 108 108 107 106 105 106  CO2 22  --  20* 21* 22 23   GLUCOSE 112* 105* 113* 119* 147* 176*  BUN 25* 26* 18 17 25* 22  CREATININE 0.88 0.80 0.78 0.88 0.93 0.92  CALCIUM 9.1  --  9.0 9.0 8.8* 8.8*  MG 2.0  --  2.0  --   --  2.3  PHOS  --   --   --   --   --  3.1   Estimated Creatinine Clearance: 66.7 mL/min (by C-G formula based on SCr of 0.92 mg/dL). Liver & Pancreas: Recent Labs  Lab 03/21/21 1649 03/22/21 0418 03/25/21 0310  AST 24 19  --   ALT 9 11  --   ALKPHOS 87 102  --   BILITOT 0.5 0.8  --   PROT 6.9 7.2  --  ALBUMIN 3.3* 3.4* 2.8*   Recent Labs  Lab 03/21/21 1649  LIPASE 24   Recent Labs  Lab 03/21/21 1655  AMMONIA 15   Diabetic: No results for input(s): HGBA1C in the last 72 hours. No results for input(s): GLUCAP in the last 168 hours. Cardiac Enzymes: No results for input(s): CKTOTAL, CKMB, CKMBINDEX, TROPONINI in the last 168 hours. No results for input(s): PROBNP in the last 8760 hours. Coagulation Profile: No results for input(s): INR, PROTIME in the last 168 hours. Thyroid Function Tests: No results for input(s): TSH, T4TOTAL, FREET4, T3FREE, THYROIDAB in the last 72 hours.  Lipid Profile: No results for input(s): CHOL, HDL, LDLCALC, TRIG, CHOLHDL, LDLDIRECT in the last 72 hours. Anemia Panel: No results for input(s): VITAMINB12, FOLATE, FERRITIN, TIBC, IRON, RETICCTPCT in the last 72 hours. Urine analysis:    Component Value Date/Time   COLORURINE AMBER (A) 03/21/2021 2010   APPEARANCEUR CLOUDY (A) 03/21/2021 2010   LABSPEC 1.015 03/21/2021 2010   PHURINE 5.5 03/21/2021 2010   GLUCOSEU NEGATIVE 03/21/2021 2010   HGBUR NEGATIVE 03/21/2021 2010   [place] NEGATIVE 03/21/2021 2010   [place] NEGATIVE 03/21/2021 2010   PROTEINUR NEGATIVE 03/21/2021 2010   UROBILINOGEN 1.0 09/01/2013 1753   NITRITE POSITIVE (A) 03/21/2021 2010   LEUKOCYTESUR NEGATIVE 03/21/2021 2010   Sepsis Labs: Invalid input(s): PROCALCITONIN, [place]  Microbiology: Recent Results (from the past 240 hour(s))   Culture, blood (routine x 2)     Status: None   Collection Time: 03/21/21  8:50 PM   Specimen: BLOOD LEFT ARM  Result Value Ref Range Status   Specimen Description BLOOD LEFT ARM  Final   Special Requests   Final    BOTTLES DRAWN AEROBIC AND ANAEROBIC Blood Culture results may not be optimal due to an inadequate volume of blood received in culture bottles   Culture   Final    NO GROWTH 5 DAYS Performed at [facility], [place] [address], [place], [place] [address]    Report Status 03/26/2021 FINAL  Final  Culture, blood (routine x 2)     Status: None   Collection Time: 03/21/21  8:53 PM   Specimen: BLOOD RIGHT ARM  Result Value Ref Range Status   Specimen Description BLOOD RIGHT ARM  Final   Special Requests   Final    BOTTLES DRAWN AEROBIC AND ANAEROBIC Blood Culture results may not be optimal due to an inadequate volume of blood received in culture bottles   Culture   Final    NO GROWTH 5 DAYS Performed at [facility], [place] [address], [place], [place] [address]    Report Status 03/26/2021 FINAL  Final  Resp Panel by RT-PCR (Flu A&B, Covid) Nasopharyngeal Swab     Status: None   Collection Time: 03/21/21  8:59 PM   Specimen: Nasopharyngeal Swab; Nasopharyngeal(NP) swabs in vial transport medium  Result Value Ref Range Status   SARS Coronavirus 2 by RT PCR NEGATIVE NEGATIVE Final    Comment: (NOTE) SARS-CoV-2 target nucleic acids are NOT DETECTED.  The SARS-CoV-2 RNA is generally detectable in upper respiratory specimens during the acute phase of infection. The lowest concentration of SARS-CoV-2 viral copies this assay can detect is 138 copies/mL. A negative result does not preclude SARS-Cov-2 infection and should not be used as the sole basis for treatment or other patient management decisions. A negative result may occur with  improper specimen collection/handling, submission of specimen other than nasopharyngeal swab, presence of viral mutation(s) within  the areas targeted by this assay, and inadequate number of viral copies(<138 copies/mL). A negative result must be combined with clinical observations, patient history, and epidemiological information. The expected result is Negative.  Fact Sheet for Patients:  EntrepreneurPulse.com.au  Fact Sheet for Healthcare Providers:  IncredibleEmployment.be  This test is no t yet approved or cleared by the [place] FDA and  has been authorized for detection and/or diagnosis of SARS-CoV-2 by FDA under an Emergency Use Authorization (EUA). This EUA will remain  in effect (meaning this test can be used) for the duration of the COVID-19 declaration under Section 564(b)(1) of the Act, 21 U.S.C.section 360bbb-3(b)(1), unless the authorization is terminated  or revoked sooner.       Influenza A by PCR NEGATIVE NEGATIVE Final   Influenza B by PCR NEGATIVE NEGATIVE Final    Comment: (NOTE) The Xpert Xpress SARS-CoV-2/FLU/RSV plus assay is intended as an aid in the diagnosis of influenza from Nasopharyngeal swab specimens and should not be used as a sole basis for treatment. Nasal washings and aspirates are unacceptable for Xpert Xpress SARS-CoV-2/FLU/RSV testing.  Fact Sheet for Patients: EntrepreneurPulse.com.au  Fact Sheet for Healthcare Providers: IncredibleEmployment.be  This test is not yet approved or cleared by the [place] FDA and has been authorized for detection and/or diagnosis of SARS-CoV-2 by FDA under an Emergency Use Authorization (EUA). This EUA will remain in effect (meaning this test can be used) for the duration of the COVID-19 declaration under Section 564(b)(1) of the Act, 21 U.S.C. section 360bbb-3(b)(1), unless the authorization is terminated or revoked.  Performed at [facility], [place] [address] [place], [place] [address]     Radiology Studies: No results found.    Valerie Fredin  T. [place]  If 7PM-7AM, please contact night-coverage [email or web address] 03/28/2021, 2:41 PM

## 2021-03-28 NOTE — Progress Notes (Signed)
   03/28/21 1443  Mobility  Activity Refused mobility (Patient declined mobility for unspecified reasons. Kept eyes closed throughout conversation and interacted minimally.)

## 2021-03-29 DIAGNOSIS — Z8673 Personal history of transient ischemic attack (TIA), and cerebral infarction without residual deficits: Secondary | ICD-10-CM

## 2021-03-29 DIAGNOSIS — M542 Cervicalgia: Secondary | ICD-10-CM

## 2021-03-29 DIAGNOSIS — G8929 Other chronic pain: Secondary | ICD-10-CM

## 2021-03-29 MED ORDER — SENNOSIDES-DOCUSATE SODIUM 8.6-50 MG PO TABS: 1.0000 | ORAL_TABLET | Freq: Two times a day (BID) | ORAL | Status: AC | PRN

## 2021-03-29 MED ORDER — AMOXICILLIN-POT CLAVULANATE 875-125 MG PO TABS: 1.0000 | ORAL_TABLET | Freq: Two times a day (BID) | ORAL | 0 refills | Status: AC

## 2021-03-29 MED ORDER — ROSUVASTATIN CALCIUM 5 MG PO TABS: 5.0000 mg | ORAL_TABLET | Freq: Every day | ORAL | 1 refills | Status: AC

## 2021-03-29 MED ORDER — GABAPENTIN 600 MG PO TABS
600.0000 mg | ORAL_TABLET | Freq: Three times a day (TID) | ORAL | Status: AC
Start: 2021-03-29 — End: ?

## 2021-03-29 MED ORDER — NICOTINE 21 MG/24HR TD PT24: 21.0000 mg | MEDICATED_PATCH | Freq: Every day | TRANSDERMAL | 0 refills | Status: AC

## 2021-03-29 NOTE — Progress Notes (Addendum)
PROGRESS NOTE  Dayvian Blixt EVO:[id number] DOB: 1950/04/22   PCP: Sandi Mariscal, MD  Patient is from: Home  DOA: 03/21/2021 LOS: 8  Chief complaints:  Chief Complaint  Patient presents with   Tachycardia     Brief Narrative / Interim history: 70 year old F with PMH of PAF, HTN, HLD, moderate MVR and BPH presenting with diarrhea and admitted for A. fib with RVR and possible diverticulitis/colitis.  CT scan suggestive for diverticulitis versus colitis.  Patient was started on Cardizem drip and IV Zosyn.    Patient's RVR resolved and he was transitioned to p.o. meds.  He was on IV Zosyn for sepsis due to possible diverticulitis.  However, patient has not had further bowel movements.  Therapy recommended SNF.  Medically stable for discharge.   Subjective: Seen and examined earlier this morning.  No major events overnight of this morning.  No complaints.  He denies pain, shortness of breath, nausea or vomiting.  Asking for a cup of ice cream.  Objective: Vitals:   03/28/21 1924 03/28/21 2239 03/29/21 0534 03/29/21 0810  BP: 123/70 134/76  130/72  Pulse:   87 70  Resp: 14 17 17 18   Temp: 98 F (36.7 C) 98 F (36.7 C) 97.8 F (36.6 C) 98.4 F (36.9 C)  TempSrc: Oral Oral Oral Oral  SpO2: 97% 97% 97% 95%  Weight:   64.9 kg   Height:        Intake/Output Summary (Last 24 hours) at 03/29/2021 1126 Last data filed at 03/29/2021 0830 Gross per 24 hour  Intake --  Output 525 ml  Net -525 ml   Filed Weights   03/27/21 0503 03/28/21 0355 03/29/21 0534  Weight: 64.7 kg 64 kg 64.9 kg    Examination  GENERAL: Frail looking elderly male.  No apparent distress. HEENT: MMM.  Vision and hearing grossly intact.  NECK: Supple.  No apparent JVD.  RESP: 95% on RA.  No IWOB.  Fair aeration bilaterally. CVS:  RRR. Heart sounds normal.  ABD/GI/GU: BS+. Abd soft, NTND.  MSK/EXT:  Moves extremities. No apparent deformity.  Brace over LLE. SKIN: no apparent skin lesion or wound NEURO:  Awake and alert. Oriented appropriately.  No apparent focal neuro deficit. PSYCH: Calm. Normal affect.   Procedures:  None  Microbiology summarized: [id number] and influenza PCR nonreactive. Blood cultures NGTD.  Assessment & Plan: Paroxysmal A. fib with RVR: RVR resolved.  CHA2DS2-VASc score 5. -On metoprolol and Xarelto   Sepsis due to acute diverticulitis?  Patient presents with diarrhea for 3 days.  CT shows bowel wall thickening of the sigmoid colon concerning for diverticulitis versus colitis.  Has not a bowel movement here.  Blood cultures NGTD.  Sepsis physiology resolved. -12/4 Zosyn>> 12/7 Unasyn> 12/10 Augmentin>> 10/13 -Needs colonoscopy once he recovers from diverticulitis  Elevated troponin: Felt to be demand ischemia from RVR and sepsis.  Patient had no chest pain.  EKG without acute ischemic finding. -Continue Toprol, statin and Xarelto.   Dehydration: Likely due to the above.  Resolved.   Essential Hypertension: Normotensive for most part. -Continue Toprol   Hyperlipidemia: documented h/o such.  -Continue Crestor   BPH without LUTS -Continue on Flomax    Chronic neck pain/depression-seems to have bright affect today. -Continue home oxycodone and baclofen. -Continue home gabapentin at reduced dose -Bowel regimen-MiraLAX and Senokot-S  History of systolic CHF with recovered EF: Appears euvolemic. -Most recent TTE basically normal except for mildly elevated RVSP.   History of right PCA and left cerebellar  CVA in 2013 Generalized weakness/physical deconditioning: Likely due to the above. Recent left lower extremity fracture -Therapy recommends SNF. -TOC consulted for assistance with placement  Constipation: Resolved. -Bowel regimen as above.      Body mass index is 21.76 kg/m.         DVT prophylaxis:  SCDs Start: 03/21/21 2103 rivaroxaban (XARELTO) tablet 20 mg  Code Status: DNR/DNI Family Communication: Patient states he has no family  members Level of care: Telemetry Medical Status is: Inpatient  Remains inpatient appropriate because: Safe disposition/SNF   Final disposition: SNF.  Medically stable for discharge.    Consultants:  Cardiology   Sch Meds:  Scheduled Meds:  amoxicillin-clavulanate  1 tablet Oral Q12H   DULoxetine  30 mg Oral Daily   gabapentin  600 mg Oral TID   metoprolol tartrate  50 mg Oral BID   nicotine  21 mg Transdermal Daily   polyethylene glycol  17 g Oral Daily   rivaroxaban  20 mg Oral Q supper   rosuvastatin  5 mg Oral Q supper   tamsulosin  0.4 mg Oral QPM   Continuous Infusions:   PRN Meds:.acetaminophen **OR** acetaminophen, baclofen, naLOXone (NARCAN)  injection, oxyCODONE, [COMPLETED] senna-docusate **FOLLOWED BY** senna-docusate  Antimicrobials: Anti-infectives (From admission, onward)    Start     Dose/Rate Route Frequency Ordered Stop   03/27/21 1000  amoxicillin-clavulanate (AUGMENTIN) 875-125 MG per tablet 1 tablet        1 tablet Oral Every 12 hours 03/27/21 0642 03/31/21 0959   03/24/21 0845  Ampicillin-Sulbactam (UNASYN) 3 g in sodium chloride 0.9 % 100 mL IVPB  Status:  Discontinued        3 g 200 mL/hr over 30 Minutes Intravenous Every 6 hours 03/24/21 0752 03/27/21 0642   03/21/21 2230  piperacillin-tazobactam (ZOSYN) IVPB 3.375 g  Status:  Discontinued        3.375 g 12.5 mL/hr over 240 Minutes Intravenous Every 8 hours 03/21/21 2226 03/24/21 0752   03/21/21 2045  piperacillin-tazobactam (ZOSYN) IVPB 3.375 g        3.375 g 100 mL/hr over 30 Minutes Intravenous  Once 03/21/21 2033 03/21/21 2143        I have personally reviewed the following labs and images: CBC: Recent Labs  Lab 03/23/21 0119 03/24/21 0425 03/25/21 0310  WBC 12.2* 10.6* 10.7*  HGB 12.2* 12.4* 12.4*  HCT 37.4* 37.1* 37.9*  MCV 93.3 92.1 93.8  PLT 253 228 208   BMP &GFR Recent Labs  Lab 03/23/21 0119 03/24/21 0425 03/25/21 0310  NA 135 136 136  K 4.3 4.3 4.1  CL 106  105 106  CO2 21* 22 23  GLUCOSE 119* 147* 176*  BUN 17 25* 22  CREATININE 0.88 0.93 0.92  CALCIUM 9.0 8.8* 8.8*  MG  --   --  2.3  PHOS  --   --  3.1   Estimated Creatinine Clearance: 67.6 mL/min (by C-G formula based on SCr of 0.92 mg/dL). Liver & Pancreas: Recent Labs  Lab 03/25/21 0310  ALBUMIN 2.8*   No results for input(s): LIPASE, AMYLASE in the last 168 hours.  No results for input(s): AMMONIA in the last 168 hours.  Diabetic: No results for input(s): HGBA1C in the last 72 hours. No results for input(s): GLUCAP in the last 168 hours. Cardiac Enzymes: No results for input(s): CKTOTAL, CKMB, CKMBINDEX, TROPONINI in the last 168 hours. No results for input(s): PROBNP in the last 8760 hours. Coagulation Profile: No results for input(s):  INR, PROTIME in the last 168 hours. Thyroid Function Tests: No results for input(s): TSH, T4TOTAL, FREET4, T3FREE, THYROIDAB in the last 72 hours.  Lipid Profile: No results for input(s): CHOL, HDL, LDLCALC, TRIG, CHOLHDL, LDLDIRECT in the last 72 hours. Anemia Panel: No results for input(s): VITAMINB12, FOLATE, FERRITIN, TIBC, IRON, RETICCTPCT in the last 72 hours. Urine analysis:    Component Value Date/Time   COLORURINE AMBER (A) 03/21/2021 2010   APPEARANCEUR CLOUDY (A) 03/21/2021 2010   LABSPEC 1.015 03/21/2021 2010   PHURINE 5.5 03/21/2021 2010   GLUCOSEU NEGATIVE 03/21/2021 2010   HGBUR NEGATIVE 03/21/2021 2010   [place] NEGATIVE 03/21/2021 2010   [place] NEGATIVE 03/21/2021 2010   PROTEINUR NEGATIVE 03/21/2021 2010   UROBILINOGEN 1.0 09/01/2013 1753   NITRITE POSITIVE (A) 03/21/2021 2010   LEUKOCYTESUR NEGATIVE 03/21/2021 2010   Sepsis Labs: Invalid input(s): PROCALCITONIN, [place]  Microbiology: Recent Results (from the past 240 hour(s))  Culture, blood (routine x 2)     Status: None   Collection Time: 03/21/21  8:50 PM   Specimen: BLOOD LEFT ARM  Result Value Ref Range Status   Specimen Description  BLOOD LEFT ARM  Final   Special Requests   Final    BOTTLES DRAWN AEROBIC AND ANAEROBIC Blood Culture results may not be optimal due to an inadequate volume of blood received in culture bottles   Culture   Final    NO GROWTH 5 DAYS Performed at [facility], [place] [address], [place], [place] [address]    Report Status 03/26/2021 FINAL  Final  Culture, blood (routine x 2)     Status: None   Collection Time: 03/21/21  8:53 PM   Specimen: BLOOD RIGHT ARM  Result Value Ref Range Status   Specimen Description BLOOD RIGHT ARM  Final   Special Requests   Final    BOTTLES DRAWN AEROBIC AND ANAEROBIC Blood Culture results may not be optimal due to an inadequate volume of blood received in culture bottles   Culture   Final    NO GROWTH 5 DAYS Performed at [facility], [place] [address] [place], [place] [address]    Report Status 03/26/2021 FINAL  Final  Resp Panel by RT-PCR (Flu A&B, Covid) Nasopharyngeal Swab     Status: None   Collection Time: 03/21/21  8:59 PM   Specimen: Nasopharyngeal Swab; Nasopharyngeal(NP) swabs in vial transport medium  Result Value Ref Range Status   SARS Coronavirus 2 by RT PCR NEGATIVE NEGATIVE Final    Comment: (NOTE) SARS-CoV-2 target nucleic acids are NOT DETECTED.  The SARS-CoV-2 RNA is generally detectable in upper respiratory specimens during the acute phase of infection. The lowest concentration of SARS-CoV-2 viral copies this assay can detect is 138 copies/mL. A negative result does not preclude SARS-Cov-2 infection and should not be used as the sole basis for treatment or other patient management decisions. A negative result may occur with  improper specimen collection/handling, submission of specimen other than nasopharyngeal swab, presence of viral mutation(s) within the areas targeted by this assay, and inadequate number of viral copies(<138 copies/mL). A negative result must be combined with clinical observations, patient history, and  epidemiological information. The expected result is Negative.  Fact Sheet for Patients:  EntrepreneurPulse.com.au  Fact Sheet for Healthcare Providers:  IncredibleEmployment.be  This test is no t yet approved or cleared by the [place] FDA and  has been authorized for detection and/or diagnosis of SARS-CoV-2 by FDA under an Emergency Use Authorization (  EUA). This EUA will remain  in effect (meaning this test can be used) for the duration of the COVID-19 declaration under Section 564(b)(1) of the Act, 21 U.S.C.section 360bbb-3(b)(1), unless the authorization is terminated  or revoked sooner.       Influenza A by PCR NEGATIVE NEGATIVE Final   Influenza B by PCR NEGATIVE NEGATIVE Final    Comment: (NOTE) The Xpert Xpress SARS-CoV-2/FLU/RSV plus assay is intended as an aid in the diagnosis of influenza from Nasopharyngeal swab specimens and should not be used as a sole basis for treatment. Nasal washings and aspirates are unacceptable for Xpert Xpress SARS-CoV-2/FLU/RSV testing.  Fact Sheet for Patients: EntrepreneurPulse.com.au  Fact Sheet for Healthcare Providers: IncredibleEmployment.be  This test is not yet approved or cleared by the [place] FDA and has been authorized for detection and/or diagnosis of SARS-CoV-2 by FDA under an Emergency Use Authorization (EUA). This EUA will remain in effect (meaning this test can be used) for the duration of the COVID-19 declaration under Section 564(b)(1) of the Act, 21 U.S.C. section 360bbb-3(b)(1), unless the authorization is terminated or revoked.  Performed at [facility], [place] [address] [place], [place] [address]     Radiology Studies: No results found.    Donise Woodle T. [place]  If 7PM-7AM, please contact night-coverage [email or web address] 03/29/2021, 11:26 AM

## 2021-03-29 NOTE — TOC Transition Note (Signed)
Transition of Care [facility]) - CM/SW Discharge Note   Patient Details  Name: Austin Andrade MRN: [id number] Date of Birth: Sep 17, 1950  Transition of Care [facility]) CM/SW Contact:  Zenon Mayo, RN Phone Number: 03/29/2021, 1:03 PM   Clinical Narrative:    Patient is for dc home today with [facility], NCM offered choice he does not have a preference, NCM made referral to [facility] with Alvis Lemmings for [facility] for medication management, HHPT, HHOT, HHAIDE and Education officer, museum.  Tommi Rumps is able to take referral.  Soc will begin 24 to 48 hrs post dc.  Patient states he will need ast with transportation and ast going up the three stairs at his back door.  Secretary will call [facility] safe transport to assist.   Final next level of care: [place] Barriers to Discharge: No Barriers Identified   Patient Goals and CMS Choice Patient states their goals for this hospitalization and ongoing recovery are:: home with [facility] CMS Medicare.gov Compare Post Acute Care list provided to:: Patient Choice offered to / list presented to : Patient  Discharge Placement                       Discharge Plan and Services   Discharge Planning Services: CM Consult              DME Agency: NA       HH Arranged: RN, PT, OT, Nurse's Aide, Social Work [employer] Agency: [place] Date [facility] Agency Contacted: 03/29/21 Time [place]: 1303 Representative spoke with at [place]: [place] ([place]) Interventions     Readmission Risk Interventions No flowsheet data found.

## 2021-03-29 NOTE — Progress Notes (Signed)
Physical Therapy Treatment Patient Details Name: Austin Andrade MRN: [id number] DOB: [birth year] Today's Date: 03/29/2021   History of Present Illness Pt is a 70 y.o. male admitted on 03/21/21 with atrial fibrilation with RVR and diarrhea. CXR negative. CT showed possbile colitis or diverticulitis as well as cholelithiasis. PMH includes: ischemic stroke (residual hemianopsia and balance deficits), L tibial fx s/p external fixation 10/30/20, chronic neck pain, paroxysmal a-fib, HLD, HTN, mitral regugitation, CHF, aneurysm.    PT Comments    The pt was seen for continued mobility progression and training on stair navigation due to anticipated d/c home later today. The pt required BUE support and assist to manage 2 steps due to poor dynamic stability and strength in LE to complete step-ups. After performance, discussed importance of having additional person with him to complete all stairs for safety as the pt reports he does not have a rail at his home. The pt will benefit from max HHPT to improve safety and mobility in the home, would benefit from additional support from family/friends as often as available to improve safety with mobility and self-care tasks in the home.     Recommendations for follow up therapy are one component of a multi-disciplinary discharge planning process, led by the attending physician.  Recommendations may be updated based on patient status, additional functional criteria and insurance authorization.  Follow Up Recommendations  Home health PT (pt opting for d/c home due to appointment on 12/13. will need max HHPT)     Assistance Recommended at Discharge Intermittent Supervision/Assistance  Equipment Recommendations  Rolling walker (2 wheels)    Recommendations for Other Services       Precautions / Restrictions Precautions Precautions: Fall Required Braces or Orthoses: Other Brace Other Brace: RLE cam walking boot Restrictions Weight Bearing Restrictions: Yes LLE  Weight Bearing: Partial weight bearing LLE Partial Weight Bearing Percentage or Pounds: 50% Other Position/Activity Restrictions: Per pt report, unsure last time he has followed up with Ortho.     Mobility  Bed Mobility Overal bed mobility: Modified Independent             General bed mobility comments: pt completing without assist other than to bring LLE into bed    Transfers Overall transfer level: Needs assistance Equipment used: Rolling walker (2 wheels) Transfers: Sit to/from Stand Sit to Stand: Supervision           General transfer comment: supervision with use of RW    Ambulation/Gait Ambulation/Gait assistance: Min guard;Supervision Gait Distance (Feet): 10 Feet Assistive device: Rolling walker (2 wheels) Gait Pattern/deviations: Decreased stride length;Decreased weight shift to left;Knee flexed in stance - left;Step-to pattern Gait velocity: decreased Gait velocity interpretation: <1.31 ft/sec, indicative of household ambulator   General Gait Details: pt needing UE support to manage stability, limited to short distance ambulation to focus on stair navigation   Stairs Stairs: Yes Stairs assistance: Min assist Stair Management: One rail Right;Step to pattern;Forwards Number of Stairs: 2 General stair comments: step to with LLE leading and BUE support       Balance Overall balance assessment: Needs assistance Sitting-balance support: No upper extremity supported;Feet supported Sitting balance-Leahy Scale: Good     Standing balance support: Single extremity supported;During functional activity Standing balance-Leahy Scale: Fair Standing balance comment: BUE support for static stance                            Cognition Arousal/Alertness: Awake/alert Behavior During Therapy: Flat affect Overall  Cognitive Status: No family/caregiver present to determine baseline cognitive functioning Area of Impairment: Safety/judgement                      Memory: Decreased short-term memory   Safety/Judgement: Decreased awareness of safety     General Comments: pt with flat affect, generally waiting on cues/istructions from therapist. able to verbalize assist needed or technique for stair management.        Exercises      General Comments General comments (skin integrity, edema, etc.): VSS, pt dressing to return home      Pertinent Vitals/Pain Pain Assessment: Faces Pain Score: 6  Faces Pain Scale: Hurts even more Pain Location: neck Pain Descriptors / Indicators: Aching;Discomfort;Grimacing;Tightness Pain Intervention(s): Limited activity within patient's tolerance;Monitored during session;Repositioned     PT Goals (current goals can now be found in the care plan section) Acute Rehab PT Goals Patient Stated Goal: to get more rehab before returning home PT Goal Formulation: With patient Time For Goal Achievement: 04/06/21 Potential to Achieve Goals: Good Progress towards PT goals: Progressing toward goals    Frequency    Min 3X/week      PT Plan Current plan remains appropriate       AM-PAC PT "6 Clicks" Mobility   Outcome Measure  Help needed turning from your back to your side while in a flat bed without using bedrails?: None Help needed moving from lying on your back to sitting on the side of a flat bed without using bedrails?: None Help needed moving to and from a bed to a chair (including a wheelchair)?: A Little Help needed standing up from a chair using your arms (e.g., wheelchair or bedside chair)?: A Little Help needed to walk in hospital room?: A Little Help needed climbing 3-5 steps with a railing? : A Lot 6 Click Score: 19    End of Session Equipment Utilized During Treatment: Gait belt Activity Tolerance: Patient tolerated treatment well Patient left: in bed;with call bell/phone within reach Nurse Communication: Mobility status PT Visit Diagnosis: Unsteadiness on feet  (R26.81);Other abnormalities of gait and mobility (R26.89);Muscle weakness (generalized) (M62.81);History of falling (Z91.81)     Time: 1655-3748 PT Time Calculation (min) (ACUTE ONLY): 28 min  Charges:  $Gait Training: 8-22 mins $Therapeutic Activity: 8-22 mins                     West Carbo, PT, DPT   Acute Rehabilitation Department Pager #: [phone]    Sandra Cockayne 03/29/2021, 1:56 PM

## 2021-03-29 NOTE — Progress Notes (Signed)
Occupational Therapy Treatment Patient Details Name: Austin Andrade MRN: [id number] DOB: [birth year] Today's Date: 03/29/2021   History of present illness Pt is a 70 y.o. male admitted on 03/21/21 with atrial fibrilation with RVR and diarrhea. CXR negative. CT showed possbile colitis or diverticulitis as well as cholelithiasis. PMH includes: ischemic stroke (residual hemianopsia and balance deficits), L tibial fx s/p external fixation 10/30/20, chronic neck pain, paroxysmal a-fib, HLD, HTN, mitral regugitation, CHF, aneurysm.   OT comments  This 70 yo male admitted with above seen today to see how he did with bed mobility, sit<>stand, and ADLs. He is overall at a setup/S level for basic ADLs but lives alone and also needs to be able to IADLs so SNF D/C is still recommended. If that does not pan out then [facility] and [facility] would be next best option. We will continue to follow.    Recommendations for follow up therapy are one component of a multi-disciplinary discharge planning process, led by the attending physician.  Recommendations may be updated based on patient status, additional functional criteria and insurance authorization.    Follow Up Recommendations  Skilled nursing-short term rehab (<3 hours/day) (would be best, but if home then [facility] and [place])    Assistance Recommended at Discharge Intermittent Supervision/Assistance  Equipment Recommendations  BSC/3in1       Precautions / Restrictions Precautions Precautions: Fall Required Braces or Orthoses: Other Brace Other Brace: RLE cam walking boot Restrictions Weight Bearing Restrictions: Yes LLE Weight Bearing: Partial weight bearing LLE Partial Weight Bearing Percentage or Pounds: 50% Other Position/Activity Restrictions: Per pt report, unsure last time he has followed up with Ortho.       Mobility Bed Mobility Overal bed mobility: Modified Independent                  Transfers Overall transfer level: Needs  assistance Equipment used: Rolling walker (2 wheels) Transfers: Sit to/from Stand Sit to Stand: Supervision                 Balance Overall balance assessment: Needs assistance Sitting-balance support: No upper extremity supported;Feet supported Sitting balance-Leahy Scale: Good     Standing balance support: Single extremity supported;During functional activity Standing balance-Leahy Scale: Fair                             ADL either performed or assessed with clinical judgement   ADL Overall ADL's : Needs assistance/impaired     Grooming: Sitting;Set up   Upper Body Bathing: Set up;Sitting   Lower Body Bathing: Set up;Supervison/ safety;Sit to/from stand   Upper Body Dressing : Set up;Sitting   Lower Body Dressing: Set up;Supervision/safety;Sit to/from stand   Toilet Transfer: Supervision/safety;Ambulation;Rollator (4 wheels)                  Extremity/Trunk Assessment Upper Extremity Assessment Upper Extremity Assessment: Overall WFL for tasks assessed            Vision Baseline Vision/History: 1 Wears glasses Ability to See in Adequate Light: 0 Adequate Patient Visual Report: No change from baseline            Cognition Arousal/Alertness: Awake/alert Behavior During Therapy: Flat affect Overall Cognitive Status: No family/caregiver present to determine baseline cognitive functioning                                 General Comments: Pt  reports he has trouble managing when and what to take of his medications at home (made MD aware in chat text)                     Pertinent Vitals/ Pain       Pain Assessment: 0-10 Pain Score: 6  Pain Location: neck Pain Descriptors / Indicators: Aching;Discomfort;Grimacing;Tightness Pain Intervention(s): Limited activity within patient's tolerance;Monitored during session;Patient requesting pain meds-RN notified;Repositioned         Frequency  Min 2X/week         Progress Toward Goals  OT Goals(current goals can now be found in the care plan section)  Progress towards OT goals: Progressing toward goals  Acute Rehab OT Goals Patient Stated Goal: to go to SNF then home but really want to make my oral surgeon appointment tomorrow OT Goal Formulation: With patient Time For Goal Achievement: 04/07/21 Potential to Achieve Goals: Good  Plan Discharge plan remains appropriate       AM-PAC OT "6 Clicks" Daily Activity     Outcome Measure   Help from another person eating meals?: None Help from another person taking care of personal grooming?: A Little Help from another person toileting, which includes using toliet, bedpan, or urinal?: A Little Help from another person bathing (including washing, rinsing, drying)?: A Little Help from another person to put on and taking off regular upper body clothing?: A Little Help from another person to put on and taking off regular lower body clothing?: A Little 6 Click Score: 19    End of Session Equipment Utilized During Treatment: Rolling walker (2 wheels);Other (comment) (cam walking boot)  OT Visit Diagnosis: Unsteadiness on feet (R26.81);Muscle weakness (generalized) (M62.81);Other abnormalities of gait and mobility (R26.89)   Activity Tolerance Patient tolerated treatment well   Patient Left in bed;with call bell/phone within reach;with bed alarm set   Nurse Communication Mobility status        Time: 1021-1173 OT Time Calculation (min): 30 min  Charges: OT General Charges $OT Visit: 1 Visit OT Treatments $Self Care/Home Management : 23-37 mins  [place] Circle, OTR/L Acute [employer] Pager [phone] Office [phone]    Almon Register 03/29/2021, 12:01 PM

## 2021-03-29 NOTE — TOC Progression Note (Signed)
Transition of Care [facility]) - Progression Note    Patient Details  Name: Austin Andrade MRN: [id number] Date of Birth: [birth year]  Transition of [facility]) CM/SW Contact  Zenon Mayo, RN Phone Number: 03/29/2021, 12:20 PM  Clinical Narrative:    Patient worked with physical therapy and click score is 19, he walked 290 feet,  NCM spoke with patient to see if he would want to go home with [facility] services. He states he would, he states he would need transportation as well and would need ast in getting up the three steps at his back door.  NCM informed him will ast with transportation.     Expected Discharge Plan: [place] Barriers to Discharge: Continued Medical Work up  Expected Discharge Plan and Services Expected Discharge Plan: [place]   Discharge Planning Services: CM Consult   Living arrangements for the past 2 months: Apartment                   DME Agency: NA       HH Arranged: NA           Social Determinants of Health (SDOH) Interventions    Readmission Risk Interventions No flowsheet data found.

## 2021-03-29 NOTE — Discharge Summary (Signed)
Physician Discharge Summary  Austin Andrade OYD:[id number] DOB: 07-13-1950 DOA: 03/21/2021  PCP: Sandi Mariscal, MD  Admit date: 03/21/2021 Discharge date: 03/29/2021 Admitted From: Home Disposition: Home Recommendations for Outpatient Follow-up:  Follow ups as below. Please obtain CBC/BMP/Mag at follow up Recommend outpatient follow-up with gastroenterology for colonoscopy Please follow up on the following pending results: None Home Health: PT/OT/RN/aide/SW Equipment/Devices: Patient has appropriate DME. Discharge Condition: Stable CODE STATUS: DNR/DNI  Follow-up Information     Sandi Mariscal, MD. Schedule an appointment as soon as possible for a visit in 1 week(s).   Specialty: Internal Medicine Contact information: [place] [address] [phone]                Hospital Course: 70 year old F with PMH of PAF, HTN, HLD, moderate MVR and BPH presenting with diarrhea and admitted for A. fib with RVR and possible diverticulitis/colitis.  CT scan suggestive for diverticulitis versus colitis.  Patient was started on Cardizem drip and IV Zosyn.     Patient's RVR resolved and he was transitioned to p.o. meds.  He was on IV Zosyn for sepsis due to possible diverticulitis.  However, patient has not had further bowel movements.   Initially, therapy recommended SNF.  However, patient chose to go home stating he has a friend to pick him up and help at home.  Accordingly, he is discharged home with home health, and discharged with home health PT/OT/RN/aide/SW. he has appropriate DME.  See individual problem list below for more on hospital course.  Discharge Diagnoses:  Paroxysmal A. fib with RVR: RVR resolved.  CHA2DS2-VASc score 5. -Continue metoprolol and Xarelto   Sepsis due to acute diverticulitis?  Patient presents with diarrhea for 3 days.  CT shows bowel wall thickening of the sigmoid colon concerning for diverticulitis versus colitis.  Has not a bowel movement  here.  Blood cultures NGTD.  Sepsis physiology resolved. -12/4 Zosyn>> 12/7 Unasyn> 12/10 Augmentin>> 10/13 -Needs colonoscopy once he recovers from diverticulitis   Elevated troponin: Felt to be demand ischemia from RVR and sepsis.  Patient had no chest pain.  EKG without acute ischemic finding. -Cardiology recommended medical management -Continue Toprol, statin and Xarelto.   Dehydration: Likely due to the above.  Resolved.   Essential Hypertension: Normotensive for most part. -Continue Toprol XL. -Discontinued amlodipine   Hyperlipidemia: documented h/o such.  -Continue Crestor   BPH without LUTS -Continue on Flomax    Chronic neck pain/depression-seems to have bright affect today. -Continue home oxycodone and baclofen. -Continue home gabapentin at reduced dose -Bowel regimen-MiraLAX and Senokot-S   History of systolic CHF with recovered EF: Appears euvolemic. -Most recent TTE basically normal except for mildly elevated RVSP.   History of right PCA and left cerebellar CVA in 2013 Generalized weakness/physical deconditioning: Likely due to the above. Recent left lower extremity fracture -HH PT/OT/RN/aide/CSW ordered.   Constipation: Resolved. -Bowel regimen as above. Body mass index is 21.76 kg/m.           Discharge Exam: Vitals:   03/28/21 1924 03/28/21 2239 03/29/21 0534 03/29/21 0810  BP: 123/70 134/76  130/72  Pulse:   87 70  Temp: 98 F (36.7 C) 98 F (36.7 C) 97.8 F (36.6 C) 98.4 F (36.9 C)  Resp: 14 17 17 18   Height:      Weight:   64.9 kg   SpO2: 97% 97% 97% 95%  TempSrc: Oral Oral Oral Oral  BMI (Calculated):   21.76      GENERAL:  Frail looking elderly male.  No apparent distress. HEENT: MMM.  Vision and hearing grossly intact.  NECK: Supple.  No apparent JVD.  RESP: 95% on RA.  No IWOB.  Fair aeration bilaterally. CVS:  RRR. Heart sounds normal.  ABD/GI/GU: Bowel sounds present. Soft. Non tender.  MSK/EXT:  Moves extremities. No  apparent deformity. No edema.  SKIN: no apparent skin lesion or wound NEURO: Awake and alert.  Oriented appropriately.  No apparent focal neuro deficit. PSYCH: Calm. Normal affect.   Discharge Instructions  Discharge Instructions     Amb referral to [facility]   Complete by: As directed    Call MD for:  difficulty breathing, headache or visual disturbances   Complete by: As directed    Call MD for:  extreme fatigue   Complete by: As directed    Call MD for:  persistant dizziness or light-headedness   Complete by: As directed    Call MD for:  persistant nausea and vomiting   Complete by: As directed    Diet general   Complete by: As directed    Discharge instructions   Complete by: As directed    It has been a pleasure taking care of you!  You were hospitalized with atrial fibrillation (rapid and irregular heartbeat), and diarrhea due to diverticulosis (colon infection).  You have been treated medically with her medication and antibiotics for infection.  Your symptoms improved.  We are discharging you more antibiotics to complete treatment course.  Please review your new medication list and the directions on your medications before you take them.  Follow-up with your primary care doctor in 1 to 2 weeks or sooner if needed.  We also recommend follow-up with gastroenterology in 2 to 3 weeks for colonoscopy.  You PCP can give you a referral to gastroenterology if you have not had one.   Take care,   Increase activity slowly   Complete by: As directed       Allergies as of 03/29/2021   No Known Allergies      Medication List     STOP taking these medications    amLODipine 5 MG tablet Commonly known as: NORVASC   apixaban 5 MG Tabs tablet Commonly known as: ELIQUIS   GOODYS BODY PAIN PO   traMADol 50 MG tablet Commonly known as: ULTRAM       TAKE these medications    amoxicillin-clavulanate 875-125 MG tablet Commonly known as: AUGMENTIN Take 1 tablet by mouth  every 12 (twelve) hours.   Baclofen 5 MG Tabs Take 5 mg by mouth every 8 (eight) hours as needed (neck and back muscle pain).   DULoxetine 30 MG capsule Commonly known as: CYMBALTA Take 30 mg by mouth daily.   gabapentin 600 MG tablet Commonly known as: NEURONTIN Take 1 tablet (600 mg total) by mouth 3 (three) times daily. What changed: how much to take   metoprolol tartrate 50 MG tablet Commonly known as: LOPRESSOR Take 1 tablet (50 mg total) by mouth 2 (two) times daily.   nicotine 21 mg/24hr patch Commonly known as: NICODERM CQ - dosed in mg/24 hours Place 1 patch (21 mg total) onto the skin daily. Start taking on: March 30, 2021   oxyCODONE 15 MG immediate release tablet Commonly known as: ROXICODONE Take 15 mg by mouth every 12 (twelve) hours as needed (moderate to severe pain).   rivaroxaban 20 MG Tabs tablet Commonly known as: XARELTO Take 20 mg by mouth daily.   rosuvastatin 5  MG tablet Commonly known as: CRESTOR Take 1 tablet (5 mg total) by mouth daily with supper.   senna-docusate 8.6-50 MG tablet Commonly known as: Senokot-S Take 1 tablet by mouth 2 (two) times daily as needed for moderate constipation.   tamsulosin 0.4 MG Caps capsule Commonly known as: FLOMAX Take 1 capsule (0.4 mg total) by mouth daily. What changed: when to take this        Consultations: Cardiology  Procedures/Studies: None   CT HEAD WO CONTRAST (5MM)  Result Date: 03/21/2021 CLINICAL DATA:  Mental status change.  Unknown cause EXAM: CT HEAD WITHOUT CONTRAST TECHNIQUE: Contiguous axial images were obtained from the base of the skull through the vertex without intravenous contrast. COMPARISON:  MR head 10/31/2020 BRAIN: BRAIN Cerebral ventricle sizes are concordant with the degree of cerebral volume loss. Right occipital lobe and right cerebellar encephalomalacia. No evidence of large-territorial acute infarction. No parenchymal hemorrhage. No mass lesion. No extra-axial  collection. No mass effect or midline shift. No hydrocephalus. Basilar cisterns are patent. Vascular: No hyperdense vessel. Atherosclerotic calcifications are present within the cavernous internal carotid and vertebral arteries. Skull: No acute fracture or focal lesion. Sinuses/Orbits: Paranasal sinuses and mastoid air cells are clear. The orbits are unremarkable. Other: None. IMPRESSION: No acute intracranial abnormality. Electronically Signed   By: Iven Finn M.D.   On: 03/21/2021 20:06   CT ABDOMEN PELVIS W CONTRAST  Result Date: 03/21/2021 CLINICAL DATA:  Abdominal pain. EXAM: CT ABDOMEN AND PELVIS WITH CONTRAST TECHNIQUE: Multidetector CT imaging of the abdomen and pelvis was performed using the standard protocol following bolus administration of intravenous contrast. CONTRAST:  185mL OMNIPAQUE IOHEXOL 300 MG/ML  SOLN COMPARISON:  May 24, 2019 FINDINGS: Lower chest: Partially visualized emphysematous changes. Hepatobiliary: No focal liver abnormality is seen. Cholelithiasis without CT evidence of cholecystitis. Pancreas: Atrophic appearance of the pancreas. Two coarse calcifications in the head of the pancreas, near the ampulla. No significant pancreatic duct dilation. Spleen: Normal in size without focal abnormality. Adrenals/Urinary Tract: Adrenal glands are unremarkable. Kidneys are normal, without renal calculi, focal lesion, or hydronephrosis. Mild urinary bladder wall thickening. Layering punctate calcifications within the right hand side of the urinary bladder. Stomach/Bowel: Stomach is within normal limits. Appendix appears normal. No evidence of small bowel wall thickening, distention, or inflammatory changes. Scattered colonic diverticulosis. Long segment of circumferential mucosal thickening of the sigmoid colon may represent colitis or diverticulitis. Vascular/Lymphatic: No significant vascular findings are present. No enlarged abdominal or pelvic lymph nodes. Reproductive: Prostate is  unremarkable. Other: No abdominal wall hernia or abnormality. No abdominopelvic ascites. Musculoskeletal: Spondylosis of the lumbosacral spine. IMPRESSION: 1. Long segment of circumferential mucosal thickening of the sigmoid colon, which may represent colitis or diverticulitis. 2. Cholelithiasis without CT evidence of cholecystitis. 3. Two coarse calcifications in the head of the pancreas, near the ampulla. No significant pancreatic duct dilation. These findings may represent sequela of chronic pancreatitis. 4. Layering punctate calcifications within the right hand side of the urinary bladder. Query recent ureteral calculi passage. 5. Partially visualized emphysematous changes in the lung bases. Emphysema (ICD10-J43.9). Electronically Signed   By: Fidela Salisbury M.D.   On: 03/21/2021 20:06   DG Chest Port 1 View  Result Date: 03/21/2021 CLINICAL DATA:  AFib.  Dizziness. EXAM: PORTABLE CHEST 1 VIEW COMPARISON:  November 07, 2020 FINDINGS: Calcific atherosclerotic disease and tortuosity of the aorta. Cardiomediastinal silhouette is normal. Mediastinal contours appear intact. There is no evidence of focal airspace consolidation, pleural effusion or pneumothorax. Chronic interstitial lung  changes. Osseous structures are without acute abnormality. Lower cervical spine fusion. Soft tissues are grossly normal. IMPRESSION: 1. No active disease. 2. Chronic interstitial lung changes. Electronically Signed   By: Fidela Salisbury M.D.   On: 03/21/2021 19:22       The results of significant diagnostics from this hospitalization (including imaging, microbiology, ancillary and laboratory) are listed below for reference.     Microbiology: Recent Results (from the past 240 hour(s))  Culture, blood (routine x 2)     Status: None   Collection Time: 03/21/21  8:50 PM   Specimen: BLOOD LEFT ARM  Result Value Ref Range Status   Specimen Description BLOOD LEFT ARM  Final   Special Requests   Final    BOTTLES DRAWN  AEROBIC AND ANAEROBIC Blood Culture results may not be optimal due to an inadequate volume of blood received in culture bottles   Culture   Final    NO GROWTH 5 DAYS Performed at [facility], [place] [address] [place], [place] [address]    Report Status 03/26/2021 FINAL  Final  Culture, blood (routine x 2)     Status: None   Collection Time: 03/21/21  8:53 PM   Specimen: BLOOD RIGHT ARM  Result Value Ref Range Status   Specimen Description BLOOD RIGHT ARM  Final   Special Requests   Final    BOTTLES DRAWN AEROBIC AND ANAEROBIC Blood Culture results may not be optimal due to an inadequate volume of blood received in culture bottles   Culture   Final    NO GROWTH 5 DAYS Performed at [facility], [place] [address] [place], [place] [address]    Report Status 03/26/2021 FINAL  Final  Resp Panel by RT-PCR (Flu A&B, Covid) Nasopharyngeal Swab     Status: None   Collection Time: 03/21/21  8:59 PM   Specimen: Nasopharyngeal Swab; Nasopharyngeal(NP) swabs in vial transport medium  Result Value Ref Range Status   SARS Coronavirus 2 by RT PCR NEGATIVE NEGATIVE Final    Comment: (NOTE) SARS-CoV-2 target nucleic acids are NOT DETECTED.  The SARS-CoV-2 RNA is generally detectable in upper respiratory specimens during the acute phase of infection. The lowest concentration of SARS-CoV-2 viral copies this assay can detect is 138 copies/mL. A negative result does not preclude SARS-Cov-2 infection and should not be used as the sole basis for treatment or other patient management decisions. A negative result may occur with  improper specimen collection/handling, submission of specimen other than nasopharyngeal swab, presence of viral mutation(s) within the areas targeted by this assay, and inadequate number of viral copies(<138 copies/mL). A negative result must be combined with clinical observations, patient history, and epidemiological information. The expected result is Negative.  Fact  Sheet for Patients:  EntrepreneurPulse.com.au  Fact Sheet for Healthcare Providers:  IncredibleEmployment.be  This test is no t yet approved or cleared by the [place] FDA and  has been authorized for detection and/or diagnosis of SARS-CoV-2 by FDA under an Emergency Use Authorization (EUA). This EUA will remain  in effect (meaning this test can be used) for the duration of the COVID-19 declaration under Section 564(b)(1) of the Act, 21 U.S.C.section 360bbb-3(b)(1), unless the authorization is terminated  or revoked sooner.       Influenza A by PCR NEGATIVE NEGATIVE Final   Influenza B by PCR NEGATIVE NEGATIVE Final    Comment: (NOTE) The Xpert Xpress SARS-CoV-2/FLU/RSV plus assay is intended as an aid in the diagnosis of influenza  from Nasopharyngeal swab specimens and should not be used as a sole basis for treatment. Nasal washings and aspirates are unacceptable for Xpert Xpress SARS-CoV-2/FLU/RSV testing.  Fact Sheet for Patients: EntrepreneurPulse.com.au  Fact Sheet for Healthcare Providers: IncredibleEmployment.be  This test is not yet approved or cleared by the [place] FDA and has been authorized for detection and/or diagnosis of SARS-CoV-2 by FDA under an Emergency Use Authorization (EUA). This EUA will remain in effect (meaning this test can be used) for the duration of the COVID-19 declaration under Section 564(b)(1) of the Act, 21 U.S.C. section 360bbb-3(b)(1), unless the authorization is terminated or revoked.  Performed at [facility], [place] [address], [place], [place] [address]      Labs:  CBC: Recent Labs  Lab 03/23/21 0119 03/24/21 0425 03/25/21 0310  WBC 12.2* 10.6* 10.7*  HGB 12.2* 12.4* 12.4*  HCT 37.4* 37.1* 37.9*  MCV 93.3 92.1 93.8  PLT 253 228 208   BMP &GFR Recent Labs  Lab 03/23/21 0119 03/24/21 0425 03/25/21 0310  NA 135 136 136  K 4.3 4.3  4.1  CL 106 105 106  CO2 21* 22 23  GLUCOSE 119* 147* 176*  BUN 17 25* 22  CREATININE 0.88 0.93 0.92  CALCIUM 9.0 8.8* 8.8*  MG  --   --  2.3  PHOS  --   --  3.1   Estimated Creatinine Clearance: 67.6 mL/min (by C-G formula based on SCr of 0.92 mg/dL). Liver & Pancreas: Recent Labs  Lab 03/25/21 0310  ALBUMIN 2.8*   No results for input(s): LIPASE, AMYLASE in the last 168 hours. No results for input(s): AMMONIA in the last 168 hours. Diabetic: No results for input(s): HGBA1C in the last 72 hours. No results for input(s): GLUCAP in the last 168 hours. Cardiac Enzymes: No results for input(s): CKTOTAL, CKMB, CKMBINDEX, TROPONINI in the last 168 hours. No results for input(s): PROBNP in the last 8760 hours. Coagulation Profile: No results for input(s): INR, PROTIME in the last 168 hours. Thyroid Function Tests: No results for input(s): TSH, T4TOTAL, FREET4, T3FREE, THYROIDAB in the last 72 hours. Lipid Profile: No results for input(s): CHOL, HDL, LDLCALC, TRIG, CHOLHDL, LDLDIRECT in the last 72 hours. Anemia Panel: No results for input(s): VITAMINB12, FOLATE, FERRITIN, TIBC, IRON, RETICCTPCT in the last 72 hours. Urine analysis:    Component Value Date/Time   COLORURINE AMBER (A) 03/21/2021 2010   APPEARANCEUR CLOUDY (A) 03/21/2021 2010   LABSPEC 1.015 03/21/2021 2010   PHURINE 5.5 03/21/2021 2010   GLUCOSEU NEGATIVE 03/21/2021 2010   HGBUR NEGATIVE 03/21/2021 2010   [place] NEGATIVE 03/21/2021 2010   [place] NEGATIVE 03/21/2021 2010   PROTEINUR NEGATIVE 03/21/2021 2010   UROBILINOGEN 1.0 09/01/2013 1753   NITRITE POSITIVE (A) 03/21/2021 2010   LEUKOCYTESUR NEGATIVE 03/21/2021 2010   Sepsis Labs: Invalid input(s): PROCALCITONIN, LACTICIDVEN   Time coordinating discharge: 45 minutes  SIGNED:  Mercy Riding, MD  [facility] 03/29/2021, 12:41 PM

## 2021-04-01 ENCOUNTER — Emergency Department (HOSPITAL_COMMUNITY): Payer: Medicare Other

## 2021-04-01 ENCOUNTER — Encounter (HOSPITAL_COMMUNITY): Payer: Self-pay

## 2021-04-01 ENCOUNTER — Inpatient Hospital Stay (HOSPITAL_COMMUNITY)
Admission: EM | Admit: 2021-04-01 | Discharge: 2021-04-18 | DRG: 309 | Disposition: E | Payer: Medicare Other | Attending: Internal Medicine | Admitting: Internal Medicine

## 2021-04-01 ENCOUNTER — Other Ambulatory Visit: Payer: Self-pay

## 2021-04-01 DIAGNOSIS — Z66 Do not resuscitate: Secondary | ICD-10-CM | POA: Diagnosis present

## 2021-04-01 DIAGNOSIS — I69398 Other sequelae of cerebral infarction: Secondary | ICD-10-CM

## 2021-04-01 DIAGNOSIS — Z7901 Long term (current) use of anticoagulants: Secondary | ICD-10-CM

## 2021-04-01 DIAGNOSIS — I959 Hypotension, unspecified: Secondary | ICD-10-CM | POA: Diagnosis present

## 2021-04-01 DIAGNOSIS — Z833 Family history of diabetes mellitus: Secondary | ICD-10-CM

## 2021-04-01 DIAGNOSIS — I442 Atrioventricular block, complete: Secondary | ICD-10-CM | POA: Diagnosis not present

## 2021-04-01 DIAGNOSIS — Z9114 Patient's other noncompliance with medication regimen: Secondary | ICD-10-CM

## 2021-04-01 DIAGNOSIS — J9811 Atelectasis: Secondary | ICD-10-CM | POA: Diagnosis present

## 2021-04-01 DIAGNOSIS — W19XXXD Unspecified fall, subsequent encounter: Secondary | ICD-10-CM | POA: Diagnosis present

## 2021-04-01 DIAGNOSIS — R778 Other specified abnormalities of plasma proteins: Secondary | ICD-10-CM | POA: Diagnosis present

## 2021-04-01 DIAGNOSIS — Z85828 Personal history of other malignant neoplasm of skin: Secondary | ICD-10-CM | POA: Diagnosis not present

## 2021-04-01 DIAGNOSIS — I480 Paroxysmal atrial fibrillation: Secondary | ICD-10-CM | POA: Diagnosis present

## 2021-04-01 DIAGNOSIS — R531 Weakness: Secondary | ICD-10-CM | POA: Diagnosis not present

## 2021-04-01 DIAGNOSIS — S82402D Unspecified fracture of shaft of left fibula, subsequent encounter for closed fracture with routine healing: Secondary | ICD-10-CM

## 2021-04-01 DIAGNOSIS — E785 Hyperlipidemia, unspecified: Secondary | ICD-10-CM | POA: Diagnosis present

## 2021-04-01 DIAGNOSIS — G8929 Other chronic pain: Secondary | ICD-10-CM | POA: Diagnosis present

## 2021-04-01 DIAGNOSIS — I4891 Unspecified atrial fibrillation: Secondary | ICD-10-CM | POA: Diagnosis not present

## 2021-04-01 DIAGNOSIS — S82202D Unspecified fracture of shaft of left tibia, subsequent encounter for closed fracture with routine healing: Secondary | ICD-10-CM

## 2021-04-01 DIAGNOSIS — Z79899 Other long term (current) drug therapy: Secondary | ICD-10-CM

## 2021-04-01 DIAGNOSIS — N401 Enlarged prostate with lower urinary tract symptoms: Secondary | ICD-10-CM | POA: Diagnosis present

## 2021-04-01 DIAGNOSIS — I248 Other forms of acute ischemic heart disease: Secondary | ICD-10-CM | POA: Diagnosis present

## 2021-04-01 DIAGNOSIS — Z20822 Contact with and (suspected) exposure to covid-19: Secondary | ICD-10-CM | POA: Diagnosis present

## 2021-04-01 DIAGNOSIS — R6510 Systemic inflammatory response syndrome (SIRS) of non-infectious origin without acute organ dysfunction: Secondary | ICD-10-CM

## 2021-04-01 DIAGNOSIS — N179 Acute kidney failure, unspecified: Secondary | ICD-10-CM | POA: Diagnosis not present

## 2021-04-01 DIAGNOSIS — R338 Other retention of urine: Secondary | ICD-10-CM | POA: Diagnosis present

## 2021-04-01 DIAGNOSIS — I080 Rheumatic disorders of both mitral and aortic valves: Secondary | ICD-10-CM | POA: Diagnosis present

## 2021-04-01 DIAGNOSIS — I10 Essential (primary) hypertension: Secondary | ICD-10-CM | POA: Diagnosis not present

## 2021-04-01 DIAGNOSIS — N400 Enlarged prostate without lower urinary tract symptoms: Secondary | ICD-10-CM | POA: Diagnosis present

## 2021-04-01 DIAGNOSIS — M109 Gout, unspecified: Secondary | ICD-10-CM | POA: Diagnosis present

## 2021-04-01 DIAGNOSIS — E8721 Acute metabolic acidosis: Secondary | ICD-10-CM | POA: Diagnosis present

## 2021-04-01 DIAGNOSIS — I110 Hypertensive heart disease with heart failure: Secondary | ICD-10-CM | POA: Diagnosis present

## 2021-04-01 DIAGNOSIS — Z87891 Personal history of nicotine dependence: Secondary | ICD-10-CM

## 2021-04-01 DIAGNOSIS — K219 Gastro-esophageal reflux disease without esophagitis: Secondary | ICD-10-CM | POA: Diagnosis present

## 2021-04-01 DIAGNOSIS — E119 Type 2 diabetes mellitus without complications: Secondary | ICD-10-CM | POA: Diagnosis present

## 2021-04-01 DIAGNOSIS — I5022 Chronic systolic (congestive) heart failure: Secondary | ICD-10-CM | POA: Diagnosis present

## 2021-04-01 DIAGNOSIS — E860 Dehydration: Secondary | ICD-10-CM | POA: Diagnosis present

## 2021-04-01 DIAGNOSIS — I495 Sick sinus syndrome: Secondary | ICD-10-CM | POA: Diagnosis not present

## 2021-04-01 DIAGNOSIS — D72829 Elevated white blood cell count, unspecified: Secondary | ICD-10-CM | POA: Diagnosis present

## 2021-04-01 DIAGNOSIS — R7989 Other specified abnormal findings of blood chemistry: Secondary | ICD-10-CM

## 2021-04-01 LAB — CBC WITH DIFFERENTIAL/PLATELET
Abs Immature Granulocytes: 0.09 10*3/uL — ABNORMAL HIGH (ref 0.00–0.07)
Basophils Absolute: 0 10*3/uL (ref 0.0–0.1)
Basophils Relative: 0 %
Eosinophils Absolute: 0 10*3/uL (ref 0.0–0.5)
Eosinophils Relative: 0 %
HCT: 42.3 % (ref 39.0–52.0)
Hemoglobin: 13.7 g/dL (ref 13.0–17.0)
Immature Granulocytes: 1 %
Lymphocytes Relative: 10 %
Lymphs Abs: 1.6 10*3/uL (ref 0.7–4.0)
MCH: 30.9 pg (ref 26.0–34.0)
MCHC: 32.4 g/dL (ref 30.0–36.0)
MCV: 95.3 fL (ref 80.0–100.0)
Monocytes Absolute: 0.7 10*3/uL (ref 0.1–1.0)
Monocytes Relative: 4 %
Neutro Abs: 13.5 10*3/uL — ABNORMAL HIGH (ref 1.7–7.7)
Neutrophils Relative %: 85 %
Platelets: 230 10*3/uL (ref 150–400)
RBC: 4.44 MIL/uL (ref 4.22–5.81)
RDW: 16.7 % — ABNORMAL HIGH (ref 11.5–15.5)
WBC: 15.8 10*3/uL — ABNORMAL HIGH (ref 4.0–10.5)
nRBC: 0 % (ref 0.0–0.2)

## 2021-04-01 LAB — COMPREHENSIVE METABOLIC PANEL
ALT: 18 U/L (ref 0–44)
AST: 43 U/L — ABNORMAL HIGH (ref 15–41)
Albumin: 3.6 g/dL (ref 3.5–5.0)
Alkaline Phosphatase: 101 U/L (ref 38–126)
Anion gap: 13 (ref 5–15)
BUN: 19 mg/dL (ref 8–23)
CO2: 23 mmol/L (ref 22–32)
Calcium: 9.2 mg/dL (ref 8.9–10.3)
Chloride: 104 mmol/L (ref 98–111)
Creatinine, Ser: 0.98 mg/dL (ref 0.61–1.24)
GFR, Estimated: >60 mL/min (ref >60)
Glucose, Bld: 173 mg/dL — ABNORMAL HIGH (ref 70–99)
Potassium: 3.9 mmol/L (ref 3.5–5.1)
Sodium: 140 mmol/L (ref 135–145)
Total Bilirubin: 1.1 mg/dL (ref 0.3–1.2)
Total Protein: 7.3 g/dL (ref 6.5–8.1)

## 2021-04-01 LAB — LIPASE, BLOOD: Lipase: 21 U/L (ref 11–51)

## 2021-04-01 LAB — LACTIC ACID, PLASMA
Lactic Acid, Venous: 2.8 mmol/L (ref 0.5–1.9)
Lactic Acid, Venous: 1.4 mmol/L (ref 0.5–1.9)
Lactic Acid, Venous: 2.4 mmol/L (ref 0.5–1.9)

## 2021-04-01 LAB — TROPONIN I (HIGH SENSITIVITY)
Troponin I (High Sensitivity): 3166 ng/L (ref <18)
Troponin I (High Sensitivity): 3260 ng/L (ref <18)

## 2021-04-01 LAB — BRAIN NATRIURETIC PEPTIDE: B Natriuretic Peptide: 1205 pg/mL — ABNORMAL HIGH (ref 0.0–100.0)

## 2021-04-01 LAB — PROTIME-INR
INR: 2.2 — ABNORMAL HIGH (ref 0.8–1.2)
Prothrombin Time: 24.7 seconds — ABNORMAL HIGH (ref 11.4–15.2)

## 2021-04-01 LAB — RESP PANEL BY RT-PCR (FLU A&B, COVID) ARPGX2
Influenza A by PCR: NEGATIVE
Influenza B by PCR: NEGATIVE
SARS Coronavirus 2 by RT PCR: NEGATIVE

## 2021-04-01 LAB — APTT: aPTT: 39 seconds — ABNORMAL HIGH (ref 24–36)

## 2021-04-01 LAB — ETHANOL: Alcohol, Ethyl (B): <10 mg/dL (ref <10)

## 2021-04-01 MED ORDER — SODIUM CHLORIDE 0.9 % IV BOLUS
500.0000 mL | Freq: Once | INTRAVENOUS | Status: AC
  Administered 2021-04-01: 500 mL via INTRAVENOUS

## 2021-04-01 MED ORDER — VANCOMYCIN HCL IN DEXTROSE 1-5 GM/200ML-% IV SOLN
1000.0000 mg | Freq: Once | INTRAVENOUS | Status: DC
  Filled 2021-04-01: qty 200

## 2021-04-01 MED ORDER — SODIUM CHLORIDE 0.9 % IV SOLN
2.0000 g | Freq: Once | INTRAVENOUS | Status: AC
  Administered 2021-04-01: 2 g via INTRAVENOUS
  Filled 2021-04-01: qty 2

## 2021-04-01 MED ORDER — DILTIAZEM HCL-DEXTROSE 125-5 MG/125ML-% IV SOLN (PREMIX)
5.0000 mg/h | INTRAVENOUS | Status: DC
  Administered 2021-04-01: 5 mg/h via INTRAVENOUS
  Filled 2021-04-01: qty 125

## 2021-04-01 MED ORDER — AMIODARONE HCL 200 MG PO TABS
200.0000 mg | ORAL_TABLET | Freq: Every day | ORAL | Status: DC
  Administered 2021-04-01 – 2021-04-02 (×2): 200 mg via ORAL
  Filled 2021-04-01 (×2): qty 1

## 2021-04-01 MED ORDER — SODIUM CHLORIDE 0.9 % IV SOLN: INTRAVENOUS | Status: DC

## 2021-04-01 MED ORDER — SENNOSIDES-DOCUSATE SODIUM 8.6-50 MG PO TABS: 1.0000 | ORAL_TABLET | Freq: Two times a day (BID) | ORAL | Status: DC | PRN

## 2021-04-01 MED ORDER — METOPROLOL TARTRATE 25 MG PO TABS
25.0000 mg | ORAL_TABLET | Freq: Once | ORAL | Status: AC
  Administered 2021-04-01: 25 mg via ORAL
  Filled 2021-04-01: qty 1

## 2021-04-01 MED ORDER — OXYCODONE HCL 5 MG PO TABS
15.0000 mg | ORAL_TABLET | Freq: Two times a day (BID) | ORAL | Status: DC | PRN
  Administered 2021-04-01 – 2021-04-02 (×2): 15 mg via ORAL
  Filled 2021-04-01 (×2): qty 3

## 2021-04-01 MED ORDER — VANCOMYCIN HCL 1250 MG/250ML IV SOLN
1250.0000 mg | Freq: Once | INTRAVENOUS | Status: AC
  Administered 2021-04-01: 1250 mg via INTRAVENOUS
  Filled 2021-04-01: qty 250

## 2021-04-01 MED ORDER — IOHEXOL 350 MG/ML SOLN
80.0000 mL | Freq: Once | INTRAVENOUS | Status: AC | PRN
  Administered 2021-04-01: 80 mL via INTRAVENOUS

## 2021-04-01 MED ORDER — TAMSULOSIN HCL 0.4 MG PO CAPS
0.4000 mg | ORAL_CAPSULE | Freq: Every evening | ORAL | Status: DC
  Administered 2021-04-01 – 2021-04-02 (×2): 0.4 mg via ORAL
  Filled 2021-04-01 (×2): qty 1

## 2021-04-01 MED ORDER — SODIUM CHLORIDE 0.9 % IV BOLUS
250.0000 mL | Freq: Once | INTRAVENOUS | Status: AC
  Administered 2021-04-01: 250 mL via INTRAVENOUS

## 2021-04-01 MED ORDER — METOPROLOL TARTRATE 50 MG PO TABS
50.0000 mg | ORAL_TABLET | Freq: Two times a day (BID) | ORAL | Status: DC
  Administered 2021-04-02: 50 mg via ORAL
  Filled 2021-04-01 (×2): qty 1

## 2021-04-01 MED ORDER — METOPROLOL TARTRATE 50 MG PO TABS
50.0000 mg | ORAL_TABLET | Freq: Two times a day (BID) | ORAL | Status: DC
  Administered 2021-04-01: 50 mg via ORAL
  Filled 2021-04-01: qty 2

## 2021-04-01 MED ORDER — AMIODARONE LOAD VIA INFUSION: 150.0000 mg | Freq: Once | INTRAVENOUS | Status: DC

## 2021-04-01 MED ORDER — DULOXETINE HCL 30 MG PO CPEP
30.0000 mg | ORAL_CAPSULE | Freq: Every day | ORAL | Status: DC
  Administered 2021-04-01 – 2021-04-02 (×2): 30 mg via ORAL
  Filled 2021-04-01 (×3): qty 1

## 2021-04-01 MED ORDER — ROSUVASTATIN CALCIUM 5 MG PO TABS
5.0000 mg | ORAL_TABLET | Freq: Every day | ORAL | Status: DC
  Administered 2021-04-01 – 2021-04-02 (×2): 5 mg via ORAL
  Filled 2021-04-01 (×2): qty 1

## 2021-04-01 MED ORDER — BACLOFEN 5 MG HALF TABLET: 5.0000 mg | ORAL_TABLET | Freq: Three times a day (TID) | ORAL | Status: DC | PRN

## 2021-04-01 MED ORDER — SODIUM CHLORIDE 0.9 % IV BOLUS: 500.0000 mL | Freq: Once | INTRAVENOUS | Status: AC

## 2021-04-01 MED ORDER — RIVAROXABAN 20 MG PO TABS
20.0000 mg | ORAL_TABLET | Freq: Every day | ORAL | Status: DC
  Administered 2021-04-01 – 2021-04-02 (×2): 20 mg via ORAL
  Filled 2021-04-01: qty 2
  Filled 2021-04-01: qty 1

## 2021-04-01 MED ORDER — GABAPENTIN 600 MG PO TABS
600.0000 mg | ORAL_TABLET | Freq: Three times a day (TID) | ORAL | Status: DC
  Administered 2021-04-01 – 2021-04-02 (×5): 600 mg via ORAL
  Filled 2021-04-01 (×6): qty 1

## 2021-04-01 MED ORDER — METRONIDAZOLE 500 MG/100ML IV SOLN
500.0000 mg | Freq: Once | INTRAVENOUS | Status: AC
  Administered 2021-04-01: 500 mg via INTRAVENOUS
  Filled 2021-04-01: qty 100

## 2021-04-01 NOTE — ED Notes (Signed)
Pt reports no recent falls but feeling like he was going to fall a lot lately, yesterday he felt dizziness with standing and like he was going to fall, pt also has a broken left leg that is in a boot

## 2021-04-01 NOTE — Progress Notes (Addendum)
° °      CROSS COVER NOTE  NAME: Austin Andrade MRN: [id number] DOB : [birth year]   Subjetive:  Page received from ED RN regarding hypotension. BP 83/62. On arrival to the floor at 2109 patient BP 93/73 in NSR and asymptomatic per 6E RN report.   Objective:  Vitals with BMI 04/15/2021 03/30/2021 04/06/2021  Height 5\' 8"  - -  Weight 144 lbs 3 oz - -  BMI 44.01 - -  Systolic - 93 83  Diastolic - 73 62  Pulse - 81 71   Vitals with BMI 04/02/2021 04/08/2021 04/16/2021  Height - - 5\' 8"   Weight - - 144 lbs 3 oz  BMI - - 02.72  Systolic 93 92 -  Diastolic 69 68 -  Pulse 85 87 -     Assessment:  General: Adult male, lying in bed in no acute distress HEENT: MM pink/moist,  atraumatic, neck supple Neuro: A&O x 4, follows commands CV: s1s2, regular rate and rhythm NSR on monitor, no r/g (+) murmur Pulm: Regular, non labored  breath sounds clear-BUL & clear-BLL GI: soft, non-distended, non-tender, (+) bs x 4 GU: per patient report has not voided today Skin: Dry skin on bilateral upper arms, no rashes/lesions noted, no tenting Extremities: warm/dry, pulses + 2 R/P, no edema noted   Plan:  Urinary Retention        - Bladder scan --> 699 mL       - I/O cath not required, patient voided spontaneously @ ~2300       - Continue ordered Flomax  Hypotension       - 500 mL bolus       - 25 mg metoprolol given for 2200 dose, reordered 50mg  BID to start at 1000  04/02/21

## 2021-04-01 NOTE — ED Notes (Signed)
Cardiologist aware of pt BP lowered cardizem

## 2021-04-01 NOTE — Sepsis Progress Note (Signed)
eLink monitoring code sepsis.  
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
## 2021-04-01 NOTE — ED Triage Notes (Signed)
Denies feeling palpitations at this time

## 2021-04-01 NOTE — ED Notes (Signed)
MD paged about pt BP.
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
## 2021-04-01 NOTE — ED Triage Notes (Signed)
Pt BIB EMS from home after he was watching TV this AM  and felt heart palpitations, he has hx of same and was given metoprolol for rate control, it has not been affective, EMS arrived pt was in Afibb RVR with rate at 170-200, he was given 20mg  of Cardizem with no changes, and 300 NS, pt denies CP, SHOB, Dizziness, and denies feeling palpitations at this AM

## 2021-04-02 LAB — PROCALCITONIN: Procalcitonin: 0.12 ng/mL

## 2021-04-02 LAB — BASIC METABOLIC PANEL
Anion gap: 5 (ref 5–15)
BUN: 30 mg/dL — ABNORMAL HIGH (ref 8–23)
CO2: 21 mmol/L — ABNORMAL LOW (ref 22–32)
Calcium: 8.5 mg/dL — ABNORMAL LOW (ref 8.9–10.3)
Chloride: 109 mmol/L (ref 98–111)
Creatinine, Ser: 1.36 mg/dL — ABNORMAL HIGH (ref 0.61–1.24)
GFR, Estimated: 56 mL/min — ABNORMAL LOW (ref >60)
Glucose, Bld: 145 mg/dL — ABNORMAL HIGH (ref 70–99)
Potassium: 5.1 mmol/L (ref 3.5–5.1)
Sodium: 135 mmol/L (ref 135–145)

## 2021-04-02 LAB — CBC WITH DIFFERENTIAL/PLATELET
Abs Immature Granulocytes: 0.06 10*3/uL (ref 0.00–0.07)
Basophils Absolute: 0 10*3/uL (ref 0.0–0.1)
Basophils Relative: 0 %
Eosinophils Absolute: 0.1 10*3/uL (ref 0.0–0.5)
Eosinophils Relative: 0 %
HCT: 35.4 % — ABNORMAL LOW (ref 39.0–52.0)
Hemoglobin: 11.5 g/dL — ABNORMAL LOW (ref 13.0–17.0)
Immature Granulocytes: 1 %
Lymphocytes Relative: 13 %
Lymphs Abs: 1.8 10*3/uL (ref 0.7–4.0)
MCH: 31.2 pg (ref 26.0–34.0)
MCHC: 32.5 g/dL (ref 30.0–36.0)
MCV: 95.9 fL (ref 80.0–100.0)
Monocytes Absolute: 0.9 10*3/uL (ref 0.1–1.0)
Monocytes Relative: 7 %
Neutro Abs: 10.5 10*3/uL — ABNORMAL HIGH (ref 1.7–7.7)
Neutrophils Relative %: 79 %
Platelets: 213 10*3/uL (ref 150–400)
RBC: 3.69 MIL/uL — ABNORMAL LOW (ref 4.22–5.81)
RDW: 16.9 % — ABNORMAL HIGH (ref 11.5–15.5)
WBC: 13.3 10*3/uL — ABNORMAL HIGH (ref 4.0–10.5)
nRBC: 0 % (ref 0.0–0.2)

## 2021-04-02 LAB — LACTIC ACID, PLASMA: Lactic Acid, Venous: 1.6 mmol/L (ref 0.5–1.9)

## 2021-04-02 LAB — URINALYSIS, ROUTINE W REFLEX MICROSCOPIC
Bacteria, UA: NONE SEEN
Bilirubin Urine: NEGATIVE
Glucose, UA: NEGATIVE mg/dL
Hgb urine dipstick: NEGATIVE
Ketones, ur: NEGATIVE mg/dL
Leukocytes,Ua: NEGATIVE
Nitrite: NEGATIVE
Protein, ur: NEGATIVE mg/dL
Specific Gravity, Urine: 1.02 (ref 1.005–1.030)
pH: 6 (ref 5.0–8.0)

## 2021-04-02 LAB — BLOOD CULTURE ID PANEL (REFLEXED) - BCID2

## 2021-04-02 LAB — APTT: aPTT: 41 seconds — ABNORMAL HIGH (ref 24–36)

## 2021-04-02 LAB — PROTIME-INR
INR: 3 — ABNORMAL HIGH (ref 0.8–1.2)
Prothrombin Time: 31.1 seconds — ABNORMAL HIGH (ref 11.4–15.2)

## 2021-04-02 LAB — URINE CULTURE

## 2021-04-02 LAB — C-REACTIVE PROTEIN: CRP: 0.9 mg/dL (ref <1.0)

## 2021-04-02 MED ORDER — ORAL CARE MOUTH RINSE
15.0000 mL | Freq: Two times a day (BID) | OROMUCOSAL | Status: DC
  Administered 2021-04-02: 15 mL via OROMUCOSAL

## 2021-04-02 MED ORDER — SODIUM CHLORIDE 0.9 % IV SOLN: INTRAVENOUS | Status: DC

## 2021-04-02 MED ORDER — MORPHINE SULFATE (PF) 2 MG/ML IV SOLN
2.0000 mg | INTRAVENOUS | Status: DC | PRN
  Administered 2021-04-02: 2 mg via INTRAVENOUS
  Filled 2021-04-02 (×2): qty 1

## 2021-04-02 MED ORDER — ONDANSETRON HCL 4 MG/2ML IJ SOLN
4.0000 mg | Freq: Four times a day (QID) | INTRAMUSCULAR | Status: DC | PRN
  Administered 2021-04-02: 4 mg via INTRAVENOUS
  Filled 2021-04-02: qty 2

## 2021-04-02 NOTE — Progress Notes (Signed)
Pt.refused In  & out cath.He said he will try to void first.

## 2021-04-02 NOTE — TOC Initial Note (Addendum)
Transition of Care [facility]) - Initial/Assessment Note    Patient Details  Name: Austin Andrade MRN: [id number] Date of Birth: Oct 16, 1950  Transition of [facility]) CM/SW Contact:    Milas Gain, [place] Phone Number: 04/02/2021, 1:09 PM  Clinical Narrative:               CSW started insurance authorization for patient reference # [id number].     CSW received consult for possible SNF placement at time of discharge. CSW spoke with patient at bedside. Patient reports he comes from home with roommate who is currently in the hospital. Patient expressed understanding of PT recommendation and is agreeable to SNF placement at time of discharge. Patient gave CSW permission to fax out initial referral near the [place] area. CSW discussed insurance authorization process with patient. Patient reports he has received the COVID vaccines as well as 1 booster. Patient expressed being hopeful for rehab and to feel better soon. CSW to continue to follow and assist with discharge planning needs.      Patient Goals and CMS Choice        Expected Discharge Plan and Services                                                Prior Living Arrangements/Services                       Activities of Daily Living Home Assistive Devices/Equipment: Cane (specify quad or straight), Walker (specify type) ADL Screening (condition at time of admission) Patient's cognitive ability adequate to safely complete daily activities?: Yes Is the patient deaf or have difficulty hearing?: No Does the patient have difficulty seeing, even when wearing glasses/contacts?: No Does the patient have difficulty concentrating, remembering, or making decisions?: No Patient able to express need for assistance with ADLs?: Yes Does the patient have difficulty dressing or bathing?: No Independently performs ADLs?: Yes (appropriate for developmental age) Does the patient have difficulty walking or climbing stairs?:  Yes Weakness of Legs: Left Weakness of Arms/Hands: None  Permission Sought/Granted                  Emotional Assessment              Admission diagnosis:  Elevated troponin [R77.8] Atrial fibrillation with RVR ([place]) [I48.91] Patient Active Problem List   Diagnosis Date Noted   BPH (benign prostatic hyperplasia) 04/09/2021   Atrial fibrillation with RVR ([place]) 03/21/2021   Elevated troponin 03/21/2021   Leukocytosis 03/21/2021   Diarrhea 03/21/2021   Generalized weakness 03/21/2021   Dehydration 03/21/2021   Congestive heart failure ([place]) 11/10/2020   Encephalopathy acute 10/30/2020   Tibia/fibula fracture, left, closed, initial encounter 10/30/2020   Subdural hematoma 10/30/2020   Rhabdomyolysis 10/30/2020   Sepsis ([place]) 05/23/2019   ARF (acute renal failure) ([place]) 05/23/2019   Weight loss 02/04/2014   Cerebral infarction ([place]) 05/16/2012   Cardiomyopathy ([place]) 02/03/2012   Preventative health care 01/26/2012   Artery occlusion, 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   CHF (congestive heart failure) ([place]) 01/19/2012   Acute ischemic multifocal posterior circulation stroke ([place]) 01/18/2012   Hyperlipidemia with target LDL less than 100 01/18/2012   Stenosis, cervical spine 01/18/2012   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

## 2021-04-02 NOTE — [place] FL2 (Signed)
[place] MEDICAID FL2 LEVEL OF CARE SCREENING TOOL     IDENTIFICATION  Patient Name: Austin Andrade Birthdate: Nov 12, 1950 Sex: male Admission Date (Current Location): 03/21/2021  [facility] and [place] Number:  Herbalist and Address:  [facility], [place] [address] [place], [place] [address]      Provider Number: [id number]  Attending Physician Name and Address:  Shelly Coss, MD  Relative Name and Phone Number:       Current Level of Care: Hospital Recommended Level of Care: [place] Prior Approval Number:    Date Approved/Denied:   PASRR Number: [id number]  Discharge Plan: SNF    Current Diagnoses: Patient Active Problem List   Diagnosis Date Noted   BPH (benign prostatic hyperplasia) 04/05/2021   Atrial fibrillation with RVR ([place]) 03/21/2021   Elevated troponin 03/21/2021   Leukocytosis 03/21/2021   Diarrhea 03/21/2021   Generalized weakness 03/21/2021   Dehydration 03/21/2021   Congestive heart failure ([place]) 11/10/2020   Encephalopathy acute 10/30/2020   Tibia/fibula fracture, left, closed, initial encounter 10/30/2020   Subdural hematoma 10/30/2020   Rhabdomyolysis 10/30/2020   Sepsis ([place]) 05/23/2019   ARF (acute renal failure) ([place]) 05/23/2019   Weight loss 02/04/2014   Cerebral infarction ([place]) 05/16/2012   Cardiomyopathy ([place]) 02/03/2012   Preventative health care 01/26/2012   Artery occlusion, 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   CHF (congestive heart failure) ([place]) 01/19/2012   Acute ischemic multifocal posterior circulation stroke ([place]) 01/18/2012   Hyperlipidemia with target LDL less than 100 01/18/2012   Stenosis, cervical spine 01/18/2012   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   Hypertension 01/17/2012    Orientation RESPIRATION BLADDER Height & Weight     Self, Time, Situation, Place  O2 (Nasal Cannula 1 liter) Continent Weight: 145 lb 4.5 oz (65.9 kg) Height:  5\' 8"  (172.7 cm)   BEHAVIORAL SYMPTOMS/MOOD NEUROLOGICAL BOWEL NUTRITION STATUS      Continent Diet (Please see discharge summary)  AMBULATORY STATUS COMMUNICATION OF NEEDS Skin   Supervision Verbally Other (Comment) (appropriate for ethnicity,dry,ecchymosis,arm,right)                       Personal Care Assistance Level of Assistance  Bathing, Feeding, Dressing Bathing Assistance: Limited assistance Feeding assistance: Independent (able to feed self) Dressing Assistance: Limited assistance     Functional Limitations Info    Sight Info: Impaired Hearing Info: Adequate Speech Info: Adequate    SPECIAL CARE FACTORS FREQUENCY  PT (By licensed PT), OT (By licensed OT)     PT Frequency: 5x min weekly OT Frequency: 5x min weekly            Contractures Contractures Info: Not present    Additional Factors Info  Code Status Code Status Info: DNR Allergies Info: No Known Allergies           Current Medications (04/02/2021):  This is the current hospital active medication list Current Facility-Administered Medications  Medication Dose Route Frequency Provider Last Rate Last Admin   0.9 %  sodium chloride infusion   Intravenous Continuous Adhikari, Amrit, MD 100 mL/hr at 04/02/21 1020 Restarted at 04/02/21 1020   amiodarone (PACERONE) tablet 200 mg  200 mg Oral Daily Fuller Plan A, MD   200 mg at 04/02/21 0859   baclofen (LIORESAL) tablet 5 mg  5 mg Oral Q8H PRN Fuller Plan A, MD       diltiazem (CARDIZEM) 125 mg in dextrose 5% 125 mL (  1 mg/mL) infusion  5-15 mg/hr Intravenous Continuous Fredia Sorrow, MD   Stopped at 04/05/2021 1300   gabapentin (NEURONTIN) tablet 600 mg  600 mg Oral TID Fuller Plan A, MD   600 mg at 04/02/21 0858   MEDLINE mouth rinse  15 mL Mouth Rinse BID Tamala Julian, Rondell A, MD       metoprolol tartrate (LOPRESSOR) tablet 50 mg  50 mg Oral BID Foust, Katy L, NP       morphine 2 MG/ML injection 2 mg  2 mg Intravenous Q3H PRN Shelly Coss, MD   2 mg at  04/02/21 1017   oxyCODONE (Oxy IR/ROXICODONE) immediate release tablet 15 mg  15 mg Oral Q12H PRN Fuller Plan A, MD   15 mg at 04/02/21 0857   rivaroxaban (XARELTO) tablet 20 mg  20 mg Oral Daily Smith, Rondell A, MD   20 mg at 04/02/21 0859   rosuvastatin (CRESTOR) tablet 5 mg  5 mg Oral Q supper Fuller Plan A, MD   5 mg at 03/22/2021 1706   senna-docusate (Senokot-S) tablet 1 tablet  1 tablet Oral BID PRN Fuller Plan A, MD       tamsulosin (FLOMAX) capsule 0.4 mg  0.4 mg Oral QPM Tamala Julian, Rondell A, MD   0.4 mg at 03/28/2021 1706     Discharge Medications: Please see discharge summary for a list of discharge medications.  Relevant Imaging Results:  Relevant Lab Results:   Additional Information [phone], Both Covid Vaccines and 1 Booster  Milas Gain, LCSWA

## 2021-04-02 NOTE — Progress Notes (Signed)
Primary Cardiologist:  Marlou Porch  Subjective:  Denies SSCP, palpitations or Dyspnea   Objective:  Vitals:   04/08/2021 2339 04/02/21 0100 04/02/21 0601 04/02/21 0736  BP: 92/68 93/69 96/70  93/72  Pulse: 87 85 81 89  Resp:  18 18 18   Temp:  (!) 97.5 F (36.4 C) (!) 97.5 F (36.4 C)   TempSrc:  Oral Oral   SpO2: 99% 99% 98% 97%  Weight:   65.9 kg   Height:        Intake/Output from previous day:  Intake/Output Summary (Last 24 hours) at 04/02/2021 0857 Last data filed at 04/02/2021 0705 Gross per 24 hour  Intake 2341.44 ml  Output 550 ml  Net 1791.44 ml    Physical Exam: Disheveled white male  Lungs clear Apical MR murmur  No edema LLE in boot   Lab Results: Basic Metabolic Panel: Recent Labs    03/29/2021 0755 04/02/21 0108  NA 140 135  K 3.9 5.1  CL 104 109  CO2 23 21*  GLUCOSE 173* 145*  BUN 19 30*  CREATININE 0.98 1.36*  CALCIUM 9.2 8.5*   Liver Function Tests: Recent Labs    04/15/2021 0755  AST 43*  ALT 18  ALKPHOS 101  BILITOT 1.1  PROT 7.3  ALBUMIN 3.6   Recent Labs    03/31/2021 0755  LIPASE 21   CBC: Recent Labs    04/11/2021 0755 04/02/21 0108  WBC 15.8* 13.3*  NEUTROABS 13.5* 10.5*  HGB 13.7 11.5*  HCT 42.3 35.4*  MCV 95.3 95.9  PLT 230 213     Imaging: CT Angio Chest PE W/Cm &/Or Wo Cm  Result Date: 04/17/2021 CLINICAL DATA:  Pulmonary embolism suspected.  Tachycardia EXAM: CT ANGIOGRAPHY CHEST WITH CONTRAST TECHNIQUE: Multidetector CT imaging of the chest was performed using the standard protocol during bolus administration of intravenous contrast. Multiplanar CT image reconstructions and MIPs were obtained to evaluate the vascular anatomy. CONTRAST:  77mL OMNIPAQUE IOHEXOL 350 MG/ML SOLN COMPARISON:  CT examination dated November 08, 2020. FINDINGS: Cardiovascular: Satisfactory opacification of the pulmonary arteries to the segmental level. No evidence of pulmonary embolism. Heart is mildly enlarged. No pericardial effusion. Mild  coronary artery atherosclerotic calcifications. Main pulmonary trunk is mildly dilated measuring up to 3.3 cm. There is also reflux of contrast into the hepatic veins concerning for pulmonary arterial hypertension/right heart failure. Mediastinum/Nodes: No enlarged mediastinal, hilar, or axillary lymph nodes. Thyroid gland, trachea, and esophagus demonstrate no significant findings. Lungs/Pleura: Centrilobular emphysematous changes prominent in the bilateral upper lobes. Small bilateral pleural effusions. Bibasilar dependent atelectasis. No evidence of pneumonia or pulmonary edema. Upper Abdomen: No acute abnormality. Musculoskeletal: No chest wall abnormality. No acute or significant osseous findings. Review of the MIP images confirms the above findings. IMPRESSION: 1.  No evidence of pulmonary embolism. 2.  Moderate emphysematous changes.  No evidence of pneumonia. 3.  Small bilateral pleural effusions. 4. Dilated main pulmonary trunk measuring up to 3.3 cm and reflux into of contrast into the hepatic veins concerning for pulmonary artery hypertension/right heart failure. Electronically Signed   By: Keane Police D.O.   On: 04/09/2021 12:22   CT Abdomen Pelvis W Contrast  Result Date: 03/18/2021 CLINICAL DATA:  Sepsis. EXAM: CT ABDOMEN AND PELVIS WITH CONTRAST TECHNIQUE: Multidetector CT imaging of the abdomen and pelvis was performed using the standard protocol following bolus administration of intravenous contrast. CONTRAST:  70mL OMNIPAQUE IOHEXOL 350 MG/ML SOLN COMPARISON:  March 21, 2021. FINDINGS: Lower chest: Minimal bilateral pleural effusions  are noted with adjacent subsegmental atelectasis. Hepatobiliary: Minimal cholelithiasis is noted. No biliary dilatation is noted. Hepatic steatosis is noted. Pancreas: Stable focal calcifications are seen in pancreatic head. No acute pancreatic inflammation is noted. Spleen: Normal in size without focal abnormality. Adrenals/Urinary Tract: Adrenal glands and  kidneys appear normal. No hydronephrosis or renal obstruction is noted. No renal or ureteral calculi are noted. Mild urinary bladder distention is noted with several small calculi seen in its dependent portion. Stomach/Bowel: Stomach is within normal limits. Appendix appears normal. No evidence of bowel wall thickening, distention, or inflammatory changes. Vascular/Lymphatic: Aortic atherosclerosis. No enlarged abdominal or pelvic lymph nodes. Reproductive: Prostate is unremarkable. Other: No abdominal wall hernia or abnormality. No abdominopelvic ascites. Musculoskeletal: No acute or significant osseous findings. IMPRESSION: Minimal bilateral pleural effusions with adjacent subsegmental atelectasis. Hepatic steatosis. Minimal cholelithiasis. Mild urinary bladder distention is noted. Several small bladder calculi are noted. Aortic Atherosclerosis (ICD10-I70.0). Electronically Signed   By: Marijo Conception M.D.   On: 04/17/2021 11:21   DG Chest Port 1 View  Result Date: 03/19/2021 CLINICAL DATA:  Palpitations. EXAM: PORTABLE CHEST 1 VIEW COMPARISON:  03/21/2021 FINDINGS: Interstitial thickening in the upper lungs appears to represent chronic changes. No large areas of airspace disease or lung consolidation. Heart and mediastinum are within normal limits and stable. Surgical plate in the lower cervical spine. Negative for a pneumothorax. IMPRESSION: Chronic lung changes without acute findings. Electronically Signed   By: Markus Daft M.D.   On: 03/19/2021 08:31    Cardiac Studies:  ECG: afib rate 153 nonspecific ST changes    Telemetry:  NSR   TEE 11/06/2020 IMPRESSIONS   1. Left ventricular ejection fraction, by estimation, is 60 to 65%. The  left ventricle has normal function. There is mild left ventricular  hypertrophy.   2. Right ventricular systolic function is normal. The right ventricular  size is normal.   3. Left atrial size was mildly dilated. No left atrial/left atrial  appendage thrombus  was detected.   4. The mitral valve is degenerative. Moderate mitral valve regurgitation.   5. The aortic valve is tricuspid. Aortic valve regurgitation is mild.  Mild aortic valve sclerosis is present, with no evidence of aortic valve  stenosis.   6. Pulmonic valve regurgitation trivial to mild.   7. Agitated saline contrast bubble study was negative, with no evidence  of any interatrial shunt.  Medications:    amiodarone  200 mg Oral Daily   DULoxetine  30 mg Oral Daily   gabapentin  600 mg Oral TID   mouth rinse  15 mL Mouth Rinse BID   metoprolol tartrate  50 mg Oral BID   rivaroxaban  20 mg Oral Daily   rosuvastatin  5 mg Oral Q supper   tamsulosin  0.4 mg Oral QPM      diltiazem (CARDIZEM) infusion Stopped (03/23/2021 1300)    Assessment/Plan:    Atrial fibrillation with rapid ventricular rate -converted to NSR continue amiodarone and back on lopressor and xarelto ? Compliance issues at home with ETOH issues    2.  Elevated troponin -Hs Troponin 3166 (last admission it was 895>>1080) -Could be demand ischemia in setting of acute issue -Certainly he could have underlying CAD given risk factor -no chest pain or acute ST changes on ECG  - outpatient risk stratification ? Myovue    3. diverticulitis/colitis -Noted on last admission.  Pending study this admission.  Lactic acid elevated.  Started on broad-spectrum antibiotic by ED provider.  But not continued as inpatient   Jenkins Rouge 04/02/2021, 8:57 AM

## 2021-04-02 NOTE — Progress Notes (Signed)
PROGRESS NOTE    Austin Andrade  DDU:[id number] DOB: 02-03-1951 DOA: 04/06/2021 PCP: Sandi Mariscal, MD   Chief Complain: Palpitation  Brief Narrative: Patient is a 70 year old male with history of paroxysmal A. fib on anticoagulation, hypertension, hyperlipidemia, mitral regurgitation, BPH who presented here with complaints of palpitations.  He was just recently hospitalized from 12/4-12/12 when he was managed for A. fib with RVR, possible diverticulitis/colitis.  On discharge he was transitioned to metoprolol and Xarelto for anticoagulation.  There was report of not taking medications at home.  EMS found that he was in A. fib with RVR and was given 20 mg of IV diltiazem.  On presentation he was in A. fib with RVR, hypotensive.  Lab work showed elevated WBC, lactic acid, BNP, elevated troponin.  Chest x-ray showed chronic lung findings without active issues.  Cardiology consulted and following.  Assessment & Plan:   Principal Problem:   Atrial fibrillation with RVR (HCC) Active Problems:   Hypertension   Hyperlipidemia with target LDL less than 100   Elevated troponin   Leukocytosis   Generalized weakness   BPH (benign prostatic hyperplasia)   A. fib with RVR: Recurrent problem.  Hypotensive on presentation. Started on Cardizem drip, started on amiodarone.  Cardiology following here.  Metoprolol continued.HR stable this mrng and converted to NSR  Leukocytosis/SIRS: Elevated lactate level, WBC: Presentation.  CT abdomen/pelvis did not show any sign of inflammation or acute finding.  Cultures have not  any growth.  Abx d/ced.  UA without signs of UTI  Elevated troponin: Acute on chronic.  Likely from A. fib with RVR.  Cardiology recommended outpatient Myoview  Lactic acidosis: Initial lactate is elevated at 2.8.  Likely from hypoperfusion from rapid A. fib, hypotension.  Resolved One of the blood culture on anaerobic bottles for gram-positive cocci consistent  with staph epidermidis: Most  likely  contamination  History of recent diverticulitis: Noted on prior hospitalization.  CT abdomen/pelvis done during this hospitalization did not show any signs of infection.  BPH: On Flomax.  Retained urine during this hospitalization.  Continue to monitor urine output.  History of prior chronic systolic congestive heart failure:currently euvolemic.  Echo done on July 2020 showed EF of 60-65%,  Hyperlipidemia: On Crestor  AKI: Creatinine elevated today most likely due to hemodynamic changes from hypotension/A. fib with RVR.  Continue IV fluids.  Looks dehydrated  Left tibia/fibular fracture: Currently on boot.  Continue outpatient follow-up with orthopedics  Generalized weakness: Looks very deconditioned.  PT/OT consulted.  Recommendation SNF on discharge ,complains of chronic neck pain             DVT prophylaxis:Xarelto Code Status:  Family Communication: None at bedside Patient status:  Dispo: The patient is from: Home              Anticipated d/c is to: SnF              Anticipated d/c date is: In next 1-2 days  Consultants: Cardiology  Procedures:None  Antimicrobials:  Anti-infectives (From admission, onward)    Start     Dose/Rate Route Frequency Ordered Stop   04/06/2021 1045  vancomycin (VANCOREADY) IVPB 1250 mg/250 mL        1,250 mg 166.7 mL/hr over 90 Minutes Intravenous  Once 03/27/2021 1031 04/11/2021 1345   03/31/2021 1030  ceFEPIme (MAXIPIME) 2 g in sodium chloride 0.9 % 100 mL IVPB        2 g 200 mL/hr over 30 Minutes Intravenous  Once  03/18/2021 1025 04/12/2021 1222   04/15/2021 1030  metroNIDAZOLE (FLAGYL) IVPB 500 mg        500 mg 100 mL/hr over 60 Minutes Intravenous  Once 04/17/2021 1026 04/08/2021 1541   04/17/2021 1030  vancomycin (VANCOCIN) IVPB 1000 mg/200 mL premix  Status:  Discontinued        1,000 mg 200 mL/hr over 60 Minutes Intravenous  Once 04/06/2021 1026 04/14/2021 1031       Subjective: Patient seen and examined at the bedside this morning.   On normal sinus rhythm.  Very weak, deconditioned, lying in bed.  Complains of severe neck pain  Objective: Vitals:   04/14/2021 2339 04/02/21 0100 04/02/21 0601 04/02/21 0736  BP: 92/68 93/69 96/70  93/72  Pulse: 87 85 81 89  Resp:  18 18 18   Temp:  (!) 97.5 F (36.4 C) (!) 97.5 F (36.4 C)   TempSrc:  Oral Oral   SpO2: 99% 99% 98% 97%  Weight:   65.9 kg   Height:        Intake/Output Summary (Last 24 hours) at 04/02/2021 0813 Last data filed at 04/02/2021 0705 Gross per 24 hour  Intake 2341.44 ml  Output 550 ml  Net 1791.44 ml   Filed Weights   03/28/2021 0751 04/14/2021 2116 04/02/21 0601  Weight: 61.2 kg 65.4 kg 65.9 kg    Examination:  General exam: Chronically ill looking, weak,deconditioned HEENT: PERRL Respiratory system:  no wheezes or crackles  Cardiovascular system: S1 & S2 heard, RRR.  Gastrointestinal system: Abdomen is nondistended, soft and nontender. Central nervous system: Alert and oriented Extremities: No edema, no clubbing ,no cyanosis Skin: No rashes, no ulcers,no icterus      Data Reviewed: I have personally reviewed following labs and imaging studies  CBC: Recent Labs  Lab 03/20/2021 0755 04/02/21 0108  WBC 15.8* 13.3*  NEUTROABS 13.5* 10.5*  HGB 13.7 11.5*  HCT 42.3 35.4*  MCV 95.3 95.9  PLT 230 836   Basic Metabolic Panel: Recent Labs  Lab 04/02/2021 0755 04/02/21 0108  NA 140 135  K 3.9 5.1  CL 104 109  CO2 23 21*  GLUCOSE 173* 145*  BUN 19 30*  CREATININE 0.98 1.36*  CALCIUM 9.2 8.5*   GFR: Estimated Creatinine Clearance: 46.4 mL/min (A) (by C-G formula based on SCr of 1.36 mg/dL (H)). Liver Function Tests: Recent Labs  Lab 03/19/2021 0755  AST 43*  ALT 18  ALKPHOS 101  BILITOT 1.1  PROT 7.3  ALBUMIN 3.6   Recent Labs  Lab 03/21/2021 0755  LIPASE 21   No results for input(s): AMMONIA in the last 168 hours. Coagulation Profile: Recent Labs  Lab 03/22/2021 1117 04/02/21 0108  INR 2.2* 3.0*   Cardiac Enzymes: No  results for input(s): CKTOTAL, CKMB, CKMBINDEX, TROPONINI in the last 168 hours. BNP (last 3 results) No results for input(s): PROBNP in the last 8760 hours. HbA1C: No results for input(s): HGBA1C in the last 72 hours. CBG: No results for input(s): GLUCAP in the last 168 hours. Lipid Profile: No results for input(s): CHOL, HDL, LDLCALC, TRIG, CHOLHDL, LDLDIRECT in the last 72 hours. Thyroid Function Tests: No results for input(s): TSH, T4TOTAL, FREET4, T3FREE, THYROIDAB in the last 72 hours. Anemia Panel: No results for input(s): VITAMINB12, FOLATE, FERRITIN, TIBC, IRON, RETICCTPCT in the last 72 hours. Sepsis Labs: Recent Labs  Lab 04/04/2021 0755 03/24/2021 1230 03/28/2021 2116 04/02/21 0108  PROCALCITON  --   --   --  0.12  LATICACIDVEN 2.8* 1.4 2.4*  1.6    Recent Results (from the past 240 hour(s))  Culture, blood (Routine X 2) w Reflex to ID Panel     Status: None (Preliminary result)   Collection Time: 03/29/2021  9:00 AM   Specimen: BLOOD  Result Value Ref Range Status   Specimen Description BLOOD LEFT ANTECUBITAL  Final   Special Requests   Final    BOTTLES DRAWN AEROBIC AND ANAEROBIC Blood Culture adequate volume   Culture  Setup Time   Final    GRAM POSITIVE COCCI ANAEROBIC BOTTLE ONLY CRITICAL RESULT CALLED TO, READ BACK BY AND VERIFIED WITH: PHARMD GREG ABBOTT 04/02/21@6 :35 BY TW    Culture   Final    NO GROWTH < 24 HOURS Performed at [facility], [place] [address], [place], [place] [address]    Report Status PENDING  Incomplete  Resp Panel by RT-PCR (Flu A&B, Covid) Peripheral     Status: None   Collection Time: 03/18/2021  9:00 AM   Specimen: Peripheral; Nasopharyngeal(NP) swabs in vial transport medium  Result Value Ref Range Status   SARS Coronavirus 2 by RT PCR NEGATIVE NEGATIVE Final    Comment: (NOTE) SARS-CoV-2 target nucleic acids are NOT DETECTED.  The SARS-CoV-2 RNA is generally detectable in upper respiratory specimens during the acute phase of  infection. The lowest concentration of SARS-CoV-2 viral copies this assay can detect is 138 copies/mL. A negative result does not preclude SARS-Cov-2 infection and should not be used as the sole basis for treatment or other patient management decisions. A negative result may occur with  improper specimen collection/handling, submission of specimen other than nasopharyngeal swab, presence of viral mutation(s) within the areas targeted by this assay, and inadequate number of viral copies(<138 copies/mL). A negative result must be combined with clinical observations, patient history, and epidemiological information. The expected result is Negative.  Fact Sheet for Patients:  EntrepreneurPulse.com.au  Fact Sheet for Healthcare Providers:  IncredibleEmployment.be  This test is no t yet approved or cleared by the [place] FDA and  has been authorized for detection and/or diagnosis of SARS-CoV-2 by FDA under an Emergency Use Authorization (EUA). This EUA will remain  in effect (meaning this test can be used) for the duration of the COVID-19 declaration under Section 564(b)(1) of the Act, 21 U.S.C.section 360bbb-3(b)(1), unless the authorization is terminated  or revoked sooner.       Influenza A by PCR NEGATIVE NEGATIVE Final   Influenza B by PCR NEGATIVE NEGATIVE Final    Comment: (NOTE) The Xpert Xpress SARS-CoV-2/FLU/RSV plus assay is intended as an aid in the diagnosis of influenza from Nasopharyngeal swab specimens and should not be used as a sole basis for treatment. Nasal washings and aspirates are unacceptable for Xpert Xpress SARS-CoV-2/FLU/RSV testing.  Fact Sheet for Patients: EntrepreneurPulse.com.au  Fact Sheet for Healthcare Providers: IncredibleEmployment.be  This test is not yet approved or cleared by the [place] FDA and has been authorized for detection and/or diagnosis of SARS-CoV-2  by FDA under an Emergency Use Authorization (EUA). This EUA will remain in effect (meaning this test can be used) for the duration of the COVID-19 declaration under Section 564(b)(1) of the Act, 21 U.S.C. section 360bbb-3(b)(1), unless the authorization is terminated or revoked.  Performed at [facility], [place] [address] [place], [place] [address]   Blood Culture ID Panel (Reflexed)     Status: Abnormal   Collection Time: 03/29/2021  9:00 AM  Result Value Ref Range Status   Enterococcus  faecalis NOT DETECTED NOT DETECTED Final   Enterococcus Faecium NOT DETECTED NOT DETECTED Final   Listeria monocytogenes NOT DETECTED NOT DETECTED Final   Staphylococcus species DETECTED (A) NOT DETECTED Final    Comment: CRITICAL RESULT CALLED TO, READ BACK BY AND VERIFIED WITH: PHARMD GREG ABBOTT 04/02/21@6 :35 BY TW    Staphylococcus aureus (BCID) NOT DETECTED NOT DETECTED Final   Staphylococcus epidermidis DETECTED (A) NOT DETECTED Final    Comment: Methicillin (oxacillin) resistant coagulase negative staphylococcus. Possible blood culture contaminant (unless isolated from more than one blood culture draw or clinical case suggests pathogenicity). No antibiotic treatment is indicated for blood  culture contaminants. CRITICAL RESULT CALLED TO, READ BACK BY AND VERIFIED WITH: PHARMD GREG ABBOTT 04/02/21@6 :35 BY TW    Staphylococcus lugdunensis NOT DETECTED NOT DETECTED Final   Streptococcus species NOT DETECTED NOT DETECTED Final   Streptococcus agalactiae NOT DETECTED NOT DETECTED Final   Streptococcus pneumoniae NOT DETECTED NOT DETECTED Final   Streptococcus pyogenes NOT DETECTED NOT DETECTED Final   A.calcoaceticus-baumannii NOT DETECTED NOT DETECTED Final   Bacteroides fragilis NOT DETECTED NOT DETECTED Final   Enterobacterales NOT DETECTED NOT DETECTED Final   Enterobacter cloacae complex NOT DETECTED NOT DETECTED Final   Escherichia coli NOT DETECTED NOT DETECTED Final   Klebsiella  aerogenes NOT DETECTED NOT DETECTED Final   Klebsiella oxytoca NOT DETECTED NOT DETECTED Final   Klebsiella pneumoniae NOT DETECTED NOT DETECTED Final   Proteus species NOT DETECTED NOT DETECTED Final   Salmonella species NOT DETECTED NOT DETECTED Final   Serratia marcescens NOT DETECTED NOT DETECTED Final   Haemophilus influenzae NOT DETECTED NOT DETECTED Final   Neisseria meningitidis NOT DETECTED NOT DETECTED Final   Pseudomonas aeruginosa NOT DETECTED NOT DETECTED Final   Stenotrophomonas maltophilia NOT DETECTED NOT DETECTED Final   Candida albicans NOT DETECTED NOT DETECTED Final   Candida auris NOT DETECTED NOT DETECTED Final   Candida glabrata NOT DETECTED NOT DETECTED Final   Candida krusei NOT DETECTED NOT DETECTED Final   Candida parapsilosis NOT DETECTED NOT DETECTED Final   Candida tropicalis NOT DETECTED NOT DETECTED Final   Cryptococcus neoformans/gattii NOT DETECTED NOT DETECTED Final   Methicillin resistance mecA/C DETECTED (A) NOT DETECTED Final    Comment: CRITICAL RESULT CALLED TO, READ BACK BY AND VERIFIED WITH: PHARMD GREG ABBOTT 04/02/21@6 :35 BY TW Performed at [facility], [address] [place], [place] [address]   Culture, blood (Routine X 2) w Reflex to ID Panel     Status: None (Preliminary result)   Collection Time: 04/16/2021 11:17 AM   Specimen: Site Not Specified; Blood  Result Value Ref Range Status   Specimen Description SITE NOT SPECIFIED  Final   Special Requests   Final    BOTTLES DRAWN AEROBIC AND ANAEROBIC Blood Culture results may not be optimal due to an excessive volume of blood received in culture bottles   Culture   Final    NO GROWTH < 24 HOURS Performed at [facility], [address], [place], [place] [address]    Report Status PENDING  Incomplete         Radiology Studies: CT Angio Chest PE W/Cm &/Or Wo Cm  Result Date: 04/12/2021 CLINICAL DATA:  Pulmonary embolism suspected.  Tachycardia EXAM: CT ANGIOGRAPHY  CHEST WITH CONTRAST TECHNIQUE: Multidetector CT imaging of the chest was performed using the standard protocol during bolus administration of intravenous contrast. Multiplanar CT image reconstructions and MIPs were obtained to evaluate the vascular anatomy.  CONTRAST:  52mL OMNIPAQUE IOHEXOL 350 MG/ML SOLN COMPARISON:  CT examination dated November 08, 2020. FINDINGS: Cardiovascular: Satisfactory opacification of the pulmonary arteries to the segmental level. No evidence of pulmonary embolism. Heart is mildly enlarged. No pericardial effusion. Mild coronary artery atherosclerotic calcifications. Main pulmonary trunk is mildly dilated measuring up to 3.3 cm. There is also reflux of contrast into the hepatic veins concerning for pulmonary arterial hypertension/right heart failure. Mediastinum/Nodes: No enlarged mediastinal, hilar, or axillary lymph nodes. Thyroid gland, trachea, and esophagus demonstrate no significant findings. Lungs/Pleura: Centrilobular emphysematous changes prominent in the bilateral upper lobes. Small bilateral pleural effusions. Bibasilar dependent atelectasis. No evidence of pneumonia or pulmonary edema. Upper Abdomen: No acute abnormality. Musculoskeletal: No chest wall abnormality. No acute or significant osseous findings. Review of the MIP images confirms the above findings. IMPRESSION: 1.  No evidence of pulmonary embolism. 2.  Moderate emphysematous changes.  No evidence of pneumonia. 3.  Small bilateral pleural effusions. 4. Dilated main pulmonary trunk measuring up to 3.3 cm and reflux into of contrast into the hepatic veins concerning for pulmonary artery hypertension/right heart failure. Electronically Signed   By: Keane Police D.O.   On: 04/04/2021 12:22   CT Abdomen Pelvis W Contrast  Result Date: 03/29/2021 CLINICAL DATA:  Sepsis. EXAM: CT ABDOMEN AND PELVIS WITH CONTRAST TECHNIQUE: Multidetector CT imaging of the abdomen and pelvis was performed using the standard protocol  following bolus administration of intravenous contrast. CONTRAST:  46mL OMNIPAQUE IOHEXOL 350 MG/ML SOLN COMPARISON:  March 21, 2021. FINDINGS: Lower chest: Minimal bilateral pleural effusions are noted with adjacent subsegmental atelectasis. Hepatobiliary: Minimal cholelithiasis is noted. No biliary dilatation is noted. Hepatic steatosis is noted. Pancreas: Stable focal calcifications are seen in pancreatic head. No acute pancreatic inflammation is noted. Spleen: Normal in size without focal abnormality. Adrenals/Urinary Tract: Adrenal glands and kidneys appear normal. No hydronephrosis or renal obstruction is noted. No renal or ureteral calculi are noted. Mild urinary bladder distention is noted with several small calculi seen in its dependent portion. Stomach/Bowel: Stomach is within normal limits. Appendix appears normal. No evidence of bowel wall thickening, distention, or inflammatory changes. Vascular/Lymphatic: Aortic atherosclerosis. No enlarged abdominal or pelvic lymph nodes. Reproductive: Prostate is unremarkable. Other: No abdominal wall hernia or abnormality. No abdominopelvic ascites. Musculoskeletal: No acute or significant osseous findings. IMPRESSION: Minimal bilateral pleural effusions with adjacent subsegmental atelectasis. Hepatic steatosis. Minimal cholelithiasis. Mild urinary bladder distention is noted. Several small bladder calculi are noted. Aortic Atherosclerosis (ICD10-I70.0). Electronically Signed   By: Marijo Conception M.D.   On: 03/24/2021 11:21   DG Chest Port 1 View  Result Date: 03/28/2021 CLINICAL DATA:  Palpitations. EXAM: PORTABLE CHEST 1 VIEW COMPARISON:  03/21/2021 FINDINGS: Interstitial thickening in the upper lungs appears to represent chronic changes. No large areas of airspace disease or lung consolidation. Heart and mediastinum are within normal limits and stable. Surgical plate in the lower cervical spine. Negative for a pneumothorax. IMPRESSION: Chronic lung  changes without acute findings. Electronically Signed   By: Markus Daft M.D.   On: 03/19/2021 08:31        Scheduled Meds:  amiodarone  200 mg Oral Daily   DULoxetine  30 mg Oral Daily   gabapentin  600 mg Oral TID   mouth rinse  15 mL Mouth Rinse BID   metoprolol tartrate  50 mg Oral BID   rivaroxaban  20 mg Oral Daily   rosuvastatin  5 mg Oral Q supper   tamsulosin  0.4 mg  Oral QPM   Continuous Infusions:  diltiazem (CARDIZEM) infusion Stopped (04/13/2021 1300)     LOS: 1 day    Time spent: 35 mins.More than 50% of that time was spent in counseling and/or coordination of care.      Shelly Coss, MD [facility] P12/16/2022, 8:13 AM

## 2021-04-02 NOTE — TOC Progression Note (Signed)
Transition of Care [facility]) - Progression Note    Patient Details  Name: Austin Andrade MRN: [id number] Date of Birth: [birth year]  Transition of Care [facility]) CM/SW [place], [place] Phone Number: 04/02/2021, 4:50 PM  Clinical Narrative:      CSW provided patient with medicare compare SNF ratings list with SNF bed offers. Patient is reviewing. CSW will follow up with patient in the am on SNF choice. CSW will continue to follow and assist with patients dc planning needs.  Expected Discharge Plan: [place] Barriers to Discharge: Continued Medical Work up  Expected Discharge Plan and Services Expected Discharge Plan: [place] In-house Referral: Clinical Social Work     Living arrangements for the past 2 months: Single Family Home                                       Social Determinants of Health (SDOH) Interventions    Readmission Risk Interventions No flowsheet data found.

## 2021-04-02 NOTE — Progress Notes (Signed)
PHARMACY - PHYSICIAN COMMUNICATION CRITICAL VALUE ALERT - BLOOD CULTURE IDENTIFICATION (BCID)  Austin Andrade is an 70 y.o. male who presented to [facility] on 03/25/2021 with a chief complaint of palpitations  Assessment:   1/2 blood cultures growing Staphylococcus epidermidis--likely contaminant.    Name of physician (or Provider) Contacted:  Dr. Tawanna Solo  Current antibiotics: None  Changes to prescribed antibiotics recommended:  None at this time  Results for orders placed or performed during the hospital encounter of 04/13/2021  Blood Culture ID Panel (Reflexed) (Collected: 03/25/2021  9:00 AM)  Result Value Ref Range   Enterococcus faecalis NOT DETECTED NOT DETECTED   Enterococcus Faecium NOT DETECTED NOT DETECTED   Listeria monocytogenes NOT DETECTED NOT DETECTED   Staphylococcus species DETECTED (A) NOT DETECTED   Staphylococcus aureus (BCID) NOT DETECTED NOT DETECTED   Staphylococcus epidermidis DETECTED (A) NOT DETECTED   Staphylococcus lugdunensis NOT DETECTED NOT DETECTED   Streptococcus species NOT DETECTED NOT DETECTED   Streptococcus agalactiae NOT DETECTED NOT DETECTED   Streptococcus pneumoniae NOT DETECTED NOT DETECTED   Streptococcus pyogenes NOT DETECTED NOT DETECTED   A.calcoaceticus-baumannii NOT DETECTED NOT DETECTED   Bacteroides fragilis NOT DETECTED NOT DETECTED   Enterobacterales NOT DETECTED NOT DETECTED   Enterobacter cloacae complex NOT DETECTED NOT DETECTED   Escherichia coli NOT DETECTED NOT DETECTED   Klebsiella aerogenes NOT DETECTED NOT DETECTED   Klebsiella oxytoca NOT DETECTED NOT DETECTED   Klebsiella pneumoniae NOT DETECTED NOT DETECTED   Proteus species NOT DETECTED NOT DETECTED   Salmonella species NOT DETECTED NOT DETECTED   Serratia marcescens NOT DETECTED NOT DETECTED   Haemophilus influenzae NOT DETECTED NOT DETECTED   Neisseria meningitidis NOT DETECTED NOT DETECTED   Pseudomonas aeruginosa NOT DETECTED NOT DETECTED    Stenotrophomonas maltophilia NOT DETECTED NOT DETECTED   Candida albicans NOT DETECTED NOT DETECTED   Candida auris NOT DETECTED NOT DETECTED   Candida glabrata NOT DETECTED NOT DETECTED   Candida krusei NOT DETECTED NOT DETECTED   Candida parapsilosis NOT DETECTED NOT DETECTED   Candida tropicalis NOT DETECTED NOT DETECTED   Cryptococcus neoformans/gattii NOT DETECTED NOT DETECTED   Methicillin resistance mecA/C DETECTED (A) NOT DETECTED    Caryl Pina 04/02/2021  6:54 AM

## 2021-04-02 NOTE — Progress Notes (Signed)
OT Cancellation Note  Patient Details Name: Javiel Canepa MRN: [id number] DOB: [birth year]   Cancelled Treatment:    Reason Eval/Treat Not Completed: Patient declined, no reason specified.  OT to continue efforts as appropriate.    Shayne Diguglielmo D Kryssa Risenhoover 04/02/2021, 1:30 PM

## 2021-04-02 NOTE — Evaluation (Signed)
Physical Therapy Evaluation Patient Details Name: Austin Andrade MRN: [id number] DOB: 1950/12/09 Today's Date: 04/02/2021  History of Present Illness  Pt is a 70 y.o. male readmitted 04/01/20 for  A. fib with RVR with heart rates into the 200s started on Cardizem drip, also found to have Leukocytosis previoustly admitted from 12/4-12/12 with c/o of diarrhea and was found to be in A-fib with RVR with concern for possible diverticulitis/colitis. PMH includes: ischemic stroke (residual hemianopsia and balance deficits), L tibial fx s/p external fixation 10/30/20, chronic neck pain, paroxysmal a-fib, HLD, HTN, mitral regugitation, CHf, aneurysm.  Clinical Impression  PTA pt living in single story apartment with 2 steps to enter with room mate (however room mate is now in [place] hospital). Pt reports that while he was home since his last hospitalization he did limited ambulation with RW, was able to fix soup, but didn't eat much, and was only able to wash up. Pt is currently limited in safe mobility by 10/10 neck pain for which he was premedicated prior to therapy Eval. Pt supervision for safety to come to the EOB once there he only tolerated sitting for long enough for BP measurement before he swung his feet back into bed. Given pt's poor tolerance for mobility, PT recommending SNF level rehab at discharge to improve strength and mobility. PT will continue to follow acutely.       Recommendations for follow up therapy are one component of a multi-disciplinary discharge planning process, led by the attending physician.  Recommendations may be updated based on patient status, additional functional criteria and insurance authorization.  Follow Up Recommendations Skilled nursing-short term rehab (<3 hours/day)    Assistance Recommended at Discharge Frequent or constant Supervision/Assistance  Functional Status Assessment Patient has had a recent decline in their functional status and demonstrates the ability to  make significant improvements in function in a reasonable and predictable amount of time.  Equipment Recommendations  None recommended by PT    Recommendations for Other Services OT consult     Precautions / Restrictions Precautions Precautions: Fall Required Braces or Orthoses: Other Brace Other Brace: RLE cam walking boot Restrictions Weight Bearing Restrictions: No LLE Weight Bearing: Partial weight bearing LLE Partial Weight Bearing Percentage or Pounds: 50% Other Position/Activity Restrictions: Pt not sure when he is supposed to go back to ortho for follow up      Mobility  Bed Mobility Overal bed mobility: Needs Assistance Bed Mobility: Supine to Sit;Sit to Supine     Supine to sit: Supervision Sit to supine: Supervision   General bed mobility comments: supervision for coming to EoB, tolerates BP measurement and then returns himself to supine refusing further mobility      Balance Overall balance assessment: Needs assistance Sitting-balance support: No upper extremity supported;Feet unsupported Sitting balance-Leahy Scale: Good         Standing balance comment: refuses attempt to stand                             Pertinent Vitals/Pain Pain Assessment: 0-10 Pain Score: 10-Worst pain ever Pain Location: neck Pain Descriptors / Indicators: Aching;Discomfort;Grimacing;Tightness Pain Intervention(s): Limited activity within patient's tolerance;Monitored during session;Repositioned;Heat applied;Premedicated before session    Home Living Family/patient expects to be discharged to:: Private residence Living Arrangements: Other (Comment) (Roomate) Available Help at Discharge: Friend(s);Available PRN/intermittently Type of Home: Apartment Home Access: Level entry Entrance Stairs-Rails: None Entrance Stairs-Number of Steps: 2   Home Layout: One level Home  Equipment: Rollator (4 wheels);Tub bench Additional Comments: Pt has a roommate but he is  currently in the hospital. Does not have other people he can call if he needs help    Prior Function Prior Level of Function : Independent/Modified Independent             Mobility Comments: Pt ambulated with rollator in the home. Does not ambulate in the community. ADLs Comments: only able to bathe once since dischage        Extremity/Trunk Assessment        Lower Extremity Assessment Lower Extremity Assessment: RLE deficits/detail;LLE deficits/detail;Generalized weakness RLE: Unable to fully assess due to pain LLE: Unable to fully assess due to immobilization    Cervical / Trunk Assessment Cervical / Trunk Assessment: Other exceptions (prefers cervical flexion with chin on chest due to neck pain)  Communication   Communication: No difficulties  Cognition Arousal/Alertness: Awake/alert Behavior During Therapy: Flat affect Overall Cognitive Status: No family/caregiver present to determine baseline cognitive functioning Area of Impairment: Safety/judgement                     Memory: Decreased short-term memory   Safety/Judgement: Decreased awareness of safety;Decreased awareness of deficits     General Comments: pt very flat, answers direct questions but does not eloborate        General Comments General comments (skin integrity, edema, etc.): VSS on RA        Assessment/Plan    PT Assessment Patient needs continued PT services  PT Problem List Decreased strength;Decreased range of motion;Decreased activity tolerance;Decreased balance;Decreased mobility;Decreased safety awareness;Pain       PT Treatment Interventions DME instruction;Gait training;Stair training;Functional mobility training;Therapeutic activities;Therapeutic exercise;Balance training;Cognitive remediation;Patient/family education    PT Goals (Current goals can be found in the Care Plan section)  Acute Rehab PT Goals Patient Stated Goal: to have less pain PT Goal Formulation: With  patient Time For Goal Achievement: 04/06/21 Potential to Achieve Goals: Fair    Frequency Min 3X/week   Barriers to discharge Decreased caregiver support         AM-PAC PT "6 Clicks" Mobility  Outcome Measure Help needed turning from your back to your side while in a flat bed without using bedrails?: None Help needed moving from lying on your back to sitting on the side of a flat bed without using bedrails?: None Help needed moving to and from a bed to a chair (including a wheelchair)?: Total Help needed standing up from a chair using your arms (e.g., wheelchair or bedside chair)?: Total Help needed to walk in hospital room?: Total Help needed climbing 3-5 steps with a railing? : Total 6 Click Score: 12    End of Session Equipment Utilized During Treatment: Gait belt Activity Tolerance: Patient limited by pain Patient left: in bed;with call bell/phone within reach;with bed alarm set Nurse Communication: Mobility status PT Visit Diagnosis: Muscle weakness (generalized) (M62.81);History of falling (Z91.81);Difficulty in walking, not elsewhere classified (R26.2);Unsteadiness on feet (R26.81)    Time: 6644-0347 PT Time Calculation (min) (ACUTE ONLY): 20 min   Charges:   PT Evaluation $PT Eval Moderate Complexity: 1 Mod          Austin Andrade B. Migdalia Dk PT, DPT Acute Rehabilitation Services Pager [phone] Office [phone]   [place] 04/02/2021, 11:50 AM

## 2021-04-03 DIAGNOSIS — I495 Sick sinus syndrome: Secondary | ICD-10-CM | POA: Diagnosis present

## 2021-04-03 DIAGNOSIS — I442 Atrioventricular block, complete: Secondary | ICD-10-CM | POA: Diagnosis present

## 2021-04-04 LAB — CULTURE, BLOOD (ROUTINE X 2): Special Requests: ADEQUATE

## 2021-04-06 LAB — CULTURE, BLOOD (ROUTINE X 2): Culture: NO GROWTH

## 2021-04-18 NOTE — Progress Notes (Incomplete Revision)
[place].es of nausea .MD on call was called & ordered Zofran 4mg  IV prn for nausea./vomiting.

## 2021-04-18 NOTE — Death Summary Note (Addendum)
DEATH SUMMARY   Patient Details  Name: Austin Andrade MRN: [id number] DOB: [birth year]  Admission/Discharge Information   Admit Date:  04-18-2021  Date of Death: Date of Death: 04-20-2021  Time of Death: Time of Death: 0000  Length of Stay: 2  Code Status:     Code Status Orders  (From admission, onward)           Start     Ordered   Apr 18, 2021 1032  Do not attempt resuscitation/DNR  Continuous       Question Answer Comment  In the event of cardiac or respiratory ARREST Do not call a code blue   In the event of cardiac or respiratory ARREST Do not perform Intubation, CPR, defibrillation or ACLS   In the event of cardiac or respiratory ARREST Use medication by any route, position, wound care, and other measures to relive pain and suffering. May use oxygen, suction and manual treatment of airway obstruction as needed for comfort.      April 18, 2021 1031            Referring Physician: Sandi Mariscal, MD    Reason(s) for Hospitalization  Palpitations secondary to Atrial Fibrillation with Rapid Ventricular Response  Diagnoses  Preliminary cause of death:  Secondary Diagnoses (including complications and co-morbidities):  Principal Problem:   Sinoatrial node dysfunction ([place]) Active Problems:  Elevated troponin due to demand ischemia   Hyperlipidemia with target LDL less than 100   Leukocytosis   Generalized weakness   BPH (benign prostatic hyperplasia)   Complete heart block ([facility])   Atrial fibrillation with RVR ([facility])   Elevated troponin   Hypertension    Brief Hospital Course    71 year old male with past medical history of paroxysmal atrial fibrillation, hypertension, hyperlipidemia, mitral regurgitation, BPH who presented to [facility] emergency department on April 19, 2023 with complaints of palpitations.  This occurred shortly after hospitalization from 12/4 until 12/12.  For patient's continued rapid upon evaluation in the emergency department patient was found to  be in rapid atrial fibrillation.  Patient was initiated on an intravenous Cardizem drip in addition to oral amiodarone.  Patient was admitted to the progressive unit for further management and cardiology was consulted.  Patient eventually achieved rate control and diltiazem infusion was discontinued.  Patient was continued on amiodarone and Lopressor with Xarelto for anticoagulation.  The evening of 12/16 the patient began to complain of bouts of nausea with observed periods of lethargy.  At approximately 2350 the patient began to exhibit bouts of second and third-degree heart block on cardiac telemetry.  This unfortunately rapidly progressed leading to the patient losing his pulse and ultimately expiring.    Nursing promptly evaluated the patient at the bedside and confirmed.  Patient was pronounced by 2 RNs with time of death noted to be 12 AM on 04/21/23.  As patient was was in possession of a bottle of his recent prescription of oxycodone found in his pocket with only 82 of the 120 pill prescription present.  This was particularly concerning considering the medication has been filled on 12/6 and the patient was supposed to be hospitalized from 12/4 until 12/12 and was once again hospitalized on 04-19-23.    This finding was discussed with the medical examiner office and they felt that the patient did not require a medical examiner referral if drug overdose was not felt to be the cause of the patient's demise.  Ultimately was felt that the patient had expired due to sinoatrial dysfunction associated with  the patient's atrial fibrillation.  Attempts were made to contact the only 2 friends listed on the patient's facesheet and unfortunately neither answered.  There has been no next of kin identified.    Austin Andrade May 03, 2021, 3:45 AM

## 2021-04-18 NOTE — Progress Notes (Addendum)
[place].es of nausea .MD on call was called & ordered Zofran 4mg  IV prn for nausea./vomiting.

## 2021-04-18 NOTE — Progress Notes (Signed)
At around 2350 ;it was noted in heart monitor that his HR went down in the 30's ,went to pt's room & pt.is pale looking & unresponsive .checked v/s B/P =70/30; noted pulse is weak  & progressively  no pulse felt.Dr.Shalhoub was called & made aware.While doing the post mortem care ,found a bottle of Oxycodone in pt's pocket with only 82 of the 120pills.Dr.Shalhoub was also made aware of the findings.Tried to call the person listed in pt's contact information but  to no avail.Left a message to the answering machine.Medication sent to the pharmacy.Patient's belongings sent with patient in the morgue.

## 2021-04-18 DEATH — deceased

## 2022-07-05 IMAGING — DX DG CHEST 1V PORT
1 series · 1 of 1 positions shown · non-contrast
Comparison: 03/21/2021

CLINICAL DATA: Palpitations.

EXAM:
PORTABLE CHEST 1 VIEW

[chest ap]
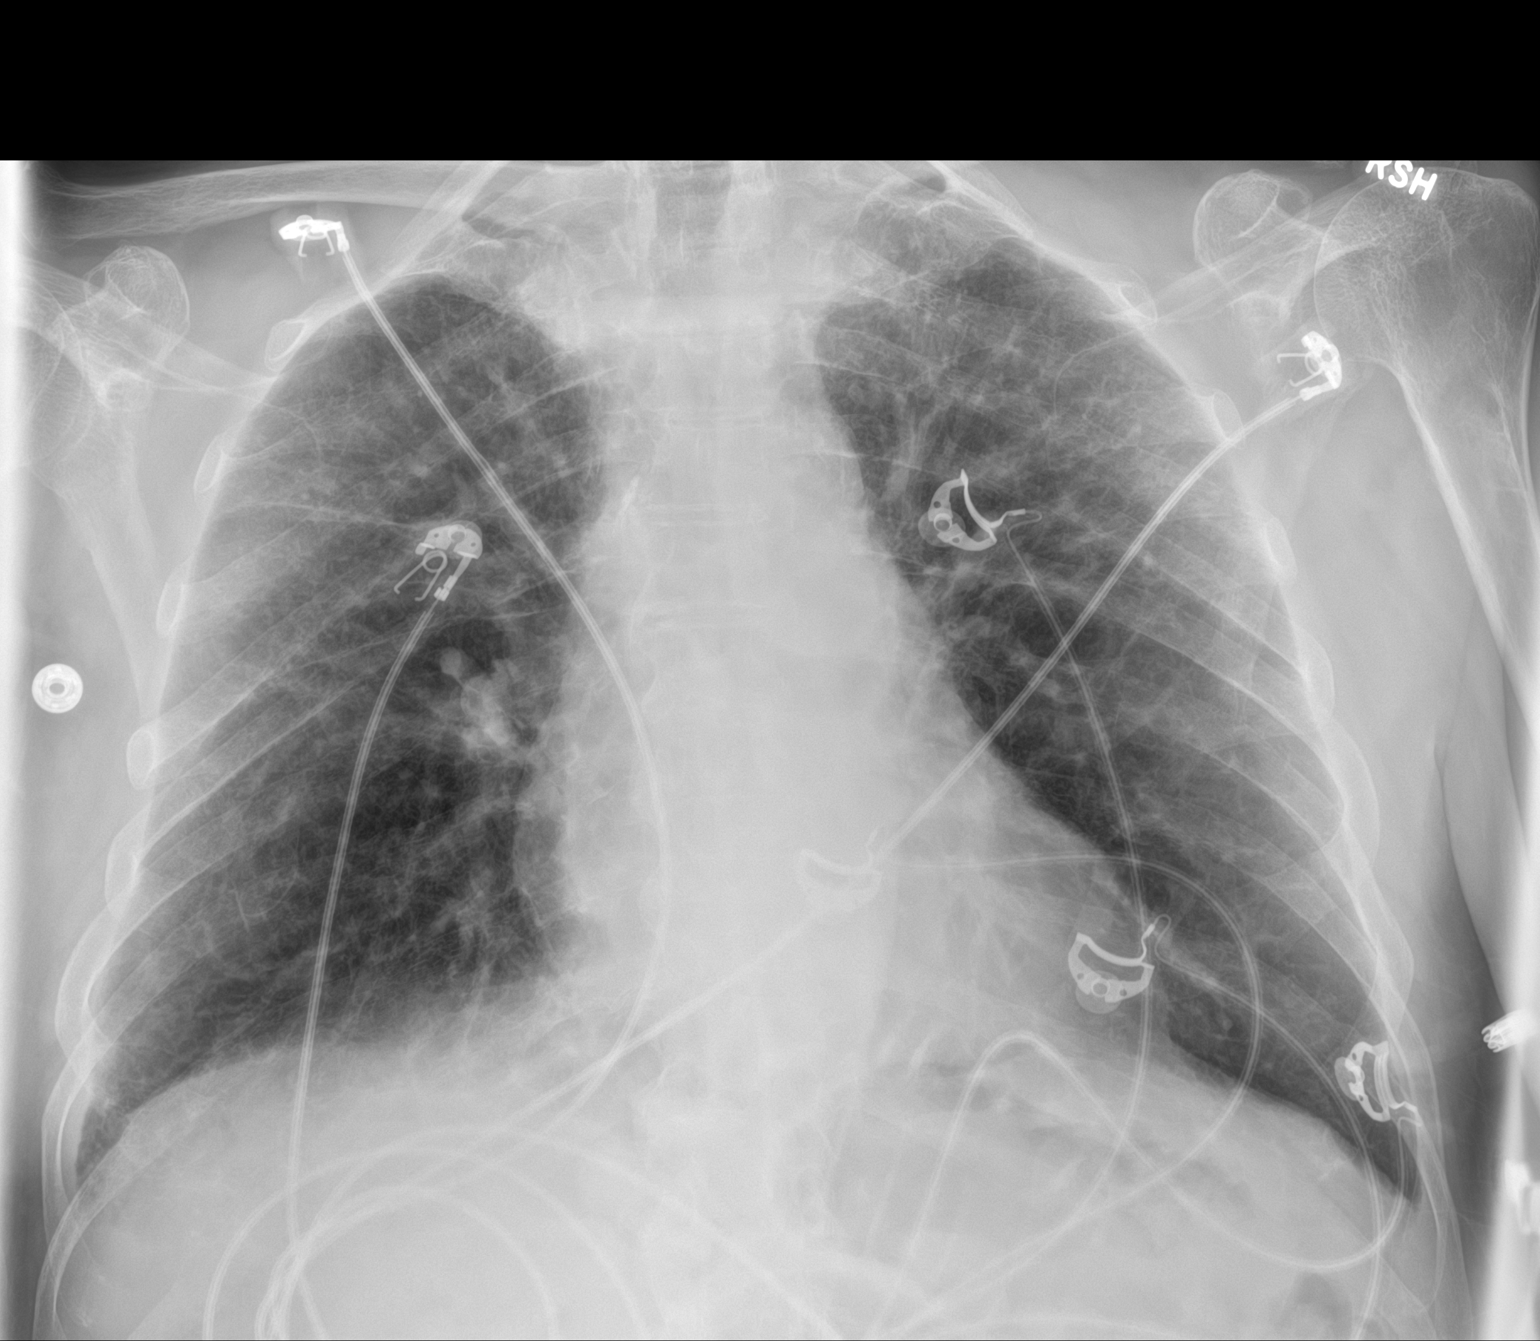

[1 of 1 positions shown; findings below may reference images not displayed]

FINDINGS: Interstitial thickening in the upper lungs appears to represent
chronic changes. No large areas of airspace disease or lung
consolidation. Heart and mediastinum are within normal limits and
stable. Surgical plate in the lower cervical spine. Negative for a
pneumothorax.
IMPRESSION: Chronic lung changes without acute findings.

## 2022-07-05 IMAGING — CT CT ANGIO CHEST
2 of 6 series · 18 of 36 positions shown · IV contrast (omnipaque)
Comparison: CT examination dated November 08, 2020.

CLINICAL DATA: Pulmonary embolism suspected.  Tachycardia

EXAM:
CT ANGIOGRAPHY CHEST WITH CONTRAST
TECHNIQUE: Multidetector CT imaging of the chest was performed using the
standard protocol during bolus administration of intravenous
contrast. Multiplanar CT image reconstructions and MIPs were
obtained to evaluate the vascular anatomy.
CONTRAST:  80mL OMNIPAQUE IOHEXOL 350 MG/ML SOLN

[Series 5: pe thins · axial · 0.79mm/px · z∈[+1137,+1422]mm · 17 of 321 slices shown]
[im 18/321  lung]
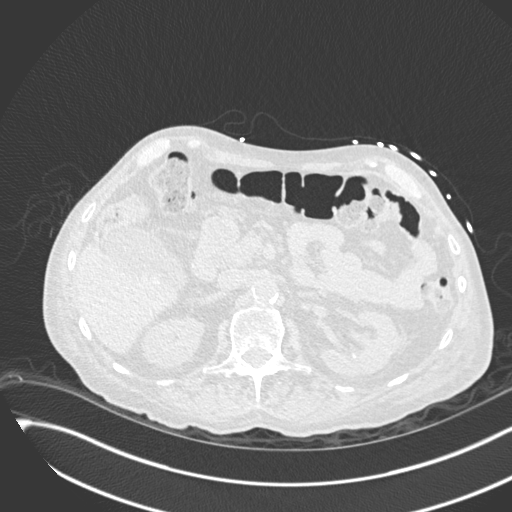
[im 36/321  mediastinal]
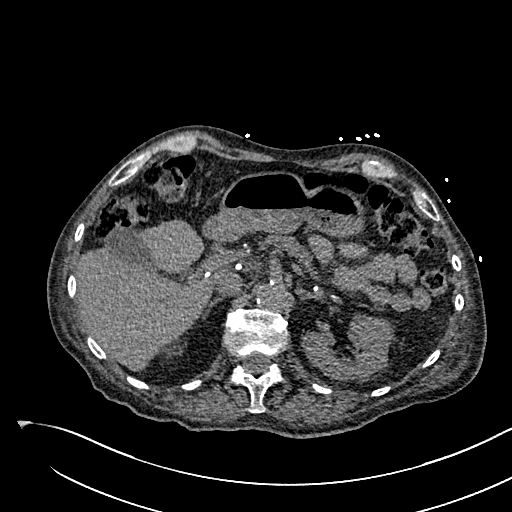
[im 54/321  lung]
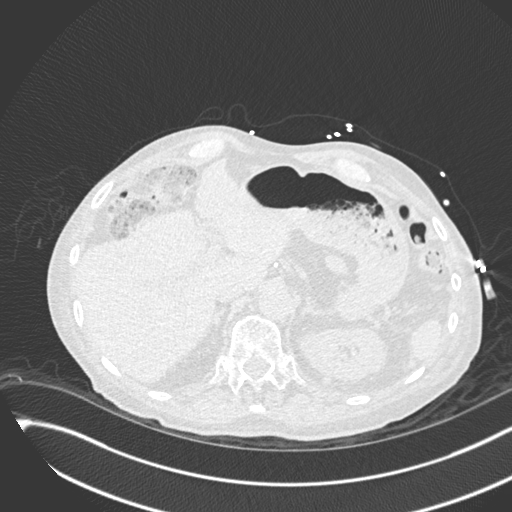
[im 72/321  mediastinal]
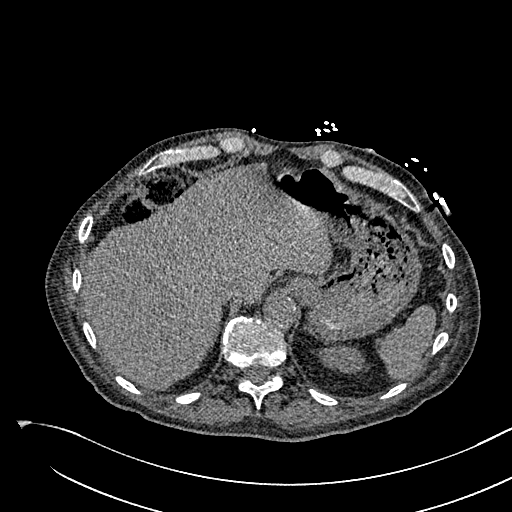
[im 89/321  lung]
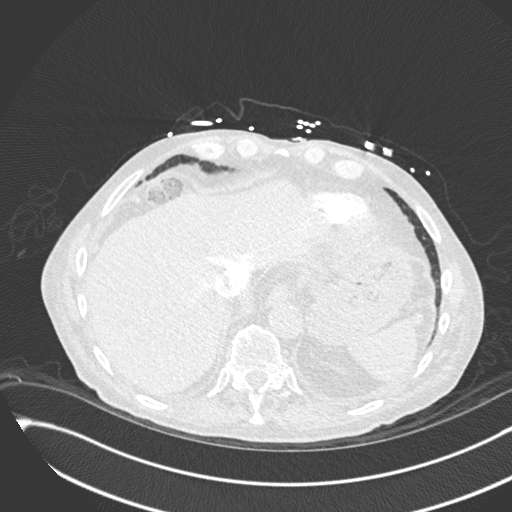
[im 107/321  mediastinal]
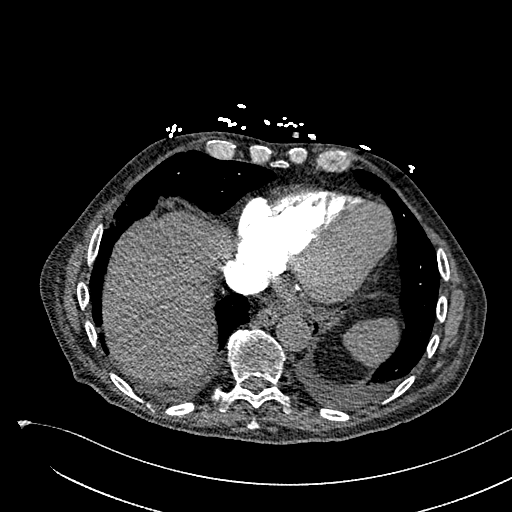
[im 125/321  lung]
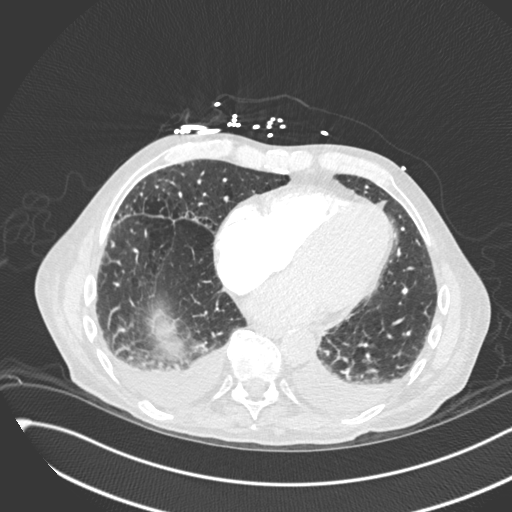
[im 143/321  mediastinal]
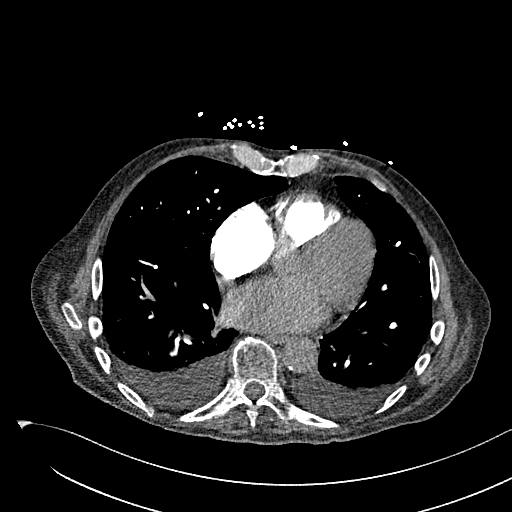
[im 161/321  lung]
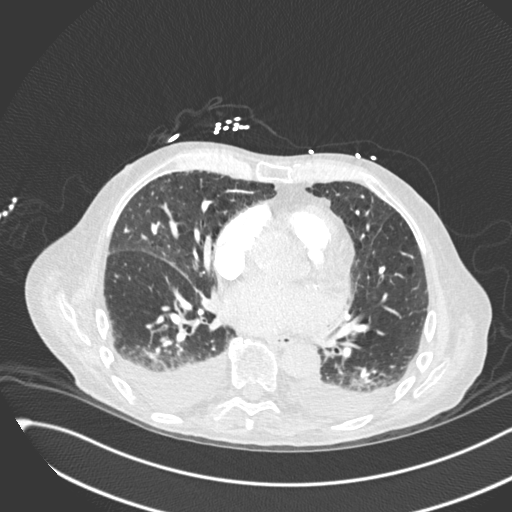
[im 178/321  mediastinal]
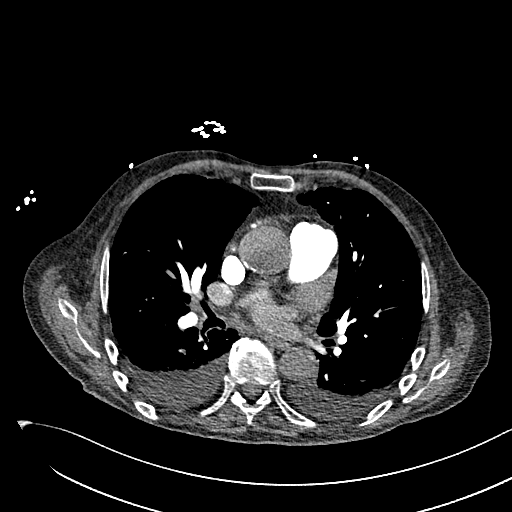
[im 196/321  lung]
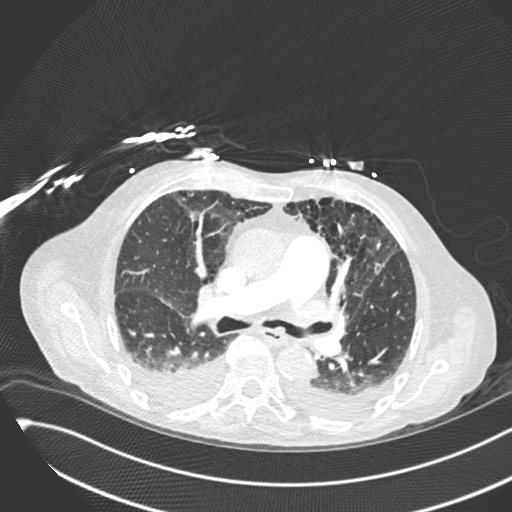
[im 214/321  mediastinal]
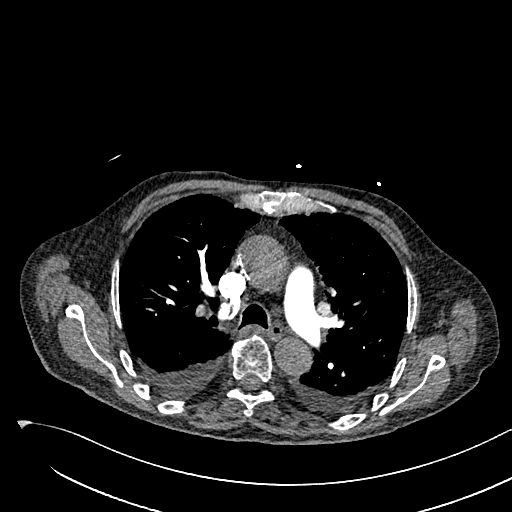
[im 232/321  lung]
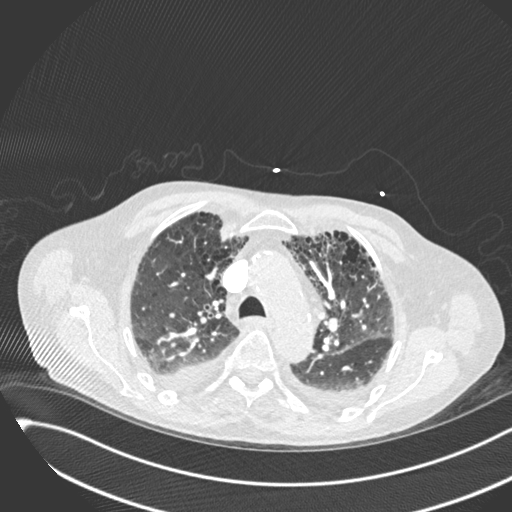
[im 249/321  mediastinal]
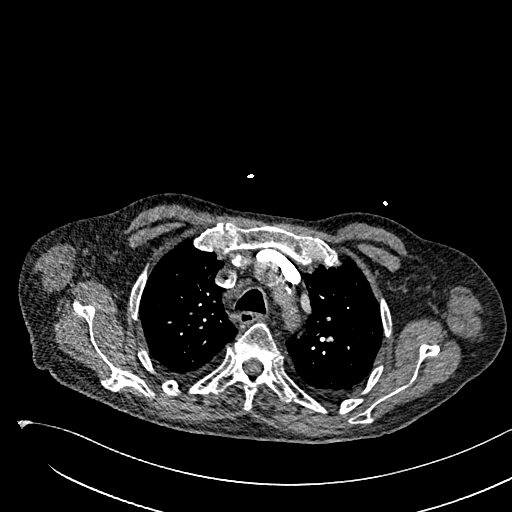
[im 267/321  lung]
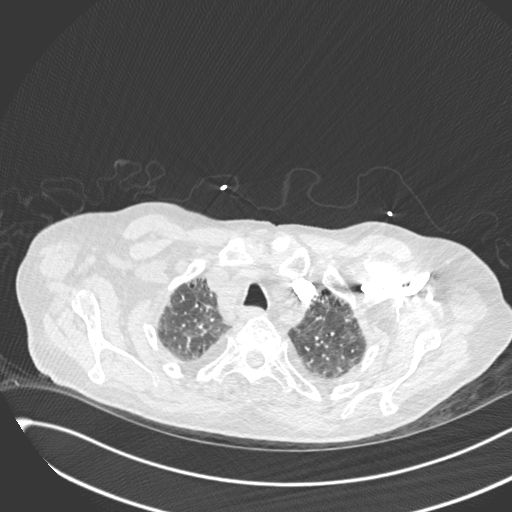
[im 285/321  mediastinal]
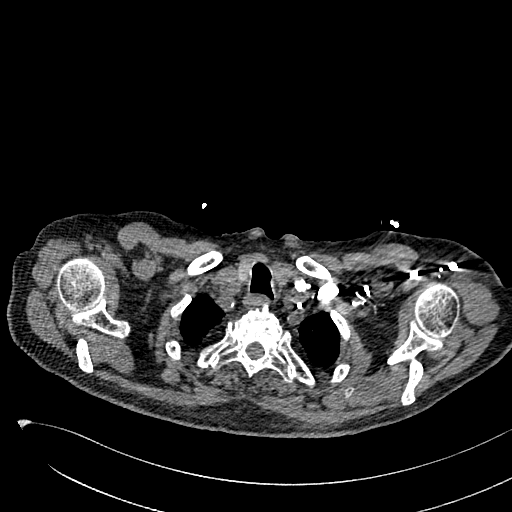
[im 303/321  lung]
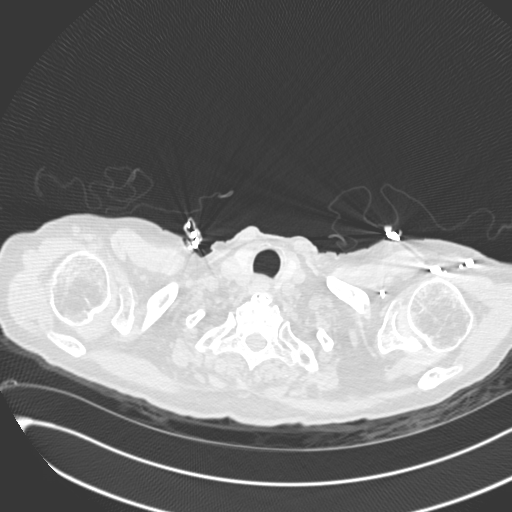

[Series 6: pe 2mm cor · coronal · 0.63mm/px · 1 of 145 slices shown]
[im 73/145  mediastinal]
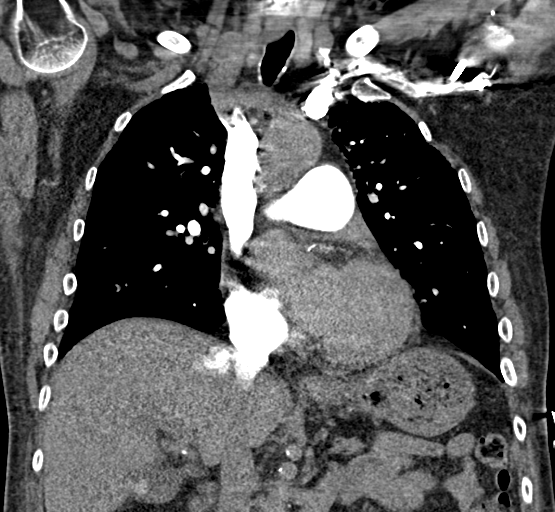

[18 of 36 positions shown; findings below may reference images not displayed]

FINDINGS: Cardiovascular: Satisfactory opacification of the pulmonary arteries
to the segmental level. No evidence of pulmonary embolism. Heart is
mildly enlarged. No pericardial effusion. Mild coronary artery
atherosclerotic calcifications. Main pulmonary trunk is mildly
dilated measuring up to 3.3 cm. There is also reflux of contrast
into the hepatic veins concerning for pulmonary arterial
hypertension/right heart failure.

Mediastinum/Nodes: No enlarged mediastinal, hilar, or axillary lymph
nodes. Thyroid gland, trachea, and esophagus demonstrate no
significant findings.

Lungs/Pleura: Centrilobular emphysematous changes prominent in the
bilateral upper lobes. Small bilateral pleural effusions. Bibasilar
dependent atelectasis. No evidence of pneumonia or pulmonary edema.

Upper Abdomen: No acute abnormality.

Musculoskeletal: No chest wall abnormality. No acute or significant
osseous findings.

Review of the MIP images confirms the above findings.
IMPRESSION: 1.  No evidence of pulmonary embolism.

2.  Moderate emphysematous changes.  No evidence of pneumonia.

3.  Small bilateral pleural effusions.

4. Dilated main pulmonary trunk measuring up to 3.3 cm and reflux
into of contrast into the hepatic veins concerning for pulmonary
artery hypertension/right heart failure.
